# Patient Record
Sex: Female | Born: 1965 | Race: White | Hispanic: No | Smoking: Former smoker
Health system: Southern US, Community
[De-identification: ages and names within clinical notes are randomized; demographics above are authoritative.]

## PROBLEM LIST (undated history)

## (undated) DIAGNOSIS — E559 Vitamin D deficiency, unspecified: Secondary | ICD-10-CM

## (undated) DIAGNOSIS — M1990 Unspecified osteoarthritis, unspecified site: Secondary | ICD-10-CM

## (undated) DIAGNOSIS — E669 Obesity, unspecified: Secondary | ICD-10-CM

## (undated) DIAGNOSIS — R232 Flushing: Secondary | ICD-10-CM

## (undated) DIAGNOSIS — I8001 Phlebitis and thrombophlebitis of superficial vessels of right lower extremity: Secondary | ICD-10-CM

## (undated) DIAGNOSIS — L719 Rosacea, unspecified: Secondary | ICD-10-CM

## (undated) DIAGNOSIS — E785 Hyperlipidemia, unspecified: Secondary | ICD-10-CM

## (undated) DIAGNOSIS — G479 Sleep disorder, unspecified: Secondary | ICD-10-CM

## (undated) DIAGNOSIS — M2550 Pain in unspecified joint: Secondary | ICD-10-CM

## (undated) DIAGNOSIS — E041 Nontoxic single thyroid nodule: Secondary | ICD-10-CM

## (undated) DIAGNOSIS — I8390 Asymptomatic varicose veins of unspecified lower extremity: Secondary | ICD-10-CM

## (undated) DIAGNOSIS — M25569 Pain in unspecified knee: Secondary | ICD-10-CM

## (undated) HISTORY — PX: TUBAL LIGATION: SHX77

## (undated) HISTORY — DX: Asymptomatic varicose veins of unspecified lower extremity: I83.90

## (undated) HISTORY — DX: Hyperlipidemia, unspecified: E78.5

## (undated) HISTORY — DX: Rosacea, unspecified: L71.9

## (undated) HISTORY — PX: VARICOSE VEIN SURGERY: SHX832

## (undated) HISTORY — DX: Unspecified osteoarthritis, unspecified site: M19.90

## (undated) HISTORY — PX: UTERINE FIBROID SURGERY: SHX826

## (undated) HISTORY — DX: Flushing: R23.2

## (undated) HISTORY — DX: Sleep disorder, unspecified: G47.9

## (undated) HISTORY — DX: Pain in unspecified joint: M25.50

## (undated) HISTORY — DX: Vitamin D deficiency, unspecified: E55.9

## (undated) HISTORY — DX: Obesity, unspecified: E66.9

## (undated) HISTORY — DX: Pain in unspecified knee: M25.569

## (undated) HISTORY — DX: Phlebitis and thrombophlebitis of superficial vessels of right lower extremity: I80.01

## (undated) HISTORY — DX: Nontoxic single thyroid nodule: E04.1

---

## 2012-01-14 DIAGNOSIS — I83819 Varicose veins of unspecified lower extremities with pain: Secondary | ICD-10-CM | POA: Insufficient documentation

## 2014-08-02 ENCOUNTER — Other Ambulatory Visit (HOSPITAL_COMMUNITY): Payer: Self-pay | Admitting: Chiropractic Medicine

## 2014-08-02 ENCOUNTER — Ambulatory Visit (HOSPITAL_COMMUNITY)
Admission: RE | Admit: 2014-08-02 | Discharge: 2014-08-02 | Disposition: A | Discharge status: Home / Self Care | Payer: 59 | Source: Ambulatory Visit | Attending: Chiropractic Medicine | Admitting: Chiropractic Medicine

## 2014-08-02 DIAGNOSIS — M5442 Lumbago with sciatica, left side: Secondary | ICD-10-CM | POA: Insufficient documentation

## 2014-09-27 ENCOUNTER — Encounter: Payer: Self-pay | Admitting: *Deleted

## 2014-09-27 ENCOUNTER — Emergency Department
Admission: EM | Admit: 2014-09-27 | Discharge: 2014-09-27 | Disposition: A | Discharge status: Home / Self Care | Payer: 59 | Source: Home / Self Care | Attending: Emergency Medicine | Admitting: Emergency Medicine

## 2014-09-27 DIAGNOSIS — J069 Acute upper respiratory infection, unspecified: Secondary | ICD-10-CM

## 2014-09-27 MED ORDER — AMOXICILLIN 875 MG PO TABS: ORAL_TABLET | ORAL | Status: DC

## 2014-09-27 NOTE — ED Notes (Signed)
Pt c/o nasal congestion, HA, sinus pressure, and fatigue x 3 days. Denies fever.

## 2014-09-27 NOTE — ED Provider Notes (Signed)
CSN: [id number]     Arrival date & time 09/27/14  1644 History   First MD Initiated Contact with Patient 09/27/14 1710     Chief Complaint  Patient presents with  . Headache  . Nasal Congestion   (Consider location/radiation/quality/duration/timing/severity/associated sxs/prior Treatment) HPI URI HISTORY  Alicia Payne is a 48 y.o. female who complains of onset of cold symptoms for 4 days.  Have been using over-the-counter treatment , such as decongestant and expectorant, and that's helping somewhat.  No chills/sweats No definite Fever  +  Nasal congestion Mild Discolored Post-nasal drainage Mild sinus pain/pressure No sore throat  No cough No wheezing No chest congestion No hemoptysis No shortness of breath No pleuritic pain  No itchy/red eyes Mild bilateral earache  No nausea No vomiting No abdominal pain No diarrhea  No skin rashes +  Fatigue No myalgias No headache   History reviewed. No pertinent past medical history. Past Surgical History  Procedure Laterality Date  . Uterine fibroid surgery    . Tubal ligation     Family History  Problem Relation Age of Onset  . Cancer Mother     thyroid   History  Substance Use Topics  . Smoking status: Former Research scientist (life sciences)  . Smokeless tobacco: Not on file  . Alcohol Use: Yes   OB History    No data available     Review of Systems  All other systems reviewed and are negative.   Allergies  Review of patient's allergies indicates no known allergies.  Home Medications   Prior to Admission medications   Medication Sig Start Date End Date Taking? Authorizing Provider  amoxicillin (AMOXIL) 875 MG tablet Take 1 twice a day X 10 days. 09/27/14   Jacqulyn Cane, MD   BP 138/88 mmHg  Pulse 60  Temp(Src) 97.7 F (36.5 C) (Oral)  Resp 18  Ht 5\' 7"  (1.702 m)  Wt 238 lb (107.956 kg)  BMI 37.27 kg/m2  SpO2 98%  LMP 09/15/2014 Physical Exam  Constitutional: She is oriented to person, place, and time. She appears  well-developed and well-nourished. No distress.  HENT:  Head: Normocephalic and atraumatic.  Right Ear: External ear normal.  Left Ear: External ear normal.  Nose: Nose normal.  Mouth/Throat: Oropharynx is clear and moist.  TMs normal. Nose with minimally boggy terminates minimal serous drainage. No maxillary or frontal or facial tenderness  Eyes: Conjunctivae and EOM are normal. Pupils are equal, round, and reactive to light. Right eye exhibits no discharge. Left eye exhibits no discharge. No scleral icterus.  Neck: Normal range of motion. Neck supple.  Cardiovascular: Normal rate, regular rhythm and normal heart sounds.   Pulmonary/Chest: Effort normal and breath sounds normal. No respiratory distress. She has no wheezes. She has no rales.  Abdominal: She exhibits no distension.  Musculoskeletal: Normal range of motion. She exhibits no edema or tenderness.  Lymphadenopathy:    She has no cervical adenopathy.  Neurological: She is alert and oriented to person, place, and time. No cranial nerve deficit.  Skin: Skin is warm. No rash noted.  Psychiatric: She has a normal mood and affect.  Nursing note and vitals reviewed.   ED Course  Procedures (including critical care time) Labs Review Labs Reviewed - No data to display  Imaging Review No results found.   MDM   1. Acute upper respiratory infection    Likely viral. Treatment options discussed, as well as risks, benefits, alternatives. Patient voiced understanding and agreement with the following plans: Discussed symptomatic  care. Continue decongestant and Mucinex and rest and pushing fluids. Tylenol or ibuprofen for pain or fever If not improving in 2-3 days, fill prescription I gave her for amoxicillin. Follow-up with your primary care doctor in 5-7 days if not improving, or sooner if symptoms become worse. Precautions discussed. Red flags discussed. Questions invited and answered. Patient voiced understanding and  agreement.     Jacqulyn Cane, MD 09/27/14 [phone]

## 2014-10-26 ENCOUNTER — Other Ambulatory Visit: Payer: Self-pay | Admitting: Internal Medicine

## 2014-10-26 DIAGNOSIS — E042 Nontoxic multinodular goiter: Secondary | ICD-10-CM

## 2014-10-26 DIAGNOSIS — E041 Nontoxic single thyroid nodule: Secondary | ICD-10-CM

## 2014-11-01 ENCOUNTER — Ambulatory Visit (INDEPENDENT_AMBULATORY_CARE_PROVIDER_SITE_OTHER): Payer: 59

## 2014-11-01 DIAGNOSIS — E041 Nontoxic single thyroid nodule: Secondary | ICD-10-CM

## 2014-12-15 ENCOUNTER — Emergency Department
Admission: EM | Admit: 2014-12-15 | Discharge: 2014-12-15 | Disposition: A | Discharge status: Home / Self Care | Payer: 59 | Source: Home / Self Care | Attending: Family Medicine | Admitting: Family Medicine

## 2014-12-15 ENCOUNTER — Encounter: Payer: Self-pay | Admitting: *Deleted

## 2014-12-15 DIAGNOSIS — H04213 Epiphora due to excess lacrimation, bilateral lacrimal glands: Secondary | ICD-10-CM | POA: Diagnosis not present

## 2014-12-15 MED ORDER — NEDOCROMIL SODIUM 2 % OP SOLN: OPHTHALMIC | Status: DC

## 2014-12-15 NOTE — ED Notes (Signed)
Pt c/o bilateral eye redness and watery x 1 mth. She has tried OTC antihistamines and eye gtts without relief.

## 2014-12-15 NOTE — ED Provider Notes (Signed)
CSN: [id number]     Arrival date & time 12/15/14  1640 History   First MD Initiated Contact with Patient 12/15/14 1655     Chief Complaint  Patient presents with  . Eye Problem      HPI Comments: Patient reports a one month history of persistent excessive lacrimation equally in both eyes, to the extent that tears run down her cheeks.  Recently she has developed itching and mild swelling in her lower eyelids because of the excess tears at night.  She has no eye pain or foreign body sensation.  No recent URI or sinusitis.  ?history of seasonal allergies.  She has had no improvement with OTC vasoconstrictor and antihistamine drops.  She denies facial rash.  Patient is a 49 y.o. female presenting with eye problem. The history is provided by the patient.  Eye Problem Location:  Both Quality:  Tearing Severity:  Moderate Onset quality:  Sudden Duration:  1 month Timing:  Constant Progression:  Worsening Chronicity:  New Relieved by:  Nothing Worsened by:  Nothing tried Ineffective treatments:  Eye drops Associated symptoms: itching and redness   Associated symptoms: no blurred vision, no crusting, no decreased vision, no discharge, no double vision, no facial rash, no foreign body sensation, no headaches, no nausea, no photophobia, no scotomas, no swelling and no tearing   Risk factors: not exposed to pinkeye and no recent URI     History reviewed. No pertinent past medical history. Past Surgical History  Procedure Laterality Date  . Uterine fibroid surgery    . Tubal ligation    . Varicose vein surgery     Family History  Problem Relation Age of Onset  . Cancer Mother     thyroid   History  Substance Use Topics  . Smoking status: Former Research scientist (life sciences)  . Smokeless tobacco: Not on file  . Alcohol Use: Yes   OB History    No data available     Review of Systems  Eyes: Positive for redness and itching. Negative for blurred vision, double vision, photophobia and discharge.    Gastrointestinal: Negative for nausea.  Neurological: Negative for headaches.  All other systems reviewed and are negative.   Allergies  Review of patient's allergies indicates no known allergies.  Home Medications   Prior to Admission medications   Medication Sig Start Date End Date Taking? Authorizing Provider  amoxicillin (AMOXIL) 875 MG tablet Take 1 twice a day X 10 days. 09/27/14   Jacqulyn Cane, MD  nedocromil (ALOCRIL) 2 % ophthalmic solution Apply one or two gtts OU BID 12/15/14   Kandra Nicolas, MD   BP 134/84 mmHg  Pulse 74  Temp(Src) 98.2 F (36.8 C) (Oral)  Resp 18  Ht 5\' 8"  (1.727 m)  Wt 237 lb (107.502 kg)  BMI 36.04 kg/m2  SpO2 97%  LMP 11/20/2014 Physical Exam  Constitutional: She appears well-developed and well-nourished. No distress.  HENT:  Head: Normocephalic.  Nose: Nose normal.  Mouth/Throat: Oropharynx is clear and moist.  Eyes: EOM are normal. Pupils are equal, round, and reactive to light. Right eye exhibits no discharge and no exudate. Left eye exhibits no discharge, no exudate and no hordeolum. No foreign body present in the left eye. Right conjunctiva is injected. Left conjunctiva is injected.    Both eyes have similar, but minimal hyperemia.  No photophobia.  Fundi benign.  No discharge noted. Both lower lids slightly erythematous and swollen but no tenderness to palpation. No distinct facial erythema. Fluorescein  to left eye shows no uptake.  Neck: Neck supple.  Lymphadenopathy:    She has no cervical adenopathy.  Nursing note and vitals reviewed.   ED Course  Procedures  none   MDM   1. Epiphora due to excess lacrimation of both sides; ?etiology.  ?allergic    Begin trial of Alocril ophthalmic solution BID May apply thin film of 1% hydrocortisone cream or ointment to cheeks below eyes to control itching and redness. Followup with ophthalmologist next week as scheduled    Kandra Nicolas, MD 12/15/14 1743

## 2014-12-15 NOTE — Discharge Instructions (Signed)
May apply thin film of 1% hydrocortisone cream or ointment to cheeks below eyes to control itching and redness.

## 2015-06-28 ENCOUNTER — Ambulatory Visit: Payer: Self-pay | Admitting: Allergy and Immunology

## 2015-06-28 ENCOUNTER — Encounter (INDEPENDENT_AMBULATORY_CARE_PROVIDER_SITE_OTHER): Payer: Self-pay

## 2015-06-28 ENCOUNTER — Encounter: Payer: Self-pay | Admitting: Allergy and Immunology

## 2015-06-28 ENCOUNTER — Ambulatory Visit (INDEPENDENT_AMBULATORY_CARE_PROVIDER_SITE_OTHER): Payer: 59 | Admitting: Allergy and Immunology

## 2015-06-28 VITALS — BP 110/82 | HR 80 | Resp 16

## 2015-06-28 DIAGNOSIS — L718 Other rosacea: Secondary | ICD-10-CM

## 2015-06-28 DIAGNOSIS — H1045 Other chronic allergic conjunctivitis: Secondary | ICD-10-CM | POA: Diagnosis not present

## 2015-06-28 DIAGNOSIS — H1010 Acute atopic conjunctivitis, unspecified eye: Secondary | ICD-10-CM | POA: Insufficient documentation

## 2015-06-28 NOTE — Progress Notes (Signed)
Subjective:   Patient ID: Alicia Payne is a 49 y.o. female.   HPI:  Problem  Seasonal Allergic Conjunctivitis   She returns today stating that she once again developed significant redness and watering of eyes while using doxycycline. Alrex did not appear to help. She stopped the doxycycline within a few days of the flare up. No other symptoms    Ocular Rosacea    No past medical history on file.  Past Surgical History  Procedure Laterality Date  . Uterine fibroid surgery    . Tubal ligation    . Varicose vein surgery        Medication List       This list is accurate as of: 06/28/15  6:06 PM.  Always use your most recent med list.               ALREX 0.2 % Susp  Generic drug:  loteprednol  Place 1 drop into both eyes as needed.     amoxicillin 875 MG tablet  Commonly known as:  AMOXIL  Take 1 twice a day X 10 days.     CALCIUM + D PO  Take 1 tablet by mouth daily.     FISH OIL PO  Take 1 capsule by mouth daily.     nedocromil 2 % ophthalmic solution  Commonly known as:  ALOCRIL  Apply one or two gtts OU BID        No Known Allergies  Social History   Social History  . Marital Status: Married    Spouse Name: N/A  . Number of Children: N/A  . Years of Education: N/A   Occupational History  . Not on file.   Social History Main Topics  . Smoking status: Former Research scientist (life sciences)  . Smokeless tobacco: Not on file  . Alcohol Use: Yes  . Drug Use: No  . Sexual Activity: Not on file   Other Topics Concern  . Not on file   Social History Narrative         Review of Systems  HENT: Negative for congestion, ear pain, hearing loss, nosebleeds, sore throat and tinnitus.   Eyes: Negative for redness.  Skin: Negative for itching and rash.  Neurological: Negative for headaches.    Objective:   Filed Vitals:   06/28/15 1732  BP: 110/82  Pulse: 80  Resp: 16    Physical Exam  HENT:  Head: Normocephalic.  Right Ear: External ear normal.   Left Ear: External ear normal.  Nose: Nose normal.  Mouth/Throat: Oropharynx is clear and moist.  Eyes: Right eye exhibits no chemosis, no discharge and no exudate. No foreign body present in the right eye. Left eye exhibits no chemosis, no discharge and no exudate. No foreign body present in the left eye. Right conjunctiva is not injected. Left conjunctiva is not injected. Right eye exhibits normal extraocular motion. Left eye exhibits normal extraocular motion.         Assessment and Plan:   Problem List Items Addressed This Visit      Musculoskeletal and Integument   Ocular rosacea    1. Revisit with eye doctor to assess for dry eye syndrome 2. Continue Doxycycline and alrex. 3. If no dry eye then will consider dermatology visit for further treatment of rosacea.         Other   Seasonal allergic conjunctivitis - Primary    I think the decision point here is whether or not she has dry eye syndrome.  If so, then she needs restasis and if not then she need further treatment for ocular rosacea.   1. Revisit with eye doctor to assess for dry eye syndrome 2. Continue Doxycycline and alrex. 3. If no dry eye then will consider dermatology visit for further treatment of rosacea.

## 2015-06-28 NOTE — Assessment & Plan Note (Signed)
1. Revisit with eye doctor to assess for dry eye syndrome 2. Continue Doxycycline and alrex. 3. If no dry eye then will consider dermatology visit for further treatment of rosacea.

## 2015-06-28 NOTE — Patient Instructions (Signed)
1. Revisit with eye doctor to assess for dry eye syndrome 2. Continue Doxycycline and alrex. 3. If no dry eye then will consider dermatology visit for further treatment of rosacea.

## 2015-06-28 NOTE — Assessment & Plan Note (Signed)
I think the decision point here is whether or not she has dry eye syndrome. If so, then she needs restasis and if not then she need further treatment for ocular rosacea.   1. Revisit with eye doctor to assess for dry eye syndrome 2. Continue Doxycycline and alrex. 3. If no dry eye then will consider dermatology visit for further treatment of rosacea.

## 2015-10-25 DIAGNOSIS — Z23 Encounter for immunization: Secondary | ICD-10-CM | POA: Diagnosis not present

## 2015-10-25 DIAGNOSIS — Z0000 Encounter for general adult medical examination without abnormal findings: Secondary | ICD-10-CM | POA: Diagnosis not present

## 2015-10-25 DIAGNOSIS — Z01419 Encounter for gynecological examination (general) (routine) without abnormal findings: Secondary | ICD-10-CM | POA: Diagnosis not present

## 2015-10-25 DIAGNOSIS — E559 Vitamin D deficiency, unspecified: Secondary | ICD-10-CM | POA: Diagnosis not present

## 2015-10-25 DIAGNOSIS — R635 Abnormal weight gain: Secondary | ICD-10-CM | POA: Diagnosis not present

## 2015-10-27 DIAGNOSIS — R001 Bradycardia, unspecified: Secondary | ICD-10-CM | POA: Diagnosis not present

## 2015-10-31 DIAGNOSIS — Z1231 Encounter for screening mammogram for malignant neoplasm of breast: Secondary | ICD-10-CM | POA: Diagnosis not present

## 2016-03-02 DIAGNOSIS — M79672 Pain in left foot: Secondary | ICD-10-CM | POA: Diagnosis not present

## 2016-03-02 DIAGNOSIS — M792 Neuralgia and neuritis, unspecified: Secondary | ICD-10-CM | POA: Diagnosis not present

## 2016-03-02 DIAGNOSIS — M79671 Pain in right foot: Secondary | ICD-10-CM | POA: Diagnosis not present

## 2016-03-02 DIAGNOSIS — M722 Plantar fascial fibromatosis: Secondary | ICD-10-CM | POA: Diagnosis not present

## 2016-03-02 DIAGNOSIS — B351 Tinea unguium: Secondary | ICD-10-CM | POA: Diagnosis not present

## 2016-03-09 ENCOUNTER — Ambulatory Visit: Payer: 59 | Admitting: Podiatry

## 2016-03-14 DIAGNOSIS — M722 Plantar fascial fibromatosis: Secondary | ICD-10-CM | POA: Diagnosis not present

## 2016-04-04 DIAGNOSIS — B351 Tinea unguium: Secondary | ICD-10-CM | POA: Diagnosis not present

## 2016-05-09 DIAGNOSIS — B351 Tinea unguium: Secondary | ICD-10-CM | POA: Diagnosis not present

## 2016-06-08 DIAGNOSIS — B351 Tinea unguium: Secondary | ICD-10-CM | POA: Diagnosis not present

## 2016-06-25 DIAGNOSIS — M7541 Impingement syndrome of right shoulder: Secondary | ICD-10-CM | POA: Diagnosis not present

## 2016-08-29 DIAGNOSIS — M7541 Impingement syndrome of right shoulder: Secondary | ICD-10-CM | POA: Diagnosis not present

## 2016-09-06 DIAGNOSIS — L719 Rosacea, unspecified: Secondary | ICD-10-CM | POA: Diagnosis not present

## 2016-09-06 DIAGNOSIS — H527 Unspecified disorder of refraction: Secondary | ICD-10-CM | POA: Diagnosis not present

## 2016-09-26 DIAGNOSIS — M7541 Impingement syndrome of right shoulder: Secondary | ICD-10-CM | POA: Diagnosis not present

## 2016-10-29 DIAGNOSIS — L718 Other rosacea: Secondary | ICD-10-CM | POA: Diagnosis not present

## 2016-10-29 DIAGNOSIS — Z0000 Encounter for general adult medical examination without abnormal findings: Secondary | ICD-10-CM | POA: Diagnosis not present

## 2016-10-29 DIAGNOSIS — Z1211 Encounter for screening for malignant neoplasm of colon: Secondary | ICD-10-CM | POA: Diagnosis not present

## 2016-11-05 DIAGNOSIS — Z1231 Encounter for screening mammogram for malignant neoplasm of breast: Secondary | ICD-10-CM | POA: Diagnosis not present

## 2016-11-28 ENCOUNTER — Ambulatory Visit: Payer: 59 | Attending: Orthopedic Surgery | Admitting: Physical Therapy

## 2016-11-28 DIAGNOSIS — M6281 Muscle weakness (generalized): Secondary | ICD-10-CM | POA: Insufficient documentation

## 2016-11-28 DIAGNOSIS — M25511 Pain in right shoulder: Secondary | ICD-10-CM | POA: Insufficient documentation

## 2016-11-28 DIAGNOSIS — G8929 Other chronic pain: Secondary | ICD-10-CM | POA: Diagnosis not present

## 2016-11-28 DIAGNOSIS — R293 Abnormal posture: Secondary | ICD-10-CM | POA: Diagnosis not present

## 2016-11-28 NOTE — Patient Instructions (Signed)
Scapular Retraction (Standing)    With arms at sides, pinch shoulder blades together. Repeat ___10_ times per set. Do ___2_ sets per session. Do __2__ sessions per day.  http://orth.exer.us/945   Copyright  VHI. All rights reserved.

## 2016-11-28 NOTE — Therapy (Signed)
[facility] [place], [place], [address] Phone: [phone]   Fax:  [phone]  Physical Therapy Evaluation  Patient Details  Name: Alicia Payne MRN: [id number] Date of Birth: 21-Dec-1965 Referring Provider: Dr. Justice Britain   Encounter Date: 11/28/2016      PT End of Session - 11/28/16 1509    Visit Number 1   Number of Visits 16   Date for PT Re-Evaluation 01/23/17   PT Start Time [id number]   PT Stop Time 1515   PT Time Calculation (min) 40 min   Activity Tolerance Patient tolerated treatment well   Behavior During Therapy [facility] for tasks assessed/performed      No past medical history on file.  Past Surgical History:  Procedure Laterality Date  . TUBAL LIGATION    . UTERINE FIBROID SURGERY    . VARICOSE VEIN SURGERY      There were no vitals filed for this visit.       Subjective Assessment - 11/28/16 1437    Subjective Patient had gradual onset on Rt. shoulder pain which began last summer.  She tried meds, cortisone treatment and did not have lasting relief. She avoids overuse with Rt. UE.  She has difficulty sleeping and making adjustments for comfort.  Denies weakness and sensory changes.  Pain can radiate to upper arm.  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     Pertinent History not remarkable   Limitations Lifting;Writing;Other (comment);House hold activities   Diagnostic tests XR neg.    Currently in Pain? Yes  none at rest    Pain Score 4    Pain Location Shoulder   Pain Orientation Right   Pain Descriptors / Indicators Tightness   Pain Type Chronic pain   Pain Radiating Towards upper arm    Pain Onset More than a month ago   Pain Frequency Intermittent   Aggravating Factors  reaching back, across, malposition   Pain Relieving Factors positioning, medicine/ointment, has not used ice or heat    Effect of Pain on Daily  Activities work, comfort            [facility] PT Assessment - 11/28/16 1447      Assessment   Medical Diagnosis Rt. shoulder impingement    Referring Provider Dr. Justice Britain    Onset Date/Surgical Date --  Summer 2017   Hand Dominance Right   Next MD Visit unknown   Prior Therapy No      Precautions   Precautions None     Restrictions   Weight Bearing Restrictions No     Balance Screen   Has the patient fallen in the past 6 months No     [place] residence     Prior Function   Level of Independence Independent   Vocation Full time employment   Vocation Requirements light patient care, lifting    Leisure walking, reading      Cognition   Overall Cognitive Status Within Functional Limits for tasks assessed     Observation/Other Assessments   Focus on Therapeutic Outcomes (FOTO)  NT     Sensation   Light Touch Appears Intact     Posture/Postural Control   Posture/Postural Control Postural limitations   Postural Limitations Rounded Shoulders;Forward head   Posture Comments Rt shoulder lower and IR , scapula abductred from midline     AROM  Overall AROM Comments Functional reach : ER lower cervical and IR T6, L.t UE WNL, pain with both positions on Rt.   painful Adduction horizonal   Right Shoulder Extension --  pain    Right Shoulder Flexion 135 Degrees   Right Shoulder ABduction 140 Degrees   Right Shoulder Internal Rotation 60 Degrees  pain   Right Shoulder External Rotation 60 Degrees  pain   Left Shoulder Flexion 150 Degrees   Left Shoulder ABduction 150 Degrees     Strength   Right Shoulder Flexion 4+/5   Right Shoulder ABduction 4+/5   Right Shoulder Internal Rotation 4+/5   Right Shoulder External Rotation 4/5   Left Shoulder Flexion 4+/5   Left Shoulder ABduction 4+/5   Left Shoulder Internal Rotation 5/5   Left Shoulder External Rotation 5/5     Palpation   Palpation comment TTP anterior shoulder and  at [facility] joint                          Empty Can test   Findings Positive   Side Right                           PT Education - 11/28/16 2107    Education provided Yes   Education Details PT/POC, posture, impingement, anatomy   Person(s) Educated Patient   Methods Explanation;Demonstration;Tactile cues;Verbal cues;Handout   Comprehension Verbalized understanding;Returned demonstration          PT Short Term Goals - 11/28/16 2116      PT SHORT TERM GOAL #1   Title Pt will be I with HEP for posture, Rt. UE strength and ROM     Time 4   Period Weeks   Status New     PT SHORT TERM GOAL #2   Title Pt will report modification of sleep, positioning and work/home activities to reduce pain and inflammation   Time 4   Period Weeks   Status New     PT SHORT TERM GOAL #3   Title Pt will understand RICE and posture to reduce pain.   Time 4   Period Weeks   Status New           PT Long Term Goals - 11/28/16 2118      PT LONG TERM GOAL #1   Title Pt will be I with more advanced HEP    Time 8   Period Weeks   Status New     PT LONG TERM GOAL #2   Title Pt will be able to reach overhead without increasing pain as needed at work and home.     Time 8   Period Weeks   Status New     PT LONG TERM GOAL #3   Title Pt will be able to reach behind her back to hook undergarments without increase pain.    Time 8   Period Weeks   Status New     PT LONG TERM GOAL #4   Title Pt will have 5/5 strength in Rt. UE (including ER) for maximal shoulder function.    Time 8   Period Weeks   Status New               Plan - 11/28/16 2107    Clinical Impression Statement Patient presents for low complexity eval of Rt shoulder pain, consistent with impingement.  She has pain with combined extension and  internal rotation, reaching over shoulder height. She has weakness in rotator cuff.  She does have poor posture, weak periscapular mm and reports poor positiioning  (sleep with hands behind head or on her stomach).  She will do well with changing positioning, being mindful of posture and corrective exercises to improve shoulder function.    Rehab Potential Excellent   PT Frequency 2x / week   PT Duration 8 weeks   PT Treatment/Interventions ADLs/Self Care Home Management;Moist Heat;Dry needling;Therapeutic exercise;Ultrasound;Manual techniques;Taping;Neuromuscular re-education;Cryotherapy;Electrical Stimulation;Iontophoresis 4mg /ml Dexamethasone;Functional mobility training;Passive range of motion;Patient/family education   PT Next Visit Plan establish HEP, posture, manual to Rt. supraspinatus, consider [place] or ionto    PT Home Exercise Plan posture/scapular retraction    Consulted and Agree with Plan of Care Patient      Patient will benefit from skilled therapeutic intervention in order to improve the following deficits and impairments:  Decreased strength, Postural dysfunction, Decreased range of motion, Increased fascial restricitons, Obesity, Impaired UE functional use, Pain, Impaired flexibility, Decreased mobility  Visit Diagnosis: Chronic right shoulder pain  Abnormal posture  Muscle weakness (generalized)     Problem List Patient Active Problem List   Diagnosis Date Noted  . Seasonal allergic conjunctivitis 06/28/2015  . Ocular rosacea 06/28/2015    Trinda Harlacher 11/28/2016, 9:24 PM  [facility] [address] [place], [place], [address] Phone: [phone]   Fax:  [phone]  Name: Alicia Payne MRN: [id number] Date of Birth: 19-Aug-1966  Raeford Razor, PT 11/28/16 9:25 PM Phone: [phone] Fax: [phone]

## 2016-12-05 ENCOUNTER — Ambulatory Visit: Payer: 59 | Admitting: Physical Therapy

## 2016-12-06 ENCOUNTER — Ambulatory Visit: Payer: 59 | Attending: Orthopedic Surgery | Admitting: Physical Therapy

## 2016-12-06 ENCOUNTER — Encounter: Payer: Self-pay | Admitting: Physical Therapy

## 2016-12-06 DIAGNOSIS — R293 Abnormal posture: Secondary | ICD-10-CM | POA: Insufficient documentation

## 2016-12-06 DIAGNOSIS — M6281 Muscle weakness (generalized): Secondary | ICD-10-CM | POA: Insufficient documentation

## 2016-12-06 DIAGNOSIS — G8929 Other chronic pain: Secondary | ICD-10-CM | POA: Diagnosis not present

## 2016-12-06 DIAGNOSIS — M25511 Pain in right shoulder: Secondary | ICD-10-CM | POA: Diagnosis not present

## 2016-12-06 NOTE — Therapy (Signed)
[place], [place], [address] Phone: [phone]   Fax:  [phone]  Physical Therapy Treatment  Patient Details  Name: Alicia Payne MRN: [id number] Date of Birth: November 12, 1965 Referring Provider: Dr. Justice Britain   Encounter Date: 12/06/2016      PT End of Session - 12/06/16 1744    Visit Number 2   Number of Visits 16   Date for PT Re-Evaluation 01/23/17   PT Start Time 1017   PT Stop Time 1635   PT Time Calculation (min) 48 min   Behavior During Therapy [facility] for tasks assessed/performed      History reviewed. No pertinent past medical history.  Past Surgical History:  Procedure Laterality Date  . TUBAL LIGATION    . UTERINE FIBROID SURGERY    . VARICOSE VEIN SURGERY      There were no vitals filed for this visit.      Subjective Assessment - 12/06/16 1556    Subjective No changes.    Currently in Pain? Yes   Pain Score 4    Pain Location Shoulder   Pain Orientation Right   Pain Descriptors / Indicators --  tightness   Pain Type Chronic pain   Pain Radiating Towards upper arm   Pain Frequency Intermittent   Pain Relieving Factors change of position, medicine- Aleve about 2 X a week,  ointment   Effect of Pain on Daily Activities limits sleeping, work                         [employer] Adult PT Treatment/Exercise - 12/06/16 0001      Self-Care   Self-Care --  importance of good posture, Impingemt shoulder     Ultrasound   Ultrasound Location right shoulder   Ultrasound Parameters 100%,  1.5 watts/cm2   Ultrasound Goals Pain     Manual Therapy   Manual Therapy Soft tissue mobilization;Taping   Manual therapy comments instrument assist intermittant.   Soft tissue mobilization Right shoulder   Kinesiotex Inhibit Muscle;Facilitate Muscle;Ligament Correction     Kinesiotix   Inhibit Muscle  deltoid,  supraspinatus   Ligament Correction posture correction                 PT Education - 12/06/16 1744    Education provided Yes   Education Details impingement,  posture importance   Person(s) Educated Patient   Methods Explanation   Comprehension Verbalized understanding          PT Short Term Goals - 11/28/16 2116      PT SHORT TERM GOAL #1   Title Pt will be I with HEP for posture, Rt. UE strength and ROM     Time 4   Period Weeks   Status New     PT SHORT TERM GOAL #2   Title Pt will report modification of sleep, positioning and work/home activities to reduce pain and inflammation   Time 4   Period Weeks   Status New     PT SHORT TERM GOAL #3   Title Pt will understand RICE and posture to reduce pain.   Time 4   Period Weeks   Status New           PT Long Term Goals - 11/28/16 2118      PT LONG TERM GOAL #1   Title Pt will be I with more advanced HEP    Time 8  Period Weeks   Status New     PT LONG TERM GOAL #2   Title Pt will be able to reach overhead without increasing pain as needed at work and home.     Time 8   Period Weeks   Status New     PT LONG TERM GOAL #3   Title Pt will be able to reach behind her back to hook undergarments without increase pain.    Time 8   Period Weeks   Status New     PT LONG TERM GOAL #4   Title Pt will have 5/5 strength in Rt. UE (including ER) for maximal shoulder function.    Time 8   Period Weeks   Status New               Plan - 12/06/16 1745    Clinical Impression Statement Focus today on pain control.   Trial of USm manual and tape.  No changes yet noted.   PT Next Visit Plan assess treatment   repeat if needed/helpful.,, scapular rows,  doorway stretch.     PT Home Exercise Plan posture/scapular retraction    Consulted and Agree with Plan of Care Patient      Patient will benefit from skilled therapeutic intervention in order to improve the following deficits and impairments:  Decreased strength, Postural dysfunction, Decreased range of  motion, Increased fascial restricitons, Obesity, Impaired UE functional use, Pain, Impaired flexibility, Decreased mobility  Visit Diagnosis: Chronic right shoulder pain  Abnormal posture  Muscle weakness (generalized)     Problem List Patient Active Problem List   Diagnosis Date Noted  . Seasonal allergic conjunctivitis 06/28/2015  . Ocular rosacea 06/28/2015    Macarthur Lorusso PTA 12/06/2016, 5:49 PM  [facility] [address] [place], [place], [address] Phone: [phone]   Fax:  [phone]  Name: Alicia Payne MRN: [id number] Date of Birth: 12/23/1965

## 2016-12-11 ENCOUNTER — Ambulatory Visit: Payer: 59 | Admitting: Physical Therapy

## 2016-12-11 ENCOUNTER — Encounter: Payer: Self-pay | Admitting: Physical Therapy

## 2016-12-11 DIAGNOSIS — M25511 Pain in right shoulder: Principal | ICD-10-CM

## 2016-12-11 DIAGNOSIS — M6281 Muscle weakness (generalized): Secondary | ICD-10-CM

## 2016-12-11 DIAGNOSIS — G8929 Other chronic pain: Secondary | ICD-10-CM | POA: Diagnosis not present

## 2016-12-11 DIAGNOSIS — R293 Abnormal posture: Secondary | ICD-10-CM | POA: Diagnosis not present

## 2016-12-11 NOTE — Therapy (Signed)
[place], [place], [address] Phone: [phone]   Fax:  [phone]  Physical Therapy Treatment  Patient Details  Name: Alicia Payne MRN: [id number] Date of Birth: [birth year] Referring Provider: Dr. Justice Britain   Encounter Date: 12/11/2016      PT End of Session - 12/11/16 1652    Visit Number 3   Number of Visits 16   Date for PT Re-Evaluation 01/23/17   PT Start Time 1500   PT Stop Time 1545   PT Time Calculation (min) 45 min   Activity Tolerance Patient tolerated treatment well   Behavior During Therapy [facility] for tasks assessed/performed      History reviewed. No pertinent past medical history.  Past Surgical History:  Procedure Laterality Date  . TUBAL LIGATION    . UTERINE FIBROID SURGERY    . VARICOSE VEIN SURGERY      There were no vitals filed for this visit.      Subjective Assessment - 12/11/16 1507    Subjective Pain has been a little softer since the last visit.    Tape lasted until Sunday.  Looser   Currently in Pain? Yes                         [facility] Adult PT Treatment/Exercise - 12/11/16 0001      Shoulder Exercises: Standing   External Rotation Limitations 3 X green band,  pain increased so stopped. (Both)   Row Strengthening;Both;10 reps;Theraband   Theraband Level (Shoulder Row) Level 3 (Green)   Row Limitations HEP after instruction     Shoulder Exercises: Isometric Strengthening   Flexion Limitations 10 X 5 seconds  cued for less pressure to decrease pain   Extension Limitations 10 x 5 seconds   External Rotation Limitations 10 x 5 seconds  cued decreased pressure to decrease pain   Internal Rotation Limitations 10 x 5 seconds,  cued for less pressure to decrease pain     Shoulder Exercises: Stretch   External Rotation Stretch 3 reps;30 seconds  right,  HEP issued from exercise drawer     Ultrasound   Ultrasound Location right  shoulder   Ultrasound Parameters 100%  1.5 watts/cm2   Ultrasound Goals Pain                PT Education - 12/11/16 1556    Education provided Yes   Education Details HEP   Person(s) Educated Patient   Methods Explanation;Demonstration;Verbal cues;Handout   Comprehension Verbalized understanding;Returned demonstration          PT Short Term Goals - 12/11/16 1655      PT SHORT TERM GOAL #1   Title Pt will be I with HEP for posture, Rt. UE strength and ROM     Baseline independent with exercises issued so far   Time 4   Period Weeks   Status On-going     PT SHORT TERM GOAL #2   Title Pt will report modification of sleep, positioning and work/home activities to reduce pain and inflammation   Baseline Sitting in car posture improving   Time 4   Period Weeks   Status On-going     PT SHORT TERM GOAL #3   Title Pt will understand RICE and posture to reduce pain.   Baseline working on [employer].  RICE not assessed4   Time 4   Period Weeks   Status On-going  PT Long Term Goals - 11/28/16 2118      PT LONG TERM GOAL #1   Title Pt will be I with more advanced HEP    Time 8   Period Weeks   Status New     PT LONG TERM GOAL #2   Title Pt will be able to reach overhead without increasing pain as needed at work and home.     Time 8   Period Weeks   Status New     PT LONG TERM GOAL #3   Title Pt will be able to reach behind her back to hook undergarments without increase pain.    Time 8   Period Weeks   Status New     PT LONG TERM GOAL #4   Title Pt will have 5/5 strength in Rt. UE (including ER) for maximal shoulder function.    Time 8   Period Weeks   Status New               Plan - 12/11/16 1653    Clinical Impression Statement Shoulder is feeling a little better.  Progressed her toward her HEP goals today.  [place] helpful.  Patient was considering cancelling her appointment tomorrow,  but was open to soft tissue work,  gentle stretching.   (May be sore after exercises today)   PT Next Visit Plan soft tissue work,  stretches.  Issue RICE info.   PT Home Exercise Plan posture/scapular retraction    Consulted and Agree with Plan of Care Patient      Patient will benefit from skilled therapeutic intervention in order to improve the following deficits and impairments:  Decreased strength, Postural dysfunction, Decreased range of motion, Increased fascial restricitons, Obesity, Impaired UE functional use, Pain, Impaired flexibility, Decreased mobility  Visit Diagnosis: Chronic right shoulder pain  Abnormal posture  Muscle weakness (generalized)     Problem List Patient Active Problem List   Diagnosis Date Noted  . Seasonal allergic conjunctivitis 06/28/2015  . 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 [place], [place], [address] Phone: [phone]   Fax:  [phone]  Name: Alicia Payne MRN: [id number] Date of Birth: Jan 24, 1966

## 2016-12-11 NOTE — Patient Instructions (Addendum)
Strengthening: Isometric Flexion  Using wall for resistance, press right fist into ball using light pressure. Hold __5-10__ seconds. Repeat ___10_ times per set. Do _1___ sessions per day.  SHOULDER: Abduction (Isometric)  Use wall as resistance. Press arm against pillow. Keep elbow straight. Hold ___ seconds. ___ reps per set, ___ sets per day, ___ days per week  Extension (Isometric)  Place left bent elbow and back of arm against wall. Press elbow against wall. Hold __5-10__ seconds. Repeat _10___ times. Do ___1_ sessions per day.  Internal Rotation (Isometric)  Place palm of right fist against door frame, with elbow bent. Press fist against door frame. Hold 5-10____ seconds. Repeat ___10times. Do _1___ sessions per day.  External Rotation (Isometric)  Place back of left fist against door frame, with elbow bent. Press fist against door frame. Hold _5-10___ seconds. Repeat __10__ times. Do 1____ sessions per day.  Copyright  VHI. All rights reserved.   Pain free.     Also green band  Rows.  10 X daily,  Working up to 30 x Issued from exercise drawer  ER stretch 3 X 30, single arm,  daily

## 2016-12-12 ENCOUNTER — Ambulatory Visit: Payer: 59 | Admitting: Physical Therapy

## 2016-12-12 DIAGNOSIS — G8929 Other chronic pain: Secondary | ICD-10-CM | POA: Diagnosis not present

## 2016-12-12 DIAGNOSIS — M25511 Pain in right shoulder: Secondary | ICD-10-CM | POA: Diagnosis not present

## 2016-12-12 DIAGNOSIS — R293 Abnormal posture: Secondary | ICD-10-CM

## 2016-12-12 DIAGNOSIS — M6281 Muscle weakness (generalized): Secondary | ICD-10-CM | POA: Diagnosis not present

## 2016-12-12 NOTE — Patient Instructions (Signed)
Posterior Capsule Sleeper Stretch, Side-Lying    Lie on side, pillow under head, neck in neutral, underside arm in 90-90 of shoulder and elbow flexion with scapula fixed to table. Use other hand to press back of underside arm forward and downward. Keep elbow angle. Hold _10__ seconds.  Repeat _5-10__ times per session. Do __2_ sessions per day. Copyright  VHI. All rights reserved.   Flexibility: Corner Stretch    Standing in corner with hands just above shoulder level and feet __12__ inches from corner, lean forward until a comfortable stretch is felt across chest. Hold __30__ seconds. Repeat ___3_ times per set. Do ____1 sets per session. Do ___2-3_ sessions per day. Slide hands up or down to make it comfortable for your shoulder.  http://orth.exer.us/343   Copyright  VHI. All rights reserved.

## 2016-12-12 NOTE — Therapy (Signed)
[place], [place], [address] Phone: [phone]   Fax:  [phone]  Physical Therapy Treatment  Patient Details  Name: Alicia Payne MRN: [id number] Date of Birth: 23-May-1966 Referring Provider: Dr. Justice Britain   Encounter Date: 12/12/2016      PT End of Session - 12/12/16 1456    Visit Number 4   Number of Visits 16   PT Start Time 3893   PT Stop Time 1505   PT Time Calculation (min) 49 min   Activity Tolerance Patient tolerated treatment well   Behavior During Therapy [facility] for tasks assessed/performed      No past medical history on file.  Past Surgical History:  Procedure Laterality Date  . TUBAL LIGATION    . UTERINE FIBROID SURGERY    . VARICOSE VEIN SURGERY      There were no vitals filed for this visit.      Subjective Assessment - 12/12/16 1423    Subjective No pain at rest, did not sleep well last night.  It hurts when I reach out too far.            [facility] Adult PT Treatment/Exercise - 12/12/16 1508      Posture/Postural Control   Posture Comments needs wall to remain upright (avoid swayback) with overhead lifting     Shoulder Exercises: Standing   Flexion AAROM;Right;Limitations   Shoulder Flexion Weight (lbs) UE Ranger reaching axross, out and up in various pattens, cues for scap position   Other Standing Exercises retraction x 10 x 2 sets against wall with ball behind back    Other Standing Exercises scapular stab against wall: ER/IR green, overhead x 6     Shoulder Exercises: Stretch   Corner Stretch 3 reps;30 seconds   Other Shoulder Stretches sleeper stretch x 5, Rt side much tighter post capsule than L.      Manual Therapy   Manual Therapy Joint mobilization;Scapular mobilization;Passive ROM   Joint Mobilization A/P on humeral head   Soft tissue mobilization periscap, Rt. upper trap and levator scap, ant deltoid   tol deep pressure well    Scapular  Mobilization upward rotation    Passive ROM all planes                 PT Education - 12/12/16 1459    Education provided Yes   Education Details trigger points, massage, capsule stretch and HEP , posture    Person(s) Educated Patient   Methods Explanation;Demonstration;Handout;Tactile cues   Comprehension Verbalized understanding;Returned demonstration          PT Short Term Goals - 12/11/16 1655      PT SHORT TERM GOAL #1   Title Pt will be I with HEP for posture, Rt. UE strength and ROM     Baseline independent with exercises issued so far   Time 4   Period Weeks   Status On-going     PT SHORT TERM GOAL #2   Title Pt will report modification of sleep, positioning and work/home activities to reduce pain and inflammation   Baseline Sitting in car posture improving   Time 4   Period Weeks   Status On-going     PT SHORT TERM GOAL #3   Title Pt will understand RICE and posture to reduce pain.   Baseline working on [employer].  RICE not assessed4   Time 4   Period Weeks   Status On-going  PT Long Term Goals - 11/28/16 2118      PT LONG TERM GOAL #1   Title Pt will be I with more advanced HEP    Time 8   Period Weeks   Status New     PT LONG TERM GOAL #2   Title Pt will be able to reach overhead without increasing pain as needed at work and home.     Time 8   Period Weeks   Status New     PT LONG TERM GOAL #3   Title Pt will be able to reach behind her back to hook undergarments without increase pain.    Time 8   Period Weeks   Status New     PT LONG TERM GOAL #4   Title Pt will have 5/5 strength in Rt. UE (including ER) for maximal shoulder function.    Time 8   Period Weeks   Status New               Plan - 12/12/16 1511    Clinical Impression Statement Worked on postural correction today for optimal joint position and alignment with reaching.  Pain increased min with exercises but none post.  No order back for ionto.    PT  Next Visit Plan soft tissue work,  stretches.  Issue RICE info.   PT Home Exercise Plan posture/scapular retraction , isometrics, row, corner stretch and sleeper stretch    Consulted and Agree with Plan of Care Patient      Patient will benefit from skilled therapeutic intervention in order to improve the following deficits and impairments:  Decreased strength, Postural dysfunction, Decreased range of motion, Increased fascial restricitons, Obesity, Impaired UE functional use, Pain, Impaired flexibility, Decreased mobility  Visit Diagnosis: Chronic right shoulder pain  Abnormal posture  Muscle weakness (generalized)     Problem List Patient Active Problem List   Diagnosis Date Noted  . Seasonal allergic conjunctivitis 06/28/2015  . 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 [place], [place], [address] Phone: [phone]   Fax:  [phone]  Name: Ronesha Heenan MRN: [id number] Date of Birth: 05-21-1966  Raeford Razor, PT 12/12/16 3:15 PM Phone: [phone] Fax: [phone]

## 2016-12-17 ENCOUNTER — Ambulatory Visit: Payer: 59 | Admitting: Physical Therapy

## 2016-12-20 ENCOUNTER — Ambulatory Visit: Payer: 59 | Admitting: Physical Therapy

## 2016-12-24 ENCOUNTER — Ambulatory Visit: Payer: 59 | Admitting: Physical Therapy

## 2016-12-24 ENCOUNTER — Encounter: Payer: Self-pay | Admitting: Physical Therapy

## 2016-12-24 DIAGNOSIS — M6281 Muscle weakness (generalized): Secondary | ICD-10-CM | POA: Diagnosis not present

## 2016-12-24 DIAGNOSIS — M25511 Pain in right shoulder: Secondary | ICD-10-CM | POA: Diagnosis not present

## 2016-12-24 DIAGNOSIS — G8929 Other chronic pain: Secondary | ICD-10-CM | POA: Diagnosis not present

## 2016-12-24 DIAGNOSIS — R293 Abnormal posture: Secondary | ICD-10-CM | POA: Diagnosis not present

## 2016-12-24 NOTE — Therapy (Signed)
[place], [place], [address] Phone: [phone]   Fax:  [phone]  Physical Therapy Treatment  Patient Details  Name: Alicia Payne MRN: [id number] Date of Birth: 04-02-1966 Referring Provider: Dr. Justice Britain   Encounter Date: 12/24/2016      PT End of Session - 12/24/16 1753    Visit Number 4   Number of Visits 16   Date for PT Re-Evaluation 01/23/17   PT Start Time 4097   PT Stop Time 1630   PT Time Calculation (min) 43 min   Activity Tolerance Patient tolerated treatment well   Behavior During Therapy [facility] for tasks assessed/performed      History reviewed. No pertinent past medical history.  Past Surgical History:  Procedure Laterality Date  . TUBAL LIGATION    . UTERINE FIBROID SURGERY    . VARICOSE VEIN SURGERY      There were no vitals filed for this visit.      Subjective Assessment - 12/24/16 1551    Subjective No real changes yet                         [facility] Adult PT Treatment/Exercise - 12/24/16 0001      Shoulder Exercises: Isometric Strengthening   Flexion Limitations 10 X 5   Extension Limitations 10 X 5   External Rotation Limitations 10 x 5     Manual Therapy   Manual Therapy Joint mobilization;Scapular mobilization;Passive ROM   Manual therapy comments myofascial work anterior shoulder   Joint Mobilization A/P on humeral head   Soft tissue mobilization periscap, Rt. upper trap and levator scap, ant deltoid   tol deep pressure well    Scapular Mobilization upward rotation    Passive ROM all planes                   PT Short Term Goals - 12/24/16 1757      PT SHORT TERM GOAL #1   Title Pt will be I with HEP for posture, Rt. UE strength and ROM     Baseline independent with exercises issued so far   Time 4   Period Weeks   Status On-going     PT SHORT TERM GOAL #2   Title Pt will report modification of sleep,  positioning and work/home activities to reduce pain and inflammation   Baseline Sitting in car posture improving,  stretches to improve posture at work,     Time 4   Period Weeks   Status Partially Met     PT SHORT TERM GOAL #3   Title Pt will understand RICE and posture to reduce pain.   Baseline working on [employer].  RICE not assessed4   Time 4   Period Weeks   Status On-going           PT Long Term Goals - 11/28/16 2118      PT LONG TERM GOAL #1   Title Pt will be I with more advanced HEP    Time 8   Period Weeks   Status New     PT LONG TERM GOAL #2   Title Pt will be able to reach overhead without increasing pain as needed at work and home.     Time 8   Period Weeks   Status New     PT LONG TERM GOAL #3   Title Pt will be able to reach behind her  back to hook undergarments without increase pain.    Time 8   Period Weeks   Status New     PT LONG TERM GOAL #4   Title Pt will have 5/5 strength in Rt. UE (including ER) for maximal shoulder function.    Time 8   Period Weeks   Status New               Plan - 12/24/16 1754    Clinical Impression Statement Patient missed last 2 session.  [place] office cancelled the wrong session and so no visits last week.  patient was frustrated.  No real lasting changed yet noted.  Gentle strengthening and manual were focus of today's session.  Patient was able to reach opposite shoulder with less stiffness post session.  She declined the need of modalities. Posture improved.     PT Next Visit Plan soft tissue work,  stretches.  Issue RICE info.   PT Home Exercise Plan posture/scapular retraction , isometrics, row, corner stretch and sleeper stretch Check specific goals.    Consulted and Agree with Plan of Care Patient      Patient will benefit from skilled therapeutic intervention in order to improve the following deficits and impairments:  Decreased strength, Postural dysfunction, Decreased range of motion, Increased  fascial restricitons, Obesity, Impaired UE functional use, Pain, Impaired flexibility, Decreased mobility  Visit Diagnosis: Chronic right shoulder pain  Abnormal posture  Muscle weakness (generalized)     Problem List Patient Active Problem List   Diagnosis Date Noted  . Seasonal allergic conjunctivitis 06/28/2015  . 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

## 2016-12-25 ENCOUNTER — Ambulatory Visit: Payer: 59 | Admitting: Physical Therapy

## 2016-12-27 ENCOUNTER — Ambulatory Visit: Payer: 59 | Admitting: Physical Therapy

## 2016-12-27 DIAGNOSIS — M25511 Pain in right shoulder: Principal | ICD-10-CM

## 2016-12-27 DIAGNOSIS — M6281 Muscle weakness (generalized): Secondary | ICD-10-CM | POA: Diagnosis not present

## 2016-12-27 DIAGNOSIS — R293 Abnormal posture: Secondary | ICD-10-CM

## 2016-12-27 DIAGNOSIS — G8929 Other chronic pain: Secondary | ICD-10-CM | POA: Diagnosis not present

## 2016-12-27 NOTE — Therapy (Signed)
Alicia Payne, [place], [address] Phone: [phone]   Fax:  [phone]  Physical Therapy Treatment  Patient Details  Name: [name] MRN: [id number] Date of Birth: [birth year] Referring Provider: Dr. Justice Britain   Encounter Date: 12/27/2016      PT End of Session - 12/27/16 1635    Visit Number 5   Number of Visits 16   Date for PT Re-Evaluation 01/23/17   PT Start Time 1505   PT Stop Time 1545   PT Time Calculation (min) 40 min   Activity Tolerance Patient tolerated treatment well   Behavior During Therapy [facility] for tasks assessed/performed      No past medical history on file.  Past Surgical History:  Procedure Laterality Date  . TUBAL LIGATION    . UTERINE FIBROID SURGERY    . VARICOSE VEIN SURGERY      There were no vitals filed for this visit.      Subjective Assessment - 12/27/16 1500    Subjective Reaching back is better but not reaching across.  Pain is OK if I don't reach too far.  Uses L hand at work.     Currently in Pain? No/denies           [facility] Adult PT Treatment/Exercise - 12/27/16 1509      Shoulder Exercises: Supine   Protraction AAROM;Strengthening;Both;10 reps   Horizontal ABduction AAROM;Strengthening;Both;10 reps   Flexion AAROM;Strengthening;Both;10 reps   Other Supine Exercises above ex done on foam roller    Other Supine Exercises supine scapular stab ex : yellow back x 8, overhead and horiz pull      Shoulder Exercises: Stretch   Other Shoulder Stretches sleeper stretch x 5,    Other Shoulder Stretches thoracic ext over chair      Modalities   Modalities (P)  Iontophoresis     Manual Therapy   Manual therapy comments myofascial work anterior shoulder   Soft tissue mobilization periscap, Rt. upper trap and levator scap, ant deltoid   tol deep pressure well    Passive ROM trigger points in Rt. Upper trap             PT Education -  12/27/16 1635    Education provided Yes   Education Details ionto , foam roller ex   Person(s) Educated Patient   Methods Explanation   Comprehension Verbalized understanding          PT Short Term Goals - 12/24/16 1757      PT SHORT TERM GOAL #1   Title Pt will be I with HEP for posture, Rt. UE strength and ROM     Baseline independent with exercises issued so far   Time 4   Period Weeks   Status On-going     PT SHORT TERM GOAL #2   Title Pt will report modification of sleep, positioning and work/home activities to reduce pain and inflammation   Baseline Sitting in car posture improving,  stretches to improve posture at work,     Time 4   Period Weeks   Status Partially Met     PT SHORT TERM GOAL #3   Title Pt will understand RICE and posture to reduce pain.   Baseline working on [employer].  RICE not assessed4   Time 4   Period Weeks   Status On-going           PT Long Term Goals - 12/27/16 1501  PT LONG TERM GOAL #1   Title Pt will be I with more advanced HEP    Status On-going     PT LONG TERM GOAL #2   Title Pt will be able to reach overhead without increasing pain as needed at work and home.     Baseline min pain    Status Partially Met     PT LONG TERM GOAL #3   Title Pt will be able to reach behind her back to hook undergarments without increase pain.    Baseline easier than before    Status On-going     PT LONG TERM GOAL #4   Title Pt will have 5/5 strength in Rt. UE (including ER) for maximal shoulder function.    Status On-going               Plan - 12/27/16 1636    Clinical Impression Statement Pt doing better, has more AROM with functional reaching.  Pain just with adduction.  Cont to have scapular weakness and poor posture, although she is more cognizant with functional activities.  Trial of ionto patch.     PT Next Visit Plan soft tissue work,  stretches.  Issue RICE info.   PT Home Exercise Plan posture/scapular retraction ,  isometrics, row, corner stretch and sleeper stretch    Consulted and Agree with Plan of Care Patient      Patient will benefit from skilled therapeutic intervention in order to improve the following deficits and impairments:  Decreased strength, Postural dysfunction, Decreased range of motion, Increased fascial restricitons, Obesity, Impaired UE functional use, Pain, Impaired flexibility, Decreased mobility  Visit Diagnosis: Chronic right shoulder pain  Abnormal posture  Muscle weakness (generalized)     Problem List Patient Active Problem List   Diagnosis Date Noted  . Seasonal allergic conjunctivitis 06/28/2015  . 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

## 2016-12-27 NOTE — Patient Instructions (Signed)
IONTOPHORESIS PATIENT PRECAUTIONS & CONTRAINDICATIONS:  . Redness under one or both electrodes can occur.  This characterized by a uniform redness that usually disappears within 12 hours of treatment. . Small pinhead size blisters may result in response to the drug.  Contact your physician if the problem persists more than 24 hours. . On rare occasions, iontophoresis therapy can result in temporary skin reactions such as rash, inflammation, irritation or burns.  The skin reactions may be the result of individual sensitivity to the ionic solution used, the condition of the skin at the start of treatment, reaction to the materials in the electrodes, allergies or sensitivity to dexamethasone, or a poor connection between the patch and your skin.  Discontinue using iontophoresis if you have any of these reactions and report to your therapist. . Remove the Patch or electrodes if you have any undue sensation of pain or burning during the treatment and report discomfort to your therapist. . Tell your Therapist if you have had known adverse reactions to the application of electrical current. . If using the Patch, the LED light will turn off when treatment is complete and the patch can be removed.  Approximate treatment time is 1-3 hours.  Remove the patch when light goes off or after 6 hours. . The Patch can be worn during normal activity, however excessive motion where the electrodes have been placed can cause poor contact between the skin and the electrode or uneven electrical current resulting in greater risk of skin irritation. Marland Kitchen Keep out of the reach of children.   . DO NOT use if you have a cardiac pacemaker or any other electrically sensitive implanted device. . DO NOT use if you have a known sensitivity to dexamethasone. . DO NOT use during Magnetic Resonance Imaging (MRI). . DO NOT use over broken or compromised skin (e.g. sunburn, cuts, or acne) due to the increased risk of skin reaction. . DO  NOT SHAVE over the area to be treated:  To establish good contact between the Patch and the skin, excessive hair may be clipped. . DO NOT place the Patch or electrodes on or over your eyes, directly over your heart, or brain. . DO NOT reuse the Patch or electrodes as this may cause burns to occur.    Over Head Pull: Narrow Grip       On back, knees bent, feet flat, band across thighs, elbows straight but relaxed. Pull hands apart (start). Keeping elbows straight, bring arms up and over head, hands toward floor. Keep pull steady on band. Hold momentarily. Return slowly, keeping pull steady, back to start. Repeat ___ times. Band color ______   Side Pull: Double Arm   On back, knees bent, feet flat. Arms perpendicular to body, shoulder level, elbows straight but relaxed. Pull arms out to sides, elbows straight. Resistance band comes across collarbones, hands toward floor. Hold momentarily. Slowly return to starting position. Repeat ___ times. Band color _____   Sash   On back, knees bent, feet flat, left hand on left hip, right hand above left. Pull right arm DIAGONALLY (hip to shoulder) across chest. Bring right arm along head toward floor. Hold momentarily. Slowly return to starting position. Repeat ___ times. Do with left arm. Band color ______   Shoulder Rotation: Double Arm   On back, knees bent, feet flat, elbows tucked at sides, bent 90, hands palms up. Pull hands apart and down toward floor, keeping elbows near sides. Hold momentarily. Slowly return to starting position. Repeat  ___ times. Band color ______

## 2017-01-02 ENCOUNTER — Ambulatory Visit: Payer: 59 | Admitting: Physical Therapy

## 2017-01-03 ENCOUNTER — Ambulatory Visit: Payer: 59 | Admitting: Physical Therapy

## 2017-01-08 ENCOUNTER — Ambulatory Visit: Payer: 59 | Attending: Orthopedic Surgery | Admitting: Physical Therapy

## 2017-01-08 DIAGNOSIS — M25511 Pain in right shoulder: Secondary | ICD-10-CM | POA: Insufficient documentation

## 2017-01-08 DIAGNOSIS — M6281 Muscle weakness (generalized): Secondary | ICD-10-CM | POA: Insufficient documentation

## 2017-01-08 DIAGNOSIS — R293 Abnormal posture: Secondary | ICD-10-CM | POA: Diagnosis not present

## 2017-01-08 DIAGNOSIS — G8929 Other chronic pain: Secondary | ICD-10-CM | POA: Diagnosis not present

## 2017-01-08 NOTE — Therapy (Addendum)
Alicia Payne, [place], [address] Phone: [phone]   Fax:  [phone]  Physical Therapy Treatment and Discharge Addendum   Patient Details  Name: Alicia Payne MRN: [id number] Date of Birth: 04/26/1966 Referring Provider: Dr. Justice Britain   Encounter Date: 01/08/2017      PT End of Session - 01/08/17 2123    Visit Number 6   Number of Visits 16   Date for PT Re-Evaluation 01/23/17   PT Start Time 1504   PT Stop Time 1549   PT Time Calculation (min) 45 min   Activity Tolerance Patient tolerated treatment well   Behavior During Therapy [facility] for tasks assessed/performed      No past medical history on file.  Past Surgical History:  Procedure Laterality Date  . TUBAL LIGATION    . UTERINE FIBROID SURGERY    . VARICOSE VEIN SURGERY      There were no vitals filed for this visit.      Subjective Assessment - 01/08/17 1506    Subjective There are times when it feels better than other times it is not.  Reaching across hurts.    Currently in Pain? No/denies             [facility] Adult PT Treatment/Exercise - 01/08/17 1514      Shoulder Exercises: Supine   External Rotation Strengthening;Both;15 reps   Theraband Level (Shoulder External Rotation) Level 3 (Green)   Other Supine Exercises supine scapular stab ex : green back x 10, overhead and horiz pull      Shoulder Exercises: Sidelying   Other Sidelying Exercises upper trunk rotation Rt and Lt x 5 each side, tighter on Rt. anterior shoulder      Cryotherapy   Number Minutes Cryotherapy 10 Minutes   Cryotherapy Location Shoulder   Type of Cryotherapy Ice pack     Electrical Stimulation   Electrical Stimulation Location Rt. superior ant shoulder    Electrical Stimulation Action IFC   Electrical Stimulation Parameters to tol (10)   Electrical Stimulation Goals Pain     Iontophoresis   Type of Iontophoresis Dexamethasone   Location Rt. shoulder   Dose 6 hr patch    Time 1 cc     Manual Therapy   Joint Mobilization inferior and post capsule stretch, seated arm propped in abd    Soft tissue mobilization periscap, Rt. upper trap and levator scap, ant deltoid   tol deep pressure well                 PT Education - 01/08/17 2122    Education provided Yes   Education Details impingement, stretching and IFC home tens    Person(s) Educated Patient   Methods Explanation   Comprehension Verbalized understanding          PT Short Term Goals - 01/08/17 2126      PT SHORT TERM GOAL #1   Title Pt will be I with HEP for posture, Rt. UE strength and ROM     Status Partially Met     PT SHORT TERM GOAL #2   Title Pt will report modification of sleep, positioning and work/home activities to reduce pain and inflammation   Status Partially Met     PT SHORT TERM GOAL #3   Title Pt will understand RICE and posture to reduce pain.   Status Achieved           PT Long Term Goals -  12/27/16 1501      PT LONG TERM GOAL #1   Title Pt will be I with more advanced HEP    Status On-going     PT LONG TERM GOAL #2   Title Pt will be able to reach overhead without increasing pain as needed at work and home.     Baseline min pain    Status Partially Met     PT LONG TERM GOAL #3   Title Pt will be able to reach behind her back to hook undergarments without increase pain.    Baseline easier than before    Status On-going     PT LONG TERM GOAL #4   Title Pt will have 5/5 strength in Rt. UE (including ER) for maximal shoulder function.    Status On-going               Plan - 01/08/17 2123    Clinical Impression Statement Pt with pain becoming more intermittent.  Needed cues for HEP.  Cont with pain reaching across her body and heavier pushing/pulling.  Trial of IFC as she was curious ot see if it would help her.    PT Next Visit Plan post capsule stretching, ER, scap strength.  ice, Home TENS?  patch    PT Home Exercise Plan posture/scapular retraction , isometrics, row, corner stretch and sleeper stretch    Consulted and Agree with Plan of Care Patient      Patient will benefit from skilled therapeutic intervention in order to improve the following deficits and impairments:  Decreased strength, Postural dysfunction, Decreased range of motion, Increased fascial restricitons, Obesity, Impaired UE functional use, Pain, Impaired flexibility, Decreased mobility  Visit Diagnosis: Chronic right shoulder pain  Abnormal posture  Muscle weakness (generalized)     Problem List Patient Active Problem List   Diagnosis Date Noted  . Seasonal allergic conjunctivitis 06/28/2015  . 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 Mild weakness, upper crossed postural syndrome.    Remaining deficits: See above.    Education / Equipment: Posture, RICE, tape, HEP , impingement  Plan: Patient agrees to discharge.  Patient goals were partially met. Patient is being discharged due to financial reasons.  ?????    Insurance issues.    Raeford Razor, PT 01/30/17 10:26 AM Phone: [phone] Fax: [phone]

## 2017-01-10 ENCOUNTER — Ambulatory Visit: Payer: 59 | Admitting: Physical Therapy

## 2017-01-15 ENCOUNTER — Ambulatory Visit: Payer: 59 | Admitting: Physical Therapy

## 2017-01-17 ENCOUNTER — Ambulatory Visit: Payer: 59 | Admitting: Physical Therapy

## 2017-02-06 ENCOUNTER — Encounter: Payer: Self-pay | Admitting: Physician Assistant

## 2017-02-06 ENCOUNTER — Other Ambulatory Visit: Payer: Self-pay

## 2017-02-06 ENCOUNTER — Ambulatory Visit (INDEPENDENT_AMBULATORY_CARE_PROVIDER_SITE_OTHER): Payer: 59 | Admitting: Physician Assistant

## 2017-02-06 VITALS — BP 124/86 | HR 76 | Wt 251.0 lb

## 2017-02-06 DIAGNOSIS — E669 Obesity, unspecified: Secondary | ICD-10-CM | POA: Insufficient documentation

## 2017-02-06 DIAGNOSIS — Z6838 Body mass index (BMI) 38.0-38.9, adult: Secondary | ICD-10-CM | POA: Diagnosis not present

## 2017-02-06 DIAGNOSIS — Z7689 Persons encountering health services in other specified circumstances: Secondary | ICD-10-CM

## 2017-02-06 DIAGNOSIS — L03115 Cellulitis of right lower limb: Secondary | ICD-10-CM | POA: Diagnosis not present

## 2017-02-06 DIAGNOSIS — E782 Mixed hyperlipidemia: Secondary | ICD-10-CM

## 2017-02-06 DIAGNOSIS — R2241 Localized swelling, mass and lump, right lower limb: Secondary | ICD-10-CM

## 2017-02-06 MED ORDER — DOXYCYCLINE HYCLATE 100 MG PO TABS: 100.0000 mg | ORAL_TABLET | Freq: Two times a day (BID) | ORAL | 0 refills | Status: AC

## 2017-02-06 NOTE — Progress Notes (Signed)
HPI:                                                                Alicia Payne is a 51 y.o. female who presents to [place]: [place] today to establish care   Current Concerns include "bump in right leg"  Patient reports a firm, bump that developed 5 days ago on right inner thigh just above the knee. She also noticed a round area of redness that has been gradually getting larger. She denies any fever, chills, pain, or itching. She has been taking Ibuprofen for this. She has a history of varicose veins bilaterally, but notes this feels different. She denies any history of blood dyscrasias or blood clots. No recent immobility, surgery or trauma.   Health Maintenance Health Maintenance  Topic Date Due  . HIV Screening  07/04/1980  . PAP SMEAR  07/04/1986  . COLONOSCOPY  07/05/2015  . INFLUENZA VACCINE  05/01/2017  . TETANUS/TDAP  07/01/2018  . MAMMOGRAM  11/05/2018    GYN/Sexual Health  Menstrual status: postmenopausal  Last pap smear: 10/25/15  History of abno/rmal pap smears: no  Sexually active: not currently  Current contraception: none  Mammogram 11/05/2016   Past Medical History:  Diagnosis Date  . Hyperlipidemia   . Obesity   . Rosacea   . Varicose vein of leg   . Vitamin D deficiency    Past Surgical History:  Procedure Laterality Date  . TUBAL LIGATION    . UTERINE FIBROID SURGERY    . VARICOSE VEIN SURGERY     Social History  Substance Use Topics  . Smoking status: Former Research scientist (life sciences)  . Smokeless tobacco: Not on file  . Alcohol use Yes   family history includes Cancer in her mother.  ROS: negative except as noted in the HPI  Medications: Current Outpatient Prescriptions  Medication Sig Dispense Refill  . Calcium Citrate-Vitamin D (CALCIUM + D PO) Take 1 tablet by mouth daily.    . Omega-3 Fatty Acids (FISH OIL PO) Take 1 capsule by mouth daily.    Marland Kitchen doxycycline (VIBRA-TABS) 100 MG tablet Take 1  tablet (100 mg total) by mouth 2 (two) times daily. 14 tablet 0   No current facility-administered medications for this visit.    No Known Allergies   Objective:  BP 124/86   Pulse 76   Wt 251 lb (113.9 kg)   LMP  (Within Years)   BMI 38.16 kg/m  Gen: well-groomed, obese, cooperative, not ill-appearing, no distress Pulm: Normal work of breathing, normal phonation, clear to auscultation bilaterally CV: Normal rate, regular rhythm, s1 and s2 distinct, no murmurs, clicks or rubs, no carotid bruit Neuro: alert and oriented x 3, EOM's intact, normal tone, no tremor MSK: calves nontender, moving all extremities, normal gait and station, no peripheral edema Skin: warm and dry, 11 cm x 13 cm area of erythema with central area of induration on the right medial distal thigh, nontender, bilateral varicosities of the lower extremities Psych: normal affect, euthymic mood, normal speech and thought content  Depression screen PHQ 2/9 02/06/2017  Decreased Interest 1  Down, Depressed, Hopeless 0  PHQ - 2 Score 1     Assessment and Plan: 51 y.o. female with  Encounter  to establish care - Cologuard ordered for colon cancer screening - Pap and Mammogram UTD  Cellulitis of right lower extremity - will treat empirically as a cellulitis and order vascular US to r/o DVT - doxycycline (VIBRA-TABS) 100 MG tablet; Take 1 tablet (100 mg total) by mouth 2 (two) times daily.  Dispense: 14 tablet; Refill: 0 - follow-up in 2 weeks  Localized swelling, mass, or lump of right lower extremity - Wells score 1, low risk - VAS [place] LOWER EXTREMITY VENOUS (DVT); Future  Class 2 severe obesity due to excess calories with serious comorbidity and body mass index (BMI) of 38.0 to 38.9 in adult [facility]) - discussed healthy lifestyle changes including MyFitnessPal app  - discussed medication options, patient is going to research these and make a follow-up appointment  Mixed hyperlipidemia - 10 yr ASCVD risk 2.1% -  recommend baby asa daily  Patient education and anticipatory guidance given Patient agrees with treatment plan Follow-up in 2 weeks or sooner as needed  Darlyne Russian PA-C

## 2017-02-06 NOTE — Patient Instructions (Addendum)
Start taking Baby Aspirin (81mg ) daily to decrease risk of heart attack/stroke  For weight loss, research the following medications and schedule a follow-up appointment 1. Qsymia 2. Belviq 3. Contrave 4. Saxenda  For colon cancer screeningL - contact your insurance company about Cologuard to find out what your out-of-pocket cost will be  Physical Activity Recommendations for modifying lipids and lowering blood pressure Engage in aerobic physical activity to reduce LDL-cholesterol, non-HDL-cholesterol, and blood pressure  Frequency: 3-4 sessions per week  Intensity: moderate to vigorous  Duration: 40 minutes on average  Physical Activity Recommendations for secondary prevention 1. Aerobic exercise  Frequency: 3-5 sessions per week  Intensity: 50-80% capacity  Duration: 20 - 60 minutes  Examples: walking, treadmill, cycling, rowing, stair climbing, and arm/leg ergometry  2. Resistance exercise  Frequency: 2-3 sessions per week  Intensity: 10-15 repetitions/set to moderate fatigue  Duration: 1-3 sets of 8-10 upper and lower body exercises  Examples: calisthenics, elastic bands, cuff/hand weights, dumbbels, free weights, wall pulleys, and weight machines  Heart-Healthy Lifestyle  Eating a diet rich in vegetables, fruits and whole grains: also includes low-fat dairy products, poultry, fish, legumes, and nuts; limit intake of sweets, sugar-sweetened beverages and red meats  Getting regular exercise  Maintaining a healthy weight  Not smoking or getting help quitting  Staying on top of your health; for some people, lifestyle changes alone may not be enough to prevent a heart attack or stroke. In these cases, taking a statin at the right dose will most likely be necessary

## 2017-02-07 ENCOUNTER — Ambulatory Visit: Payer: 59 | Admitting: Physician Assistant

## 2017-02-07 ENCOUNTER — Encounter: Payer: Self-pay | Admitting: Physician Assistant

## 2017-02-08 ENCOUNTER — Ambulatory Visit (HOSPITAL_COMMUNITY)
Admission: RE | Admit: 2017-02-08 | Discharge: 2017-02-08 | Disposition: A | Discharge status: Home / Self Care | Payer: 59 | Source: Ambulatory Visit | Attending: Cardiovascular Disease | Admitting: Cardiovascular Disease

## 2017-02-08 DIAGNOSIS — R2241 Localized swelling, mass and lump, right lower limb: Secondary | ICD-10-CM | POA: Diagnosis not present

## 2017-02-08 DIAGNOSIS — M7989 Other specified soft tissue disorders: Secondary | ICD-10-CM | POA: Diagnosis not present

## 2017-02-11 ENCOUNTER — Telehealth: Payer: Self-pay | Admitting: Physician Assistant

## 2017-02-11 ENCOUNTER — Telehealth: Payer: Self-pay

## 2017-02-11 DIAGNOSIS — I8001 Phlebitis and thrombophlebitis of superficial vessels of right lower extremity: Secondary | ICD-10-CM

## 2017-02-11 MED ORDER — AMBULATORY NON FORMULARY MEDICATION: 0 refills | Status: AC

## 2017-02-11 MED ORDER — NAPROXEN 500 MG PO TABS: 500.0000 mg | ORAL_TABLET | Freq: Two times a day (BID) | ORAL | 0 refills | Status: DC

## 2017-02-11 NOTE — Telephone Encounter (Signed)
-----   Message from [facility], [place] sent at 02/11/2017  8:18 AM EDT ----- Let patient know ultrasound showed a superficial infection of her veins. I sent a prescription NSAID and compression stockings to her [facility] pharmacy and would like her to come in for follow-up this week. She can discontinue the antibiotic.

## 2017-02-11 NOTE — Telephone Encounter (Signed)
[place] of RLE significant for superficial thrombophlebitis.  Prescription sent for Naprosyn 500mg  BID and compression stockings. Patient to follow-up in clinic this week.

## 2017-02-11 NOTE — Telephone Encounter (Signed)
-----   Message from Charlaine Dalton, RVT sent at 02/08/2017  4:04 PM EDT ----- Regarding: Lower Extremity Venous Duplex Today's right lower extremity venous duplex is negative for DVT. There are some thrombosed varicose veins in the medial right distal thigh.

## 2017-02-11 NOTE — Telephone Encounter (Signed)
Left vm for pt to return call to clinic -EH/RMA  

## 2017-02-13 NOTE — Telephone Encounter (Signed)
Pt notified of results and recommendations -EH/RMA

## 2017-02-13 NOTE — Telephone Encounter (Signed)
-----   Message from [facility], [place] sent at 02/13/2017  9:57 AM EDT ----- She has 2 options - She can purchase a thigh sleeve, which only apply compression to the affected area. We recommend body helix brand and if you go their website ([email or web address]) they have instructions on how to measure your leg to order the correct size - I can send a prescription for thigh high compression stockings

## 2017-02-13 NOTE — Telephone Encounter (Signed)
At pt request she was notified through [facility]. -EH/RMA

## 2017-03-20 ENCOUNTER — Encounter: Payer: Self-pay | Admitting: Physician Assistant

## 2017-03-20 DIAGNOSIS — I8001 Phlebitis and thrombophlebitis of superficial vessels of right lower extremity: Secondary | ICD-10-CM | POA: Insufficient documentation

## 2017-03-20 HISTORY — DX: Phlebitis and thrombophlebitis of superficial vessels of right lower extremity: I80.01

## 2017-04-30 ENCOUNTER — Emergency Department (INDEPENDENT_AMBULATORY_CARE_PROVIDER_SITE_OTHER)
Admission: EM | Admit: 2017-04-30 | Discharge: 2017-04-30 | Disposition: A | Discharge status: Home / Self Care | Payer: 59 | Source: Home / Self Care | Attending: Family Medicine | Admitting: Family Medicine

## 2017-04-30 DIAGNOSIS — L723 Sebaceous cyst: Secondary | ICD-10-CM | POA: Diagnosis not present

## 2017-04-30 DIAGNOSIS — Z5189 Encounter for other specified aftercare: Secondary | ICD-10-CM | POA: Diagnosis not present

## 2017-04-30 MED ORDER — DOXYCYCLINE HYCLATE 100 MG PO CAPS: 100.0000 mg | ORAL_CAPSULE | Freq: Two times a day (BID) | ORAL | 0 refills | Status: DC

## 2017-04-30 NOTE — ED Triage Notes (Signed)
Has had knot on the side of the neck for about 2 weeks.  Getting larger.  Denies fever.  Some drainage.

## 2017-04-30 NOTE — ED Provider Notes (Signed)
[facility]    CSN: [id number] Arrival date & time: 04/30/17  1533     History   Chief Complaint Chief Complaint  Patient presents with  . Abscess    right neck    HPI Alicia Payne is a 51 y.o. female.   Patient complains of a painful nodule on her right neck for about two weeks that has been increasing in size.  She had had a small nodule in the distant past that had improved.   The history is provided by the patient.  Abscess  Abscess location: right lateral neck. Size:  2cm Abscess quality: fluctuance, painful and redness   Abscess quality: not draining and not weeping   Red streaking: no   Duration:  2 weeks Progression:  Worsening Pain details:    Quality:  Dull   Severity:  Mild   Duration:  2 weeks   Timing:  Constant   Progression:  Worsening Chronicity:  Recurrent Context: skin injury   Relieved by:  Nothing Exacerbated by: contact. Ineffective treatments:  None tried   Past Medical History:  Diagnosis Date  . Hyperlipidemia   . Obesity   . Rosacea   . Varicose vein of leg   . Vitamin D deficiency     Patient Active Problem List   Diagnosis Date Noted  . Phlebitis and thrombophlebitis of superficial vessels of right lower extremity 03/20/2017  . Mixed hyperlipidemia 02/06/2017  . Class 2 severe obesity due to excess calories with serious comorbidity and body mass index (BMI) of 38.0 to 38.9 in adult ([place]) 02/06/2017  . Seasonal allergic conjunctivitis 06/28/2015  . 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 Varicose veins of leg with pain 01/14/2012    Past Surgical History:  Procedure Laterality Date  . TUBAL LIGATION    . UTERINE FIBROID SURGERY    . VARICOSE VEIN SURGERY      OB History    Gravida Para Term Preterm AB Living   2 2       2    SAB TAB Ectopic Multiple Live Births                   Home Medications    Prior to Admission medications   Medication Sig Start Date End Date Taking? Authorizing Provider    AMBULATORY NON FORMULARY MEDICATION Knee-high, medium compression, graduated compression stockings. Apply to lower extremities. [email or web address], Zippered Compression Stockings, large circ, long length 02/11/17   Trixie Dredge, PA-C  Calcium Citrate-Vitamin D (CALCIUM + D PO) Take 1 tablet by mouth daily.    [provider]  doxycycline (VIBRAMYCIN) 100 MG capsule Take 1 capsule (100 mg total) by mouth 2 (two) times daily. Take with food. 04/30/17   Kandra Nicolas, MD  naproxen (NAPROSYN) 500 MG tablet Take 1 tablet (500 mg total) by mouth 2 (two) times daily with a meal. 02/11/17   Trixie Dredge, PA-C  Omega-3 Fatty Acids (FISH OIL PO) Take 1 capsule by mouth daily.    [provider]    Family History Family History  Problem Relation Age of Onset  . Cancer Mother        thyroid    Social History Social History  Substance Use Topics  . Smoking status: Former Research scientist (life sciences)  . Smokeless tobacco: Not on file  . Alcohol use Yes     Allergies   Patient has no known allergies.   Review of Systems Review of Systems  All other systems reviewed  and are negative.    Physical Exam Triage Vital Signs ED Triage Vitals  Enc Vitals Group     BP      Pulse      Resp      Temp      Temp src      SpO2      Weight      Height      Head Circumference      Peak Flow      Pain Score      Pain Loc      Pain Edu?      Excl. in [place]?    No data found.   Updated Vital Signs BP 124/81 (BP Location: Left Arm)   Pulse 72   Temp 98 F (36.7 C) (Oral)   Ht 5\' 8"  (1.727 m)   Wt 253 lb (114.8 kg)   LMP 11/20/2014   SpO2 96%   BMI 38.47 kg/m   Visual Acuity Right Eye Distance:   Left Eye Distance:   Bilateral Distance:    Right Eye Near:   Left Eye Near:    Bilateral Near:     Physical Exam  Constitutional: She appears well-developed and well-nourished. No distress.  HENT:  Head: Normocephalic.  Right Ear: External ear normal.   Left Ear: External ear normal.  Nose: Nose normal.  Mouth/Throat: Oropharynx is clear and moist.  Eyes: Pupils are equal, round, and reactive to light.  Neck: Neck supple.    2cm fluctuant erythematous cystic nodule right lateral neck as noted on diagram.   Cardiovascular: Normal rate.   Pulmonary/Chest: Effort normal.  Neurological: She is alert.  Skin: Skin is warm and dry.  Nursing note and vitals reviewed.    UC Treatments / Results  Labs (all labs ordered are listed, but only abnormal results are displayed) Labs Reviewed  WOUND CULTURE    EKG  EKG Interpretation None       Radiology No results found.  Procedures Procedures Incise and drain cyst/abscess. Risks and benefits of procedure explained to patient and verbal consent obtained.  Using sterile technique and local anesthesia with topical refrigerant and 1% lidocaine with epinephrine, cleansed affected area with Betadine and alcohol. Identified the most fluctuant area of lesion and incised with #11 blade.  Expressed blood and purulent material.  Inserted Iodoform gauze packing.  Bandage applied.  Patient tolerated well   Medications Ordered in UC Medications - No data to display   Initial Impression / Assessment and Plan / UC Course  I have reviewed the triage vital signs and the nursing notes.  Pertinent labs & imaging results that were available during my care of the patient were reviewed by me and considered in my medical decision making (see chart for details).    Wound culture pending.  Begin empiric doxycycline. Leave bandage in place until follow-up visit tomorrow.  Keep bandage clean and dry.  May take ibuprofen or Aleve if needed for pain. Return tomorrow for follow-up and packing removal.    Final Clinical Impressions(s) / UC Diagnoses   Final diagnoses:  Sebaceous cyst    New Prescriptions New Prescriptions   DOXYCYCLINE (VIBRAMYCIN) 100 MG CAPSULE    Take 1 capsule (100 mg total) by  mouth 2 (two) times daily. Take with food.     Kandra Nicolas, MD 04/30/17 [phone]

## 2017-04-30 NOTE — Discharge Instructions (Signed)
Leave bandage in place until follow-up visit tomorrow.  Keep bandage clean and dry.  May take ibuprofen or Aleve if needed for pain.

## 2017-05-01 ENCOUNTER — Emergency Department (INDEPENDENT_AMBULATORY_CARE_PROVIDER_SITE_OTHER)
Admission: EM | Admit: 2017-05-01 | Discharge: 2017-05-01 | Disposition: A | Discharge status: Home / Self Care | Payer: 59 | Source: Home / Self Care | Attending: Family Medicine | Admitting: Family Medicine

## 2017-05-01 ENCOUNTER — Encounter: Payer: Self-pay | Admitting: *Deleted

## 2017-05-01 DIAGNOSIS — Z4800 Encounter for change or removal of nonsurgical wound dressing: Secondary | ICD-10-CM

## 2017-05-01 DIAGNOSIS — Z5189 Encounter for other specified aftercare: Secondary | ICD-10-CM

## 2017-05-01 NOTE — ED Triage Notes (Signed)
Pt is here today for a recheck of abscess on the RT side of her neck.

## 2017-05-01 NOTE — ED Provider Notes (Signed)
Vitals:   05/01/17 1517  Temp: 97.9 F (36.6 C)    Alicia Payne is a 51 y.o. female presenting to UC for wound recheck and packing removal from sebaceous cyst that was I&D yesterday at this UC.  Pt has been taking antibiotics as prescribed w/o complication.  Pain has improved since yesterday. Denies fever, n/v/d.  Wound on Right side of neck, packing still in place. Surrounding erythema and induration. No active drainage or bleeding. Packing removed w/o immediate complication, replaced with new packing.  Wound appears to be healing well.  F/u in 1-2 days for packing removal and wound recheck. Continue taking antibiotic as prescribed.  NO CHARGE. Pt had I&D performed at this facility on 04/30/17. Visit within 10 day global period.      Alicia Payne, [place] 05/01/17 1525

## 2017-05-03 LAB — WOUND CULTURE
Gram Stain: NONE SEEN
Gram Stain: NONE SEEN
Gram Stain: NONE SEEN
Organism ID, Bacteria: NO GROWTH

## 2017-05-04 ENCOUNTER — Telehealth: Payer: Self-pay | Admitting: Emergency Medicine

## 2017-05-04 NOTE — Telephone Encounter (Signed)
courtesy call to patient, negative wound Cx. Left message

## 2017-08-13 DIAGNOSIS — M25561 Pain in right knee: Secondary | ICD-10-CM | POA: Diagnosis not present

## 2017-08-13 DIAGNOSIS — M25562 Pain in left knee: Secondary | ICD-10-CM | POA: Diagnosis not present

## 2017-09-12 ENCOUNTER — Telehealth: Payer: Self-pay | Admitting: Physician Assistant

## 2017-09-12 NOTE — Telephone Encounter (Signed)
Called pt and talk to her daughter--update the pharmacy [facility]--[id number], [place] [employer]

## 2018-02-17 ENCOUNTER — Encounter: Payer: Self-pay | Admitting: Physician Assistant

## 2018-02-17 ENCOUNTER — Ambulatory Visit (INDEPENDENT_AMBULATORY_CARE_PROVIDER_SITE_OTHER): Payer: No Typology Code available for payment source | Admitting: Physician Assistant

## 2018-02-17 VITALS — BP 121/84 | HR 62 | Temp 98.4°F | Wt 249.0 lb

## 2018-02-17 DIAGNOSIS — J0101 Acute recurrent maxillary sinusitis: Secondary | ICD-10-CM

## 2018-02-17 MED ORDER — DOXYCYCLINE HYCLATE 100 MG PO TABS: 100.0000 mg | ORAL_TABLET | Freq: Two times a day (BID) | ORAL | 0 refills | Status: AC

## 2018-02-17 NOTE — Progress Notes (Signed)
HPI:                                                                Alicia Payne is a 52 y.o. female who presents to [facility] [place] today for left-sided facial pain  Patient reports 2 weeks of recurrent left-sided facial/upper dental pain. Denies fevers, nasal congestion, sinus pressure, rhinorrhea, cough.  She initially went to her dentist because she thought she had a cavity or dental infection. States she had dental x-rays and dentist told her everything was negative, but he treated her with Amoxicillin. Symptoms initially improved with Amoxicillin but recurred.  Depression screen PHQ 2/9 02/06/2017  Decreased Interest 1  Down, Depressed, Hopeless 0  PHQ - 2 Score 1    No flowsheet data found.    Past Medical History:  Diagnosis Date  . Hyperlipidemia   . Obesity   . Rosacea   . Varicose vein of leg   . Vitamin D deficiency    Past Surgical History:  Procedure Laterality Date  . TUBAL LIGATION    . UTERINE FIBROID SURGERY    . VARICOSE VEIN SURGERY     Social History   Tobacco Use  . Smoking status: Former Research scientist (life sciences)  . Smokeless tobacco: Never Used  Substance Use Topics  . Alcohol use: Yes   family history includes Cancer in her mother.    ROS: negative except as noted in the HPI  Medications: Current Outpatient Medications  Medication Sig Dispense Refill  . AMBULATORY NON FORMULARY MEDICATION Knee-high, medium compression, graduated compression stockings. Apply to lower extremities. [email or web address], Zippered Compression Stockings, large circ, long length 1 each 0  . Calcium Citrate-Vitamin D (CALCIUM + D PO) Take 1 tablet by mouth daily.    . naproxen (NAPROSYN) 500 MG tablet Take 1 tablet (500 mg total) by mouth 2 (two) times daily with a meal. 30 tablet 0  . Omega-3 Fatty Acids (FISH OIL PO) Take 1 capsule by mouth daily.     No current facility-administered medications for this visit.    No  Known Allergies     Objective:  BP 121/84   Pulse 62   Temp 98.4 F (36.9 C) (Oral)   Wt 249 lb (112.9 kg)   LMP 11/20/2014   BMI 37.86 kg/m  Gen:  alert, not ill-appearing, no distress, appropriate for age 40: head normocephalic without obvious abnormality, conjunctiva and cornea clear, TM's pearly gray and semi-transparent, nasal mucosa pink, oropharynx clear, mild tenderness of the left maxillary sinus, neck supple, no cervical adenopathy, trachea midline Pulm: Normal work of breathing, normal phonation CV: Normal rate, regular rhythm, s1 and s2 distinct, no murmurs, clicks or rubs  Neuro: alert and oriented x 3, no tremor MSK: extremities atraumatic, normal gait and station Skin: intact, no rashes on exposed skin, no jaundice, no cyanosis Psych: well-groomed, cooperative, good eye contact, euthymic mood, affect mood-congruent, speech is articulate, and thought processes clear and goal-directed    No results found for this or any previous visit (from the past 72 hour(s)). No results found.    Assessment and Plan: 52 y.o. female with   Acute recurrent maxillary sinusitis - Plan: doxycycline (VIBRA-TABS) 100 MG tablet - failed Amoxicillin x 10 days. Treating empirically with  Doxycycline. If symptoms persist or recur, will need to confirm diagnosis of sinus disease with imaging    Patient education and anticipatory guidance given Patient agrees with treatment plan Follow-up as needed if symptoms worsen or fail to improve  Darlyne Russian PA-C

## 2018-02-17 NOTE — Patient Instructions (Signed)

## 2018-03-10 ENCOUNTER — Encounter: Payer: Self-pay | Admitting: Physician Assistant

## 2018-03-10 ENCOUNTER — Ambulatory Visit (INDEPENDENT_AMBULATORY_CARE_PROVIDER_SITE_OTHER): Payer: No Typology Code available for payment source | Admitting: Physician Assistant

## 2018-03-10 VITALS — BP 118/83 | HR 73 | Wt 245.0 lb

## 2018-03-10 DIAGNOSIS — Z1329 Encounter for screening for other suspected endocrine disorder: Secondary | ICD-10-CM | POA: Diagnosis not present

## 2018-03-10 DIAGNOSIS — Z13220 Encounter for screening for lipoid disorders: Secondary | ICD-10-CM

## 2018-03-10 DIAGNOSIS — B351 Tinea unguium: Secondary | ICD-10-CM

## 2018-03-10 DIAGNOSIS — Z1231 Encounter for screening mammogram for malignant neoplasm of breast: Secondary | ICD-10-CM

## 2018-03-10 DIAGNOSIS — Z130 Encounter for screening for diseases of the blood and blood-forming organs and certain disorders involving the immune mechanism: Secondary | ICD-10-CM

## 2018-03-10 DIAGNOSIS — N951 Menopausal and female climacteric states: Secondary | ICD-10-CM | POA: Diagnosis not present

## 2018-03-10 DIAGNOSIS — Z1211 Encounter for screening for malignant neoplasm of colon: Secondary | ICD-10-CM | POA: Diagnosis not present

## 2018-03-10 DIAGNOSIS — E041 Nontoxic single thyroid nodule: Secondary | ICD-10-CM

## 2018-03-10 DIAGNOSIS — E559 Vitamin D deficiency, unspecified: Secondary | ICD-10-CM | POA: Insufficient documentation

## 2018-03-10 DIAGNOSIS — Z131 Encounter for screening for diabetes mellitus: Secondary | ICD-10-CM | POA: Diagnosis not present

## 2018-03-10 DIAGNOSIS — E782 Mixed hyperlipidemia: Secondary | ICD-10-CM

## 2018-03-10 DIAGNOSIS — Z0000 Encounter for general adult medical examination without abnormal findings: Secondary | ICD-10-CM | POA: Diagnosis not present

## 2018-03-10 HISTORY — DX: Nontoxic single thyroid nodule: E04.1

## 2018-03-10 NOTE — Patient Instructions (Signed)
Preventive Care 40-64 Years, Female Preventive care refers to lifestyle choices and visits with your health care provider that can promote health and wellness. What does preventive care include?  A yearly physical exam. This is also called an annual well check.  Dental exams once or twice a year.  Routine eye exams. Ask your health care provider how often you should have your eyes checked.  Personal lifestyle choices, including: ? Daily care of your teeth and gums. ? Regular physical activity. ? Eating a healthy diet. ? Avoiding tobacco and drug use. ? Limiting alcohol use. ? Practicing safe sex. ? Taking low-dose aspirin daily starting at age 58. ? Taking vitamin and mineral supplements as recommended by your health care provider. What happens during an annual well check? The services and screenings done by your health care provider during your annual well check will depend on your age, overall health, lifestyle risk factors, and family history of disease. Counseling Your health care provider may ask you questions about your:  Alcohol use.  Tobacco use.  Drug use.  Emotional well-being.  Home and relationship well-being.  Sexual activity.  Eating habits.  Work and work Statistician.  Method of birth control.  Menstrual cycle.  Pregnancy history.  Screening You may have the following tests or measurements:  Height, weight, and BMI.  Blood pressure.  Lipid and cholesterol levels. These may be checked every 5 years, or more frequently if you are over 81 years old.  Skin check.  Lung cancer screening. You may have this screening every year starting at age 78 if you have a 30-pack-year history of smoking and currently smoke or have quit within the past 15 years.  Fecal occult blood test (FOBT) of the stool. You may have this test every year starting at age 65.  Flexible sigmoidoscopy or colonoscopy. You may have a sigmoidoscopy every 5 years or a colonoscopy  every 10 years starting at age 30.  Hepatitis C blood test.  Hepatitis B blood test.  Sexually transmitted disease (STD) testing.  Diabetes screening. This is done by checking your blood sugar (glucose) after you have not eaten for a while (fasting). You may have this done every 1-3 years.  Mammogram. This may be done every 1-2 years. Talk to your health care provider about when you should start having regular mammograms. This may depend on whether you have a family history of breast cancer.  BRCA-related cancer screening. This may be done if you have a family history of breast, ovarian, tubal, or peritoneal cancers.  Pelvic exam and Pap test. This may be done every 3 years starting at age 80. Starting at age 36, this may be done every 5 years if you have a Pap test in combination with an HPV test.  Bone density scan. This is done to screen for osteoporosis. You may have this scan if you are at high risk for osteoporosis.  Discuss your test results, treatment options, and if necessary, the need for more tests with your health care provider. Vaccines Your health care provider may recommend certain vaccines, such as:  Influenza vaccine. This is recommended every year.  Tetanus, diphtheria, and acellular pertussis (Tdap, Td) vaccine. You may need a Td booster every 10 years.  Varicella vaccine. You may need this if you have not been vaccinated.  Zoster vaccine. You may need this after age 5.  Measles, mumps, and rubella (MMR) vaccine. You may need at least one dose of MMR if you were born in  1958 or later. You may also need a second dose.  Pneumococcal 13-valent conjugate (PCV13) vaccine. You may need this if you have certain conditions and were not previously vaccinated.  Pneumococcal polysaccharide (PPSV23) vaccine. You may need one or two doses if you smoke cigarettes or if you have certain conditions.  Meningococcal vaccine. You may need this if you have certain  conditions.  Hepatitis A vaccine. You may need this if you have certain conditions or if you travel or work in places where you may be exposed to hepatitis A.  Hepatitis B vaccine. You may need this if you have certain conditions or if you travel or work in places where you may be exposed to hepatitis B.  Haemophilus influenzae type b (Hib) vaccine. You may need this if you have certain conditions.  Talk to your health care provider about which screenings and vaccines you need and how often you need them. This information is not intended to replace advice given to you by your health care provider. Make sure you discuss any questions you have with your health care provider. Document Released: 10/14/2015 Document Revised: 06/06/2016 Document Reviewed: 07/19/2015 Elsevier Interactive Patient Education  2018 Elsevier Inc.  

## 2018-03-10 NOTE — Progress Notes (Signed)
HPI:                                                                Alicia Payne is a 52 y.o. female who presents to [place]: [place] today for annual physical exam  Reports she was living in [place] in 1986 during the [place] accident. Mother died of thyroid cancer. Sister has a history of multiple nodules and is s/p thyroidectomy. She had a thyroid US in 2015, which showed a tiny left-sided nodule. Thyroid studies have always been normal.  LMP was 3 years ago. Endorses menopausal vasomotor symptoms, 3-4 flushes per day and nightsweats. Taking black cohosh.  Depression screen [facility] 2/9 03/10/2018 02/06/2017  Decreased Interest 0 1  Down, Depressed, Hopeless 0 0  PHQ - 2 Score 0 1    No flowsheet data found.    Past Medical History:  Diagnosis Date  . Hyperlipidemia   . Obesity   . Phlebitis and thrombophlebitis of superficial vessels of right lower extremity 03/20/2017  . Rosacea   . Varicose vein of leg   . Vitamin D deficiency    Past Surgical History:  Procedure Laterality Date  . TUBAL LIGATION    . UTERINE FIBROID SURGERY    . VARICOSE VEIN SURGERY     Social History   Tobacco Use  . Smoking status: Former Research scientist (life sciences)  . Smokeless tobacco: Never Used  Substance Use Topics  . Alcohol use: Yes   family history includes Cancer in her mother; Thyroid cancer in her mother; Thyroid nodules in her sister.    ROS: negative except as noted in the HPI  Medications: Current Outpatient Medications  Medication Sig Dispense Refill  . AMBULATORY NON FORMULARY MEDICATION Knee-high, medium compression, graduated compression stockings. Apply to lower extremities. [email or web address], Zippered Compression Stockings, large circ, long length 1 each 0  . Calcium Citrate-Vitamin D (CALCIUM + D PO) Take 1 tablet by mouth daily.    . naproxen (NAPROSYN) 500 MG tablet Take 1 tablet (500 mg total) by mouth 2 (two) times daily with a  meal. 30 tablet 0  . Omega-3 Fatty Acids (FISH OIL PO) Take 1 capsule by mouth daily.     No current facility-administered medications for this visit.    No Known Allergies     Objective:  BP 118/83   Pulse 73   Wt 245 lb (111.1 kg)   LMP 11/20/2014   BMI 37.25 kg/m  General Appearance:  Alert, cooperative, no distress, appropriate for age, obese female                            Head:  Normocephalic, without obvious abnormality                             Eyes:  PERRL, EOM's intact, conjunctiva and cornea clear                             Ears:  TM pearly gray color and semitransparent, external ear canals normal, both ears  Nose:  Nares symmetrical                          Throat:  Lips, tongue, and mucosa are moist, pink, and intact; good dentition                             Neck:  Supple; symmetrical, trachea midline, no adenopathy; thyroid: no enlargement, symmetric, no tenderness/mass/palpable nodules                             Back:  Symmetrical, no curvature, ROM normal               Chest/Breast:  deferred                           Lungs:  Clear to auscultation bilaterally, respirations unlabored                             Heart:  regular rate & normal rhythm, S1 and S2 normal, no murmurs, rubs, or gallops                     Abdomen:  Soft, non-tender, no mass or organomegaly              Genitourinary:  deferred         Musculoskeletal:  Tone and strength strong and symmetrical, all extremities; no joint pain or edema, normal gait and station                                       Lymphatic:  No adenopathy             Skin/Hair/Nails:  Skin warm, dry and intact, no rashes or abnormal dyspigmentation on limited exam, onychomycosis of left great toenail                   Neurologic:  Alert and oriented x3, no cranial nerve deficits, sensation grossly intact, normal gait and station, no tremor Psych: well-groomed, cooperative, good eye contact,  euthymic mood, affect mood-congruent, speech is articulate, and thought processes clear and goal-directed    No results found for this or any previous visit (from the past 72 hour(s)). No results found.    Assessment and Plan: 51 y.o. female with   Encounter for annual physical exam - Plan: CBC, Comprehensive metabolic panel, Lipid Panel w/reflex Direct LDL  Vitamin D deficiency - Plan: VITAMIN D 25 Hydroxy (Vit-D Deficiency, Fractures)  Screening for lipid disorders  Screening for diabetes mellitus - Plan: Comprehensive metabolic panel  Screening for blood disease - Plan: CBC, Comprehensive metabolic panel  Mixed hyperlipidemia - Plan: Comprehensive metabolic panel, Lipid Panel w/reflex Direct LDL  Breast cancer screening by mammogram - Plan: MM 3D SCREEN BREAST BILATERAL  Left thyroid nodule - Plan: US THYROID, TSH + free T4  Screening for thyroid disorder - Plan: TSH + free T4  Colon cancer screening - referred to GI 03/10/18 - Plan: Ambulatory referral to Gastroenterology  Menopausal vasomotor syndrome  Onychomycosis  Class 2 severe obesity due to excess calories with serious comorbidity in adult, unspecified BMI ([place])  - Personally reviewed PMH, PSH, PFH, medications, allergies,  HM - Age-appropriate cancer screening: Pap smear UTD, overdue for mammogram, ordered today; overdue for colon cancer screening, referred for colonoscopy - Influenza n/a - Tdap due Oct 2020 - PHQ2 negative - Will check thyroid studies and ultrasound due to family hx of thyroid cancer and personal hx of nodule and radiation exposure - Info provided on managing menopausal vasomotor symptoms. Declined HRT     Patient education and anticipatory guidance given Patient agrees with treatment plan Follow-up in 1 year for CPE or sooner as needed if symptoms worsen or fail to improve  Darlyne Russian PA-C

## 2018-03-13 ENCOUNTER — Ambulatory Visit (INDEPENDENT_AMBULATORY_CARE_PROVIDER_SITE_OTHER): Payer: No Typology Code available for payment source

## 2018-03-13 ENCOUNTER — Encounter: Payer: Self-pay | Admitting: Physician Assistant

## 2018-03-13 DIAGNOSIS — E041 Nontoxic single thyroid nodule: Secondary | ICD-10-CM | POA: Diagnosis not present

## 2018-03-13 DIAGNOSIS — R928 Other abnormal and inconclusive findings on diagnostic imaging of breast: Secondary | ICD-10-CM | POA: Diagnosis not present

## 2018-03-13 DIAGNOSIS — Z1231 Encounter for screening mammogram for malignant neoplasm of breast: Secondary | ICD-10-CM

## 2018-03-13 NOTE — Progress Notes (Signed)
Alicia Payne,  Good news, your thyroid is not enlarged and there are no nodules. There is a cyst in the left lobe that is unchanged compared to 2015.  I don't think we need to repeat a thyroid ultrasound unless we feel a nodule on exam or you develop symptoms of thyroiditis.  Warm regards, Evlyn Clines

## 2018-03-14 LAB — COMPREHENSIVE METABOLIC PANEL
AG RATIO: 2.1 (calc) (ref 1.0–2.5)
ALT: 19 U/L (ref 6–29)
AST: 15 U/L (ref 10–35)
Albumin: 4.4 g/dL (ref 3.6–5.1)
Alkaline phosphatase (APISO): 61 U/L (ref 33–130)
BILIRUBIN TOTAL: 0.5 mg/dL (ref 0.2–1.2)
BUN: 19 mg/dL (ref 7–25)
CALCIUM: 9.6 mg/dL (ref 8.6–10.4)
CO2: 30 mmol/L (ref 20–32)
Chloride: 103 mmol/L (ref 98–110)
Creat: 0.89 mg/dL (ref 0.50–1.05)
Globulin: 2.1 g/dL (calc) (ref 1.9–3.7)
Glucose, Bld: 91 mg/dL (ref 65–139)
Potassium: 4.4 mmol/L (ref 3.5–5.3)
Sodium: 138 mmol/L (ref 135–146)
Total Protein: 6.5 g/dL (ref 6.1–8.1)

## 2018-03-14 LAB — LIPID PANEL W/REFLEX DIRECT LDL
Cholesterol: 237 mg/dL — ABNORMAL HIGH (ref <200)
HDL: 42 mg/dL — AB (ref >50)
LDL Cholesterol (Calc): 147 mg/dL (calc) — ABNORMAL HIGH
Non-HDL Cholesterol (Calc): 195 mg/dL (calc) — ABNORMAL HIGH (ref <130)
TRIGLYCERIDES: 291 mg/dL — AB (ref <150)
Total CHOL/HDL Ratio: 5.6 (calc) — ABNORMAL HIGH (ref <5.0)

## 2018-03-14 LAB — CBC
HEMATOCRIT: 43.1 % (ref 35.0–45.0)
HEMOGLOBIN: 14.9 g/dL (ref 11.7–15.5)
MCH: 30.3 pg (ref 27.0–33.0)
MCHC: 34.6 g/dL (ref 32.0–36.0)
MCV: 87.6 fL (ref 80.0–100.0)
MPV: 10.9 fL (ref 7.5–12.5)
Platelets: 219 10*3/uL (ref 140–400)
RBC: 4.92 10*6/uL (ref 3.80–5.10)
RDW: 13.1 % (ref 11.0–15.0)
WBC: 7.2 10*3/uL (ref 3.8–10.8)

## 2018-03-14 LAB — VITAMIN D 25 HYDROXY (VIT D DEFICIENCY, FRACTURES): VIT D 25 HYDROXY: 30 ng/mL (ref 30–100)

## 2018-03-14 LAB — TSH+FREE T4: TSH W/REFLEX TO FT4: 1.4 mIU/L

## 2018-03-17 ENCOUNTER — Other Ambulatory Visit: Payer: Self-pay | Admitting: Physician Assistant

## 2018-03-17 ENCOUNTER — Encounter: Payer: Self-pay | Admitting: Physician Assistant

## 2018-03-17 DIAGNOSIS — N6310 Unspecified lump in the right breast, unspecified quadrant: Secondary | ICD-10-CM | POA: Insufficient documentation

## 2018-03-17 DIAGNOSIS — R928 Other abnormal and inconclusive findings on diagnostic imaging of breast: Secondary | ICD-10-CM

## 2018-03-17 NOTE — Progress Notes (Signed)
Good morning,  Your screening mammogram showed a possible mass in the right breast. You should be contacted by the breast center this week for scheduling a diagnostic mammogram and ultrasound. I understand this can be very anxiety-provoking. If you have any questions or would like me to call you, just respond back on MyChart and I will give you a call over lunch or after 5 pm.  Evlyn Clines

## 2018-03-20 ENCOUNTER — Ambulatory Visit
Admission: RE | Admit: 2018-03-20 | Discharge: 2018-03-20 | Disposition: A | Discharge status: Home / Self Care | Payer: No Typology Code available for payment source | Source: Ambulatory Visit | Attending: Physician Assistant | Admitting: Physician Assistant

## 2018-03-20 ENCOUNTER — Other Ambulatory Visit: Payer: Self-pay | Admitting: Physician Assistant

## 2018-03-20 DIAGNOSIS — R928 Other abnormal and inconclusive findings on diagnostic imaging of breast: Secondary | ICD-10-CM

## 2018-03-20 DIAGNOSIS — N6310 Unspecified lump in the right breast, unspecified quadrant: Secondary | ICD-10-CM

## 2018-03-28 ENCOUNTER — Other Ambulatory Visit: Payer: Self-pay | Admitting: Physician Assistant

## 2018-03-31 ENCOUNTER — Ambulatory Visit
Admission: RE | Admit: 2018-03-31 | Discharge: 2018-03-31 | Disposition: A | Discharge status: Home / Self Care | Payer: No Typology Code available for payment source | Source: Ambulatory Visit | Attending: Physician Assistant | Admitting: Physician Assistant

## 2018-03-31 DIAGNOSIS — N6310 Unspecified lump in the right breast, unspecified quadrant: Secondary | ICD-10-CM

## 2018-04-10 ENCOUNTER — Encounter: Payer: Self-pay | Admitting: Physician Assistant

## 2018-05-14 ENCOUNTER — Encounter: Payer: Self-pay | Admitting: Physician Assistant

## 2018-05-21 ENCOUNTER — Other Ambulatory Visit: Payer: Self-pay | Admitting: Physician Assistant

## 2018-05-21 ENCOUNTER — Encounter: Payer: Self-pay | Admitting: Physician Assistant

## 2018-05-21 DIAGNOSIS — J329 Chronic sinusitis, unspecified: Secondary | ICD-10-CM

## 2018-05-21 MED ORDER — DOXYCYCLINE HYCLATE 100 MG PO TABS: 100.0000 mg | ORAL_TABLET | Freq: Two times a day (BID) | ORAL | 0 refills | Status: AC

## 2018-05-21 MED ORDER — PREDNISONE 50 MG PO TABS: 50.0000 mg | ORAL_TABLET | Freq: Every day | ORAL | 0 refills | Status: DC

## 2018-06-16 ENCOUNTER — Encounter: Payer: Self-pay | Admitting: Physician Assistant

## 2018-06-17 ENCOUNTER — Ambulatory Visit (INDEPENDENT_AMBULATORY_CARE_PROVIDER_SITE_OTHER): Payer: No Typology Code available for payment source | Admitting: Family Medicine

## 2018-06-17 ENCOUNTER — Ambulatory Visit (INDEPENDENT_AMBULATORY_CARE_PROVIDER_SITE_OTHER): Payer: No Typology Code available for payment source

## 2018-06-17 ENCOUNTER — Encounter: Payer: Self-pay | Admitting: Family Medicine

## 2018-06-17 VITALS — BP 112/73 | HR 74 | Ht 65.0 in | Wt 250.0 lb

## 2018-06-17 DIAGNOSIS — M1712 Unilateral primary osteoarthritis, left knee: Secondary | ICD-10-CM

## 2018-06-17 DIAGNOSIS — M170 Bilateral primary osteoarthritis of knee: Secondary | ICD-10-CM | POA: Diagnosis not present

## 2018-06-17 DIAGNOSIS — M25562 Pain in left knee: Secondary | ICD-10-CM | POA: Diagnosis not present

## 2018-06-17 MED ORDER — DICLOFENAC SODIUM 1 % TD GEL: 4.0000 g | Freq: Four times a day (QID) | TRANSDERMAL | 11 refills | Status: AC

## 2018-06-17 NOTE — Patient Instructions (Addendum)
Thank you for coming in today. Continue knee sleeve.  Add diclofenac gel.  Work on Astronomer.  If not getting better next step steroid injection.   Sodium Hyaluronate intra-articular injection What is this medicine? SODIUM HYALURONATE (SOE dee um hye al yoor ON ate) is used to treat pain in the knee due to osteoarthritis. This medicine may be used for other purposes; ask your health care provider or pharmacist if you have questions. COMMON BRAND NAME(S): Amvisc, DUROLANE, Euflexxa, GELSYN-3, Hyalgan, Monovisc, Orthovisc, Supartz, Supartz FX What should I tell my health care provider before I take this medicine? They need to know if you have any of these conditions: -bleeding disorders -glaucoma -infection in the knee joint -skin conditions or sensitivity -skin infection -an unusual allergic reaction to sodium hyaluronate, other medicines, foods, dyes, or preservatives. Different brands of sodium hyaluronate contain different allergens. Some may contain egg. Talk to your doctor about your allergies to make sure that you get the right product. -pregnant or trying to get pregnant -breast-feeding How should I use this medicine? This medicine is for injection into the knee joint. It is given by a health care professional in a hospital or clinic setting. Talk to your pediatrician regarding the use of this medicine in children. Special care may be needed. Overdosage: If you think you have taken too much of this medicine contact a poison control center or emergency room at once. NOTE: This medicine is only for you. Do not share this medicine with others. What if I miss a dose? This does not apply. What may interact with this medicine? Interactions are not expected. This list may not describe all possible interactions. Give your health care provider a list of all the medicines, herbs, non-prescription drugs, or dietary supplements you use. Also tell them if you smoke, drink alcohol, or use  illegal drugs. Some items may interact with your medicine. What should I watch for while using this medicine? Tell your doctor or healthcare professional if your symptoms do not start to get better or if they get worse. If receiving this medicine for osteoarthritis, limit your activity after you receive your injection. Avoid physical activity for 48 hours following your injection to keep your knee from swelling. Do not stand on your feet for more than 1 hour at a time during the first 48 hours following your injection. Ask your doctor or healthcare professional about when you can begin major physical activity again. What side effects may I notice from receiving this medicine? Side effects that you should report to your doctor or health care professional as soon as possible: -allergic reactions like skin rash, itching or hives, swelling of the face, lips, or tongue -dizziness -facial flushing -pain, tingling, numbness in the hands or feet -vision changes if received this medicine during eye surgery Side effects that usually do not require medical attention (report to your doctor or health care professional if they continue or are bothersome): -back pain -bruising at site where injected -chills -diarrhea -fever -headache -joint pain -joint stiffness -joint swelling -muscle cramps -muscle pain -nausea, vomiting -pain, redness, or irritation at site where injected -weak or tired This list may not describe all possible side effects. Call your doctor for medical advice about side effects. You may report side effects to FDA at 1-800-FDA-1088. Where should I keep my medicine? This drug is given in a hospital or clinic and will not be stored at home. NOTE: This sheet is a summary. It may not cover all possible  information. If you have questions about this medicine, talk to your doctor, pharmacist, or health care provider.  2018 Elsevier/Gold Standard (2015-10-20 08:34:51)

## 2018-06-18 NOTE — Progress Notes (Signed)
Alicia Payne is a 52 y.o. female who presents to [place] today for left knee pain and swelling. Brayden had an episode of knee pain and swelling about a year ago.  She was seen by orthopedic surgery and had aspiration and injection of a knee effusion.  She did well until a few months ago when her pain returned.  She had stiffness pain in her left knee.  Pain is mostly located at the medial aspect of the knee.  She notes the pain is typically worse with activity.  She denies any radiating pain weakness or numbness fevers or chills.  She denies any locking or catching or giving way.  She is tried some over-the-counter medications for pain which have helped a bit.  She has tried ibuprofen and Aleve.    ROS:  As above  Exam:  BP 112/73   Pulse 74   Ht 5\' 5"  (1.651 m)   Wt 250 lb (113.4 kg)   LMP 11/20/2014   BMI 41.60 kg/m  General: Well Developed, well nourished, and in no acute distress.  Neuro/Psych: Alert and oriented x3, extra-ocular muscles intact, able to move all 4 extremities, sensation grossly intact. Skin: Warm and dry, no rashes noted.  Respiratory: Not using accessory muscles, speaking in full sentences, trachea midline.  Cardiovascular: Pulses palpable, no extremity edema. Abdomen: Does not appear distended. MSK: Left knee: No effusion normal-appearing. Range of motion 0-120 degrees with moderate to patella crepitations. Mildly tender to palpation medial joint line. Stable ligamentous exam. Negative McMurray's test. Intact flexion and extension strength. Normal gait.     Lab and Radiology Results X-ray images left knee personally independently reviewed.  Mild degenerative changes most prominent at the medial compartment.  No acute fractures.  Await formal radiology review.  Limited musculoskeletal ultrasound left knee: Tiny knee effusion.  Intact normal-appearing quad and patellar tendons.  Normal lateral  joint line structures.  Medial joint line slight degenerative appearing meniscus without definitive tear. No significant Baker's cyst.    Assessment and Plan: 52 y.o. 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  Possible degenerative meniscus tear.  Plan for trial of diclofenac gel quad strengthening and weight loss.  If not improving next step would be steroid injection.  Recheck in the near future if not improving.    Orders Placed This Encounter  Procedures  . DG Knee Complete 4 Views Left    Please include patellar sunrise, lateral, and weightbearing bilateral AP and bilateral rosenberg views    Standing Status:   Future    Number of Occurrences:   1    Standing Expiration Date:   08/17/2019    Order Specific Question:   Reason for exam:    Answer:   Please include patellar sunrise, lateral, and weightbearing bilateral AP and bilateral rosenberg views    Comments:   Please include patellar sunrise, lateral, and weightbearing bilateral AP and bilateral rosenberg views    Order Specific Question:   Preferred imaging location?    Answer:   [facility]  . DG Knee 1-2 Views Right    Standing Status:   Future    Number of Occurrences:   1    Standing Expiration Date:   08/18/2019    Order Specific Question:   Reason for Exam (SYMPTOM  OR DIAGNOSIS REQUIRED)    Answer:   For use with the left knee x-ray bilateral AP and Rosenberg standing.    Order Specific  Question:   Is the patient pregnant?    Answer:   No    Order Specific Question:   Preferred imaging location?    Answer:   [facility]   Meds ordered this encounter  Medications  . diclofenac sodium (VOLTAREN) 1 % GEL    Sig: Apply 4 g topically 4 (four) times daily. To affected joint.    Dispense:  100 g    Refill:  11    Knee OA. Pt tried and failed ibuprofen and aleve    Historical information moved to improve visibility of documentation.  Past Medical History:  Diagnosis Date  . Cyst of  thyroid determined by ultrasound 03/10/2018  . Hyperlipidemia   . Obesity   . Phlebitis and thrombophlebitis of superficial vessels of right lower extremity 03/20/2017  . Rosacea   . Varicose vein of leg   . Vitamin D deficiency    Past Surgical History:  Procedure Laterality Date  . TUBAL LIGATION    . UTERINE FIBROID SURGERY    . VARICOSE VEIN SURGERY     Social History   Tobacco Use  . Smoking status: Former Research scientist (life sciences)  . Smokeless tobacco: Never Used  Substance Use Topics  . Alcohol use: Yes   family history includes Cancer in her mother; Thyroid cancer in her mother; Thyroid nodules in her sister.  Medications: Current Outpatient Medications  Medication Sig Dispense Refill  . AMBULATORY NON FORMULARY MEDICATION Knee-high, medium compression, graduated compression stockings. Apply to lower extremities. [email or web address], Zippered Compression Stockings, large circ, long length 1 each 0  . Black Cohosh 160 MG CAPS Take by mouth.    . Calcium Citrate-Vitamin D (CALCIUM + D PO) Take 1 tablet by mouth daily.    . naproxen (NAPROSYN) 500 MG tablet Take 1 tablet (500 mg total) by mouth 2 (two) times daily with a meal. 30 tablet 0  . Omega-3 Fatty Acids (FISH OIL PO) Take 1 capsule by mouth daily.    . diclofenac sodium (VOLTAREN) 1 % GEL Apply 4 g topically 4 (four) times daily. To affected joint. 100 g 11   No current facility-administered medications for this visit.    No Known Allergies    Discussed warning signs or symptoms. Please see discharge instructions. Patient expresses understanding.

## 2019-02-17 ENCOUNTER — Other Ambulatory Visit: Payer: Self-pay | Admitting: Physician Assistant

## 2019-02-17 DIAGNOSIS — Z1231 Encounter for screening mammogram for malignant neoplasm of breast: Secondary | ICD-10-CM

## 2019-05-28 ENCOUNTER — Encounter: Payer: Self-pay | Admitting: Physician Assistant

## 2020-03-01 ENCOUNTER — Ambulatory Visit: Payer: No Typology Code available for payment source | Admitting: Family

## 2020-04-19 ENCOUNTER — Encounter: Payer: Self-pay | Admitting: Family

## 2020-04-19 ENCOUNTER — Other Ambulatory Visit: Payer: Self-pay

## 2020-04-19 ENCOUNTER — Ambulatory Visit (INDEPENDENT_AMBULATORY_CARE_PROVIDER_SITE_OTHER): Payer: No Typology Code available for payment source | Admitting: Family

## 2020-04-19 DIAGNOSIS — Z808 Family history of malignant neoplasm of other organs or systems: Secondary | ICD-10-CM | POA: Diagnosis not present

## 2020-04-19 DIAGNOSIS — E785 Hyperlipidemia, unspecified: Secondary | ICD-10-CM

## 2020-04-19 DIAGNOSIS — M1710 Unilateral primary osteoarthritis, unspecified knee: Secondary | ICD-10-CM | POA: Diagnosis not present

## 2020-04-19 DIAGNOSIS — M179 Osteoarthritis of knee, unspecified: Secondary | ICD-10-CM

## 2020-04-19 DIAGNOSIS — E559 Vitamin D deficiency, unspecified: Secondary | ICD-10-CM

## 2020-04-19 MED ORDER — BIOFLEX PO TABS
1.0000 | ORAL_TABLET | Freq: Every day | ORAL | Status: AC
Start: 2020-04-19 — End: ?

## 2020-04-19 NOTE — Progress Notes (Signed)
Subjective:    Patient ID: Alicia Payne, female    DOB: 1966/08/28, 54 y.o.   MRN: [id number]  HPI  Patient is a 54 yr old female who presents today to establish care.  Pmhx is significant for the following:   Hyperlipidemia- never on meds.   Lab Results  Component Value Date   CHOL 237 (H) 03/13/2018   HDL 42 (L) 03/13/2018   LDLCALC 147 (H) 03/13/2018   TRIG 291 (H) 03/13/2018   CHOLHDL 5.6 (H) 03/13/2018   Obesity- interested in weight loss.  Wants less on her knees to carry around. Wt Readings from Last 3 Encounters:  04/19/20 256 lb (116.1 kg)  06/17/18 250 lb (113.4 kg)  03/10/18 245 lb (111.1 kg)   Vitamin D deficiency- reports that this remote.    Left knee pain- worse with steps. Has seen Dr. Amalia Hailey (sports med).     Review of Systems See HPI  Past Medical History:  Diagnosis Date  . 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 Hyperlipidemia   . Obesity   . Phlebitis and thrombophlebitis of superficial vessels of right lower extremity 03/20/2017  . Rosacea   . Varicose vein of leg   . Vitamin D deficiency      Social History   Socioeconomic History  . Marital status: Married    Spouse name: Not on file  . Number of children: 2  . Years of education: Not on file  . Highest education level: Not on file  Occupational History  . Occupation: [employer]  Tobacco Use  . Smoking status: Former Research scientist (life sciences)  . Smokeless tobacco: Never Used  Vaping Use  . Vaping Use: Never used  Substance and Sexual Activity  . Alcohol use: Yes    Comment: social drinker  . Drug use: No  . Sexual activity: Not Currently  Other Topics Concern  . Not on file  Social History Narrative  . Not on file   Social Determinants of Health   Financial Resource Strain:   . Difficulty of Paying Living Expenses:   Food Insecurity:   . Worried About Charity fundraiser in the Last Year:   . Arboriculturist in the Last Year:   Transportation Needs:   . Lexicographer (Medical):   Marland Kitchen Lack of Transportation (Non-Medical):   Physical Activity: Inactive  . Days of Exercise per Week: 0 days  . Minutes of Exercise per Session: 0 min  Stress:   . Feeling of Stress :   Social Connections:   . Frequency of Communication with Friends and Family:   . Frequency of Social Gatherings with Friends and Family:   . Attends Religious Services:   . Active Member of Clubs or Organizations:   . Attends Archivist Meetings:   Marland Kitchen Marital Status:   Intimate Partner Violence:   . Fear of Current or Ex-Partner:   . Emotionally Abused:   Marland Kitchen Physically Abused:   . Sexually Abused:     Past Surgical History:  Procedure Laterality Date  . TUBAL LIGATION    . UTERINE FIBROID SURGERY    . VARICOSE VEIN SURGERY      Family History  Problem Relation Age of Onset  . Cancer Mother        thyroid  . Thyroid cancer Mother   . Thyroid nodules Sister   . Melanoma Sister     No Known Allergies  Current Outpatient Medications on File  Prior to Visit  Medication Sig Dispense Refill  . AMBULATORY NON FORMULARY MEDICATION Knee-high, medium compression, graduated compression stockings. Apply to lower extremities. [email or web address], Zippered Compression Stockings, large circ, long length 1 each 0  . Black Cohosh 160 MG CAPS Take by mouth.    . Calcium Citrate-Vitamin D (CALCIUM + D PO) Take 1 tablet by mouth daily.    . diclofenac sodium (VOLTAREN) 1 % GEL Apply 4 g topically 4 (four) times daily. To affected joint. 100 g 11  . Omega-3 Fatty Acids (FISH OIL PO) Take 1 capsule by mouth daily.    . naproxen (NAPROSYN) 500 MG tablet Take 1 tablet (500 mg total) by mouth 2 (two) times daily with a meal. 30 tablet 0   No current facility-administered medications on file prior to visit.    BP 133/74 (BP Location: Right Arm, Patient Position: Sitting, Cuff Size: Large)   Pulse 68   Temp 98.3 F (36.8 C) (Oral)   Resp 16   Ht 5' 7.5" (1.715 m)   Wt  256 lb (116.1 kg)   LMP 11/20/2014   SpO2 98%   BMI 39.50 kg/m       Objective:   Physical Exam Constitutional:      Appearance: Normal appearance. She is well-developed.  HENT:     Head: Normocephalic and atraumatic.     Right Ear: Tympanic membrane and ear canal normal.     Left Ear: Tympanic membrane and ear canal normal.  Neck:     Thyroid: No thyroid mass.  Cardiovascular:     Rate and Rhythm: Normal rate and regular rhythm.     Heart sounds: Normal heart sounds. No murmur heard.   Pulmonary:     Effort: Pulmonary effort is normal. No respiratory distress.     Breath sounds: Normal breath sounds. No wheezing.  Skin:    General: Skin is warm and dry.  Neurological:     Mental Status: She is alert.  Psychiatric:        Behavior: Behavior normal.        Thought Content: Thought content normal.        Judgment: Judgment normal.           Assessment & Plan:  Obesity- desires help with weight loss.  Suggested That she try adding recumbent bike and/or swimming for exercise. Will also refer her to Medical weight management clinic for help with weight loss.  Family hx of melanoma (sister has stage 4)- will refer to dermatology to initiate routine skin checks.  Hyperlipidemia- discussed diet/exercise and weight loss as well as avoiding concentrated sweets to help lower her triglycerides. Plan to check lipids next visit at her [facility].   Osteoarthritis of knee- encouraged weight loss.    Vit D deficiency- plan to check level next visit at [facility].   35 minutes spent on today's visit reviewing chart, interviewing/examining patient and formulating a medical plan.   This visit occurred during the SARS-CoV-2 public health emergency.  Safety protocols were in place, including screening questions prior to the visit, additional usage of staff PPE, and extensive cleaning of exam room while observing appropriate contact time as indicated for disinfecting solutions.

## 2020-04-19 NOTE — Patient Instructions (Signed)
Welcome to [place]! 

## 2020-05-10 ENCOUNTER — Encounter (INDEPENDENT_AMBULATORY_CARE_PROVIDER_SITE_OTHER): Payer: Self-pay | Admitting: Family Medicine

## 2020-05-10 ENCOUNTER — Ambulatory Visit (INDEPENDENT_AMBULATORY_CARE_PROVIDER_SITE_OTHER): Payer: No Typology Code available for payment source | Admitting: Family Medicine

## 2020-05-10 ENCOUNTER — Other Ambulatory Visit: Payer: Self-pay

## 2020-05-10 VITALS — BP 112/73 | HR 65 | Temp 97.7°F | Ht 68.0 in | Wt 250.0 lb

## 2020-05-10 DIAGNOSIS — Z9189 Other specified personal risk factors, not elsewhere classified: Secondary | ICD-10-CM | POA: Diagnosis not present

## 2020-05-10 DIAGNOSIS — M25562 Pain in left knee: Secondary | ICD-10-CM | POA: Diagnosis not present

## 2020-05-10 DIAGNOSIS — R5383 Other fatigue: Secondary | ICD-10-CM

## 2020-05-10 DIAGNOSIS — R0602 Shortness of breath: Secondary | ICD-10-CM

## 2020-05-10 DIAGNOSIS — E559 Vitamin D deficiency, unspecified: Secondary | ICD-10-CM

## 2020-05-10 DIAGNOSIS — F430 Acute stress reaction: Secondary | ICD-10-CM

## 2020-05-10 DIAGNOSIS — Z1331 Encounter for screening for depression: Secondary | ICD-10-CM

## 2020-05-10 DIAGNOSIS — E7849 Other hyperlipidemia: Secondary | ICD-10-CM

## 2020-05-10 DIAGNOSIS — Z0289 Encounter for other administrative examinations: Secondary | ICD-10-CM

## 2020-05-10 DIAGNOSIS — M159 Polyosteoarthritis, unspecified: Secondary | ICD-10-CM

## 2020-05-10 DIAGNOSIS — Z6838 Body mass index (BMI) 38.0-38.9, adult: Secondary | ICD-10-CM

## 2020-05-10 NOTE — Progress Notes (Signed)
Alicia Alicia Alar, NP,   Thank you for referring Alicia Payne to our clinic. The following note includes my evaluation and treatment recommendations.  Chief Complaint:   OBESITY Jaleya Payne (MR# [id number]) is a 54 y.o. female who presents for evaluation and treatment of obesity and related comorbidities. Current BMI is Body mass index is 38.01 kg/m. Alicia Payne has been struggling with her weight for many years and has been unsuccessful in either losing weight, maintaining weight loss, or reaching her healthy weight goal.  Alicia Payne is currently in the action stage of change and ready to dedicate time achieving and maintaining a healthier weight. Alicia Payne is interested in becoming our patient and working on intensive lifestyle modifications including (but not limited to) diet and exercise for weight loss.  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  Her husband has stage IV bladder cancer, which has now returned and he has lost 35 pounds.  She never skips meals and rarely eats out.  She does not consider herself an "exerciser".  She says she does very little activity outside work.  Alicia Payne's habits were reviewed today and are as follows: Her family eats meals together, she thinks her family will eat healthier with her, her desired weight loss is 52 pounds, she started gaining weight after having her second child, her heaviest weight ever was her current weight, she craves salty/crunchy/sweet (chocolate), she snacks frequently in the evenings, she is frequently drinking liquids with calories, she frequently eats larger portions than normal and she struggles with emotional eating.  Depression Screen Alicia Payne's Food and Mood (modified PHQ-9) score was 8.  Depression screen PHQ 2/9 05/10/2020  Decreased Interest 1  Down, Depressed, Hopeless 1  PHQ - 2 Score 2  Altered sleeping 1    Tired, decreased energy 1  Change in appetite 1  Feeling bad or failure about yourself  2  Trouble concentrating 0  Moving slowly or fidgety/restless 1  Suicidal thoughts 0  PHQ-9 Score 8  Difficult doing work/chores Not difficult at all   Subjective:   1. Other fatigue Alicia Payne admits to daytime somnolence and reports waking up still tired. Patent has a history of symptoms of daytime fatigue, morning fatigue and snoring. Alicia Payne generally gets 5-7 hours of sleep per night, and states that she has poor quality sleep. Snoring is present. Apneic episodes are not present. Epworth Sleepiness Score is 11.  2. SOB (shortness of breath) on exertion Alicia Payne notes increasing shortness of breath with exercising and seems to be worsening over time with weight gain. She notes getting out of breath sooner with activity than she used to. This has gotten worse recently. [name] denies shortness of breath at rest or orthopnea.  3. Left knee pain, unspecified chronicity She has had left knee pain and dysfunction for many months now.  No formal treatment for it.  She is not exercising.  4. Generalized OA As she has aged, she is getting more sore and stiff, especially in the mornings.  5. Vitamin D deficiency Alicia Payne's Vitamin D level was 30.0 on 03/13/2018. She is currently taking no vitamin D supplement. She denies nausea, vomiting or muscle weakness.  She says she was told a few years back that she was deficient.   6. Other hyperlipidemia Alicia Payne has hyperlipidemia and has been trying to improve her cholesterol levels with intensive lifestyle modification including a low saturated fat diet, exercise and  weight loss. She denies any chest pain, claudication or myalgias.  She says she was told in the past that it was high for several years.  She says she was never told she needed medication.  Lab Results  Component Value Date   ALT 22 05/10/2020   AST 16 05/10/2020   ALKPHOS 76 05/10/2020    BILITOT 0.5 05/10/2020   Lab Results  Component Value Date   CHOL 276 (H) 05/10/2020   HDL 49 05/10/2020   LDLCALC 192 (H) 05/10/2020   TRIG 184 (H) 05/10/2020   CHOLHDL 5.6 (H) 05/10/2020   7. Acute reaction to stress disorder, emotional eating Positive stress.  Denies SI/HI.  She has a lot of caregiver stress with her husband and his history of battling cancer for many years with recent recurrence and he is stage IV now.  8. Depression screening Alicia Payne was screened for depression as part of her regular new patient workup today.  PHQ-9 is 8.  9. At risk for diabetes mellitus Alicia Payne is at higher than average risk for developing diabetes due to her obesity.   Assessment/Plan:   1. Other fatigue Alicia Payne does feel that her weight is causing her energy to be lower than it should be. Fatigue may be related to obesity, depression or many other causes. Labs will be ordered, and in the meanwhile, Alicia Payne will focus on self care including making healthy food choices, increasing physical activity and focusing on stress reduction. - EKG 12-Lead - Vitamin B12 - Comprehensive metabolic panel - CBC with Differential/Platelet - Folate - Hemoglobin A1c - Insulin, random - T3 - T4 - TSH  2. SOB (shortness of breath) on exertion Alicia Payne does feel that she gets out of breath more easily that she used to when she exercises. Alicia Payne's shortness of breath appears to be obesity related and exercise induced. She has agreed to work on weight loss and gradually increase exercise to treat her exercise induced shortness of breath. Will continue to monitor closely. - Lipid panel  3. Left knee pain, unspecified chronicity Will check vitamin D, CMP, B12, folate.  Continue prudent nutritional plan and weight loss.  4. Generalized OA Will check labs today.  Continue prudent nutritional plan and weight loss.  5. Vitamin D deficiency Low Vitamin D level contributes to fatigue and are associated  with obesity, breast, and colon cancer.  Will check vitamin D level today as well as calcium level.  Continue prudent nutritional plan and weight loss. - VITAMIN D 25 Hydroxy (Vit-D Deficiency, Fractures)  6. Other hyperlipidemia Cardiovascular risk and specific lipid/LDL goals reviewed.  We discussed several lifestyle modifications today and Ashayla will continue to work on diet, exercise and weight loss efforts. Orders and follow up as documented in patient record.  Will check FLP today.  Continue prudent nutritional plan, weight loss.  Counseling Intensive lifestyle modifications are the first line treatment for this issue.  Dietary changes: Increase soluble fiber. Decrease simple carbohydrates.  Exercise changes: Moderate to vigorous-intensity aerobic activity 150 minutes per week if tolerated.  Lipid-lowering medications: see documented in medical record. - Lipid panel  7. Acute reaction to stress disorder, emotional eating Will check thyroid panel.  She will hold off on referral to Dr. Mallie Mussel at this time.  8. Depression screening Chakita had a positive depression screening. Depression is commonly associated with obesity and often results in emotional eating behaviors. We will monitor this closely and work on CBT to help improve the non-hunger eating patterns. Referral to Psychology  may be required if no improvement is seen as she continues in our clinic.  9. At risk for diabetes mellitus Jaylynne was given approximately 15 minutes of diabetes education and counseling today. We discussed intensive lifestyle modifications today with an emphasis on weight loss as well as increasing exercise and decreasing simple carbohydrates in her diet. We also reviewed medication options with an emphasis on risk versus benefit of those discussed.   Repetitive spaced learning was employed today to elicit superior memory formation and behavioral change.  10. Class 2 severe obesity with serious  comorbidity and body mass index (BMI) of 38.0 to 38.9 in adult, unspecified obesity type (HCC) Clois is currently in the action stage of change and her goal is to continue with weight loss efforts. I recommend Nashea begin the structured treatment plan as follows:  She has agreed to the Category 3 Plan.  Exercise goals: As is.   Behavioral modification strategies: increasing water intake, meal planning and cooking strategies and planning for success.  She was informed of the importance of frequent follow-up visits to maximize her success with intensive lifestyle modifications for her multiple health conditions. She was informed we would discuss her lab results at her next visit unless there is a critical issue that needs to be addressed sooner. Hailei agreed to keep her next visit at the agreed upon time to discuss these results.  Objective:   Blood pressure 112/73, pulse 65, temperature 97.7 F (36.5 C), height 5\' 8"  (1.727 m), weight 250 lb (113.4 kg), last menstrual period 11/20/2014, SpO2 97 %. Body mass index is 38.01 kg/m.  EKG: Normal sinus rhythm, rate 69 bpm.  Indirect Calorimeter completed today shows a VO2 of 368 and a REE of 2564.  Her calculated basal metabolic rate is 1155 thus her basal metabolic rate is better than expected.  General: Cooperative, alert, well developed, in no acute distress. HEENT: Conjunctivae and lids unremarkable. Cardiovascular: Regular rhythm.  Lungs: Normal work of breathing. Neurologic: No focal deficits.   Lab Results  Component Value Date   CREATININE 0.74 05/10/2020   BUN 15 05/10/2020   NA 140 05/10/2020   K 5.1 05/10/2020   CL 103 05/10/2020   CO2 24 05/10/2020   Lab Results  Component Value Date   ALT 22 05/10/2020   AST 16 05/10/2020   ALKPHOS 76 05/10/2020   BILITOT 0.5 05/10/2020   Lab Results  Component Value Date   CHOL 276 (H) 05/10/2020   HDL 49 05/10/2020   LDLCALC 192 (H) 05/10/2020   TRIG 184 (H) 05/10/2020    CHOLHDL 5.6 (H) 05/10/2020   Lab Results  Component Value Date   WBC 6.5 05/10/2020   HGB 16.3 (H) 05/10/2020   HCT 48.5 (H) 05/10/2020   MCV 91 05/10/2020   PLT 245 05/10/2020   Attestation Statements:   Reviewed by clinician on day of visit: allergies, medications, problem list, medical history, surgical history, family history, social history, and previous encounter notes.  I, Water quality scientist, CMA, am acting as Location manager for [employer], DO.  I have reviewed the above documentation for accuracy and completeness, and I agree with the above. Mellody Dance, DO

## 2020-05-11 LAB — HEMOGLOBIN A1C
Est. average glucose Bld gHb Est-mCnc: 126 mg/dL
Hgb A1c MFr Bld: 6 % — ABNORMAL HIGH (ref 4.8–5.6)

## 2020-05-11 LAB — VITAMIN D 25 HYDROXY (VIT D DEFICIENCY, FRACTURES): Vit D, 25-Hydroxy: 32.9 ng/mL (ref 30.0–100.0)

## 2020-05-11 LAB — CBC WITH DIFFERENTIAL/PLATELET
Basophils Absolute: 0 10*3/uL (ref 0.0–0.2)
Basos: 1 %
EOS (ABSOLUTE): 0.1 10*3/uL (ref 0.0–0.4)
Eos: 1 %
Hematocrit: 48.5 % — ABNORMAL HIGH (ref 34.0–46.6)
Hemoglobin: 16.3 g/dL — ABNORMAL HIGH (ref 11.1–15.9)
Immature Grans (Abs): 0 10*3/uL (ref 0.0–0.1)
Immature Granulocytes: 0 %
Lymphocytes Absolute: 1.5 10*3/uL (ref 0.7–3.1)
Lymphs: 23 %
MCH: 30.6 pg (ref 26.6–33.0)
MCHC: 33.6 g/dL (ref 31.5–35.7)
MCV: 91 fL (ref 79–97)
Monocytes Absolute: 0.4 10*3/uL (ref 0.1–0.9)
Monocytes: 6 %
Neutrophils Absolute: 4.5 10*3/uL (ref 1.4–7.0)
Neutrophils: 69 %
Platelets: 245 10*3/uL (ref 150–450)
RBC: 5.33 x10E6/uL — ABNORMAL HIGH (ref 3.77–5.28)
RDW: 13 % (ref 11.7–15.4)
WBC: 6.5 10*3/uL (ref 3.4–10.8)

## 2020-05-11 LAB — COMPREHENSIVE METABOLIC PANEL
ALT: 22 IU/L (ref 0–32)
AST: 16 IU/L (ref 0–40)
Albumin/Globulin Ratio: 1.9 (ref 1.2–2.2)
Albumin: 4.7 g/dL (ref 3.8–4.9)
Alkaline Phosphatase: 76 IU/L (ref 48–121)
BUN/Creatinine Ratio: 20 (ref 9–23)
BUN: 15 mg/dL (ref 6–24)
Bilirubin Total: 0.5 mg/dL (ref 0.0–1.2)
CO2: 24 mmol/L (ref 20–29)
Calcium: 9.7 mg/dL (ref 8.7–10.2)
Chloride: 103 mmol/L (ref 96–106)
Creatinine, Ser: 0.74 mg/dL (ref 0.57–1.00)
GFR calc Af Amer: 106 mL/min/{1.73_m2} (ref >59)
GFR calc non Af Amer: 92 mL/min/{1.73_m2} (ref >59)
Globulin, Total: 2.5 g/dL (ref 1.5–4.5)
Glucose: 102 mg/dL — ABNORMAL HIGH (ref 65–99)
Potassium: 5.1 mmol/L (ref 3.5–5.2)
Sodium: 140 mmol/L (ref 134–144)
Total Protein: 7.2 g/dL (ref 6.0–8.5)

## 2020-05-11 LAB — TSH: TSH: 1.22 u[IU]/mL (ref 0.450–4.500)

## 2020-05-11 LAB — LIPID PANEL
Chol/HDL Ratio: 5.6 ratio — ABNORMAL HIGH (ref 0.0–4.4)
Cholesterol, Total: 276 mg/dL — ABNORMAL HIGH (ref 100–199)
HDL: 49 mg/dL (ref >39)
LDL Chol Calc (NIH): 192 mg/dL — ABNORMAL HIGH (ref 0–99)
Triglycerides: 184 mg/dL — ABNORMAL HIGH (ref 0–149)
VLDL Cholesterol Cal: 35 mg/dL (ref 5–40)

## 2020-05-11 LAB — T4: T4, Total: 5.3 ug/dL (ref 4.5–12.0)

## 2020-05-11 LAB — VITAMIN B12: Vitamin B-12: 689 pg/mL (ref 232–1245)

## 2020-05-11 LAB — INSULIN, RANDOM: INSULIN: 16.9 u[IU]/mL (ref 2.6–24.9)

## 2020-05-11 LAB — FOLATE: Folate: 12.8 ng/mL (ref >3.0)

## 2020-05-11 LAB — T3: T3, Total: 94 ng/dL (ref 71–180)

## 2020-05-24 ENCOUNTER — Ambulatory Visit (INDEPENDENT_AMBULATORY_CARE_PROVIDER_SITE_OTHER): Payer: No Typology Code available for payment source | Admitting: Family Medicine

## 2020-05-24 ENCOUNTER — Other Ambulatory Visit: Payer: Self-pay

## 2020-05-24 ENCOUNTER — Encounter (INDEPENDENT_AMBULATORY_CARE_PROVIDER_SITE_OTHER): Payer: Self-pay | Admitting: Family Medicine

## 2020-05-24 VITALS — BP 120/70 | HR 66 | Temp 98.0°F | Ht 68.0 in | Wt 245.0 lb

## 2020-05-24 DIAGNOSIS — R7303 Prediabetes: Secondary | ICD-10-CM

## 2020-05-24 DIAGNOSIS — E559 Vitamin D deficiency, unspecified: Secondary | ICD-10-CM | POA: Diagnosis not present

## 2020-05-24 DIAGNOSIS — E7849 Other hyperlipidemia: Secondary | ICD-10-CM

## 2020-05-24 DIAGNOSIS — E66812 Obesity, class 2: Secondary | ICD-10-CM

## 2020-05-24 DIAGNOSIS — Z9189 Other specified personal risk factors, not elsewhere classified: Secondary | ICD-10-CM

## 2020-05-24 DIAGNOSIS — Z6837 Body mass index (BMI) 37.0-37.9, adult: Secondary | ICD-10-CM

## 2020-05-24 MED ORDER — VITAMIN D (ERGOCALCIFEROL) 1.25 MG (50000 UNIT) PO CAPS: 50000.0000 [IU] | ORAL_CAPSULE | ORAL | 0 refills | Status: DC

## 2020-05-24 NOTE — Patient Instructions (Signed)
The 10-year ASCVD risk score Mikey Bussing DC Brooke Bonito., et al., 2013) is: 2.6%   Values used to calculate the score:     Age: 54 years     Sex: Female     Is Non-Hispanic African American: No     Diabetic: No     Tobacco smoker: No     Systolic Blood Pressure: 430 mmHg     Is BP treated: No     HDL Cholesterol: 49 mg/dL     Total Cholesterol: 276 mg/dL d

## 2020-05-25 DIAGNOSIS — R7303 Prediabetes: Secondary | ICD-10-CM | POA: Insufficient documentation

## 2020-05-25 DIAGNOSIS — Z9189 Other specified personal risk factors, not elsewhere classified: Secondary | ICD-10-CM | POA: Insufficient documentation

## 2020-05-25 DIAGNOSIS — E7849 Other hyperlipidemia: Secondary | ICD-10-CM | POA: Insufficient documentation

## 2020-05-25 NOTE — Progress Notes (Signed)
Chief Complaint:   OBESITY Alicia Payne is here to discuss her progress with her obesity treatment plan along with follow-up of her obesity related diagnoses. Alicia Payne is on the Category 3 Plan and states she is following her eating plan approximately 90% of the time. Alicia Payne states she is exercising for 0 minutes 0 times per week.  Today's visit was #: 2 Starting weight: 250 lbs Starting date: 250 lbs Today's weight: 245 lbs Today's date: 05/24/2020 Total lbs lost to date: 5 lbs Total lbs lost since last in-office visit: 5 lbs  Interim History: Alicia Payne says that overall, "it's a lot of food".  "I'm not going crazy not having chocolate."  Overall, she feels it is not hard to follow.  Denies concerns or questions.  For snacks, she will have pretzels and she is taking "alloted amounts" of whatever she chooses.  She says her hunger and cravings are controlled.  She loves [name] bars - "It's like eating cheesecake".  Things are going well and she doesn't want to change anything   Subjective:   1. Other hyperlipidemia Alicia Payne has hyperlipidemia and has been trying to improve her cholesterol levels with intensive lifestyle modification including a low saturated fat diet, exercise and weight loss. She denies any chest pain, claudication or myalgias.  She has elevated LDL and triglycerides.  Her 10 year ASCVD risk is 2.6%.  She was educated on the significance of this.  Lab Results  Component Value Date   ALT 22 05/10/2020   AST 16 05/10/2020   ALKPHOS 76 05/10/2020   BILITOT 0.5 05/10/2020   Lab Results  Component Value Date   CHOL 276 (H) 05/10/2020   HDL 49 05/10/2020   LDLCALC 192 (H) 05/10/2020   TRIG 184 (H) 05/10/2020   CHOLHDL 5.6 (H) 05/10/2020   2. Vitamin D deficiency Alicia Payne's Vitamin D level was 32.9 on 05/10/2020. She is currently taking no vitamin D supplement. She denies nausea, vomiting or muscle weakness.  She endorses fatigue and arthritic aches.  Minimal muscle  aches.  3. Prediabetes Alicia Payne has a diagnosis of prediabetes based on her elevated HgA1c and was informed this puts her at greater risk of developing diabetes. She continues to work on diet and exercise to decrease her risk of diabetes. She denies nausea or hypoglycemia.  No genetic connection - no family history of diabetes.  She was never told her A1c was elevated in the past.  She likes her chocolate and pretzels.  Lab Results  Component Value Date   HGBA1C 6.0 (H) 05/10/2020   Lab Results  Component Value Date   INSULIN 16.9 05/10/2020   4. At risk for diabetes mellitus Alicia Payne is at higher than average risk for developing diabetes due to her obesity and her new diagnosis of prediabetes today.   Assessment/Plan:   1. Other hyperlipidemia Discussed labs with patient today.  Cardiovascular risk and specific lipid/LDL goals reviewed.  We discussed several lifestyle modifications today and Markala will continue to work on diet, exercise and weight loss efforts. Orders and follow up as documented in patient record.  Recheck labs in 3 months after following the meal plan of low saturated and trans fats.  Continue prudent nutritional plan and weight loss.  Counseling Intensive lifestyle modifications are the first line treatment for this issue. . Dietary changes: Increase soluble fiber. Decrease simple carbohydrates. . Exercise changes: Moderate to vigorous-intensity aerobic activity 150 minutes per week if tolerated. . Lipid-lowering medications: see documented in medical record.  2. Vitamin D deficiency New.  Discussed labs with patient today.  Low Vitamin D level contributes to fatigue and are associated with obesity, breast, and colon cancer. She agrees to start to take prescription Vitamin D @50 ,000 IU every week and will follow-up for routine testing of Vitamin D, at least 2-3 times per year to avoid over-replacement.  Recheck vitamin D level in 3 months.  Continue prudent  nutritional plan and weight loss.  - Start Vitamin D, Ergocalciferol, (DRISDOL) 1.25 MG (50000 UNIT) CAPS capsule; Take 1 capsule (50,000 Units total) by mouth every 7 (seven) days.  Dispense: 4 capsule; Refill: 0  3. Prediabetes New.  Discussed labs with patient today. Extensive education done on this new condition for pt.   Recheck insulin and A1c in 3 months.  Continue prudent nutritional plan and weight loss.  Extensive education done.  4. At risk for diabetes mellitus Alicia Payne was given approximately 22+ minutes of diabetes education and counseling today. We discussed intensive lifestyle modifications today with an emphasis on weight loss as well as increasing exercise and decreasing simple carbohydrates in her diet. We also reviewed medication options with an emphasis on risk versus benefit of those discussed.    5. Class 2 severe obesity with serious comorbidity and body mass index (BMI) of 37.0 to 37.9 in adult, unspecified obesity type (HCC) Alicia Payne is currently in the action stage of change. As such, her goal is to continue with weight loss efforts. She has agreed to the Category 3 Plan.   Exercise goals: As is.  Behavioral modification strategies: increasing lean protein intake, decreasing simple carbohydrates, increasing water intake, decreasing eating out, meal planning and cooking strategies, keeping healthy foods in the home and planning for success.   Alicia Payne has agreed to follow-up with our clinic in 2 weeks. She was informed of the importance of frequent follow-up visits to maximize her success with intensive lifestyle modifications for her multiple health conditions.     Objective:   Blood pressure 120/70, pulse 66, temperature 98 F (36.7 C), height 5\' 8"  (1.727 m), weight 245 lb (111.1 kg), last menstrual period 11/20/2014, SpO2 97 %. Body mass index is 37.25 kg/m.  General: Cooperative, alert, well developed, in no acute distress. HEENT: Conjunctivae and lids  unremarkable. Cardiovascular: Regular rhythm.  Lungs: Normal work of breathing. Neurologic: No focal deficits.   Lab Results  Component Value Date   CREATININE 0.74 05/10/2020   BUN 15 05/10/2020   NA 140 05/10/2020   K 5.1 05/10/2020   CL 103 05/10/2020   CO2 24 05/10/2020   Lab Results  Component Value Date   ALT 22 05/10/2020   AST 16 05/10/2020   ALKPHOS 76 05/10/2020   BILITOT 0.5 05/10/2020   Lab Results  Component Value Date   HGBA1C 6.0 (H) 05/10/2020   Lab Results  Component Value Date   INSULIN 16.9 05/10/2020   Lab Results  Component Value Date   TSH 1.220 05/10/2020   Lab Results  Component Value Date   CHOL 276 (H) 05/10/2020   HDL 49 05/10/2020   LDLCALC 192 (H) 05/10/2020   TRIG 184 (H) 05/10/2020   CHOLHDL 5.6 (H) 05/10/2020   Lab Results  Component Value Date   WBC 6.5 05/10/2020   HGB 16.3 (H) 05/10/2020   HCT 48.5 (H) 05/10/2020   MCV 91 05/10/2020   PLT 245 05/10/2020   Attestation Statements:   Reviewed by clinician on day of visit: allergies, medications, problem list, medical history, surgical  history, family history, social history, and previous encounter notes.  I, Water quality scientist, CMA, am acting as Location manager for [employer], DO.  I have reviewed the above documentation for accuracy and completeness, and I agree with the above. Mellody Dance, DO

## 2020-05-27 ENCOUNTER — Encounter: Payer: No Typology Code available for payment source | Admitting: Family

## 2020-06-03 ENCOUNTER — Ambulatory Visit (INDEPENDENT_AMBULATORY_CARE_PROVIDER_SITE_OTHER): Payer: No Typology Code available for payment source | Admitting: Family

## 2020-06-03 ENCOUNTER — Encounter: Payer: Self-pay | Admitting: Family

## 2020-06-03 ENCOUNTER — Other Ambulatory Visit: Payer: Self-pay

## 2020-06-03 ENCOUNTER — Other Ambulatory Visit (HOSPITAL_COMMUNITY)
Admission: RE | Admit: 2020-06-03 | Discharge: 2020-06-03 | Disposition: A | Discharge status: Home / Self Care | Payer: No Typology Code available for payment source | Source: Ambulatory Visit | Attending: Family | Admitting: Family

## 2020-06-03 VITALS — BP 122/68 | HR 72 | Temp 98.1°F | Resp 16 | Ht 68.0 in | Wt 247.0 lb

## 2020-06-03 DIAGNOSIS — Z01419 Encounter for gynecological examination (general) (routine) without abnormal findings: Secondary | ICD-10-CM | POA: Insufficient documentation

## 2020-06-03 DIAGNOSIS — Z1159 Encounter for screening for other viral diseases: Secondary | ICD-10-CM

## 2020-06-03 DIAGNOSIS — Z23 Encounter for immunization: Secondary | ICD-10-CM

## 2020-06-03 DIAGNOSIS — Z114 Encounter for screening for human immunodeficiency virus [HIV]: Secondary | ICD-10-CM

## 2020-06-03 DIAGNOSIS — Z0000 Encounter for general adult medical examination without abnormal findings: Secondary | ICD-10-CM

## 2020-06-03 MED ORDER — ROPINIROLE HCL 1 MG PO TABS: 1.0000 mg | ORAL_TABLET | Freq: Every day | ORAL | 3 refills | Status: DC

## 2020-06-03 MED FILL — rOPINIRole HCL 1 MG TABS: 1 | 30 days supply | Qty: 30 | Fill #0

## 2020-06-03 NOTE — Patient Instructions (Signed)
Please complete lab work prior to leaving. Try to add some regular exercise such as walking. Goal is 30 minutes a day 5 days a week. Continue to work on [employer]. You should be contacted about scheduling your mammogram and colonoscopy.

## 2020-06-03 NOTE — Progress Notes (Signed)
Subjective:    Patient ID: Alicia Payne, female    DOB: 02-Dec-1965, 54 y.o.   MRN: [id number]  HPI   Patient presents today for complete physical.  Immunizations: completed pfizer series, due for Tdap and Shingrix.  Diet: diet is "ok" Wt Readings from Last 3 Encounters:  06/03/20 247 lb (112 kg)  05/24/20 245 lb (111.1 kg)  05/10/20 250 lb (113.4 kg)  Exercise:  Not exercising Colonoscopy: due Pap Smear: due  Mammogram: due Dental: up to date Vision: up to date  She is being treated for a dental infection on the right.     Review of Systems  Constitutional: Negative for unexpected weight change.  HENT: Negative for hearing loss and rhinorrhea.   Eyes: Negative for visual disturbance.  Respiratory: Negative for cough and shortness of breath.   Cardiovascular: Negative for chest pain.  Gastrointestinal: Negative for blood in stool, constipation and diarrhea.  Genitourinary: Negative for difficulty urinating, dysuria, frequency and hematuria.  Musculoskeletal: Negative for arthralgias and myalgias.  Skin: Negative for rash.  Neurological: Negative for headaches.  Hematological: Negative for adenopathy.  Psychiatric/Behavioral:       Denies depression/anxiety   Past Medical History:  Diagnosis Date  . 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 Hot flashes   . Hyperlipidemia   . Joint pain   . Knee pain    left knee  . Obesity   . Phlebitis and thrombophlebitis of superficial vessels of right lower extremity 03/20/2017  . Rosacea   . Trouble in sleeping   . Varicose vein of leg   . Vitamin D deficiency      Social History   Socioeconomic History  . Marital status: Married    Spouse name: Calla Kicks  . Number of children: 2  . Years of education: Not on file  . Highest education level: Not on file  Occupational History  . Occupation: Patent examiner: [employer]  Tobacco Use  . Smoking status: Former Smoker     Packs/day: 0.30    Types: Cigarettes    Start date: 1984    Quit date: 1998    Years since quitting: 23.6  . Smokeless tobacco: Never Used  Vaping Use  . Vaping Use: Never used  Substance and Sexual Activity  . Alcohol use: Yes    Comment: social drinker  . Drug use: No  . Sexual activity: Not Currently    Partners: Male  Other Topics Concern  . Not on file  Social History Narrative   2 daughters- adult (older one in [place] and younger is in [place], [place])   Facilities manager for cardiology   Married (second marriage)   [place] in [place]- moved to [place] at age age 86    Enjoys reading, movies, house Insurance risk surveyor, spending time with family   dog   Social Determinants of Health   Financial Resource Strain:   . Difficulty of Paying Living Expenses: Not on file  Food Insecurity:   . Worried About Charity fundraiser in the Last Year: Not on file  . Ran Out of Food in the Last Year: Not on file  Transportation Needs:   . Lack of Transportation (Medical): Not on file  . Lack of Transportation (Non-Medical): Not on file  Physical Activity: Inactive  . Days of Exercise per Week: 0 days  . Minutes of Exercise per Session: 0 min  Stress:   . Feeling of Stress : Not  on file  Social Connections:   . Frequency of Communication with Friends and Family: Not on file  . Frequency of Social Gatherings with Friends and Family: Not on file  . Attends Religious Services: Not on file  . Active Member of Clubs or Organizations: Not on file  . Attends Archivist Meetings: Not on file  . Marital Status: Not on file  Intimate Partner Violence:   . Fear of Current or Ex-Partner: Not on file  . Emotionally Abused: Not on file  . Physically Abused: Not on file  . Sexually Abused: Not on file    Past Surgical History:  Procedure Laterality Date  . TUBAL LIGATION    . UTERINE FIBROID SURGERY    . VARICOSE VEIN SURGERY      Family History  Problem Relation Age of Onset  .  Cancer Mother        thyroid  . Thyroid cancer Mother 32  . Thyroid nodules Sister        thyroidectomy  . Melanoma Sister        stage 4    No Known Allergies  Current Outpatient Medications on File Prior to Visit  Medication Sig Dispense Refill  . AMBULATORY NON FORMULARY MEDICATION Knee-high, medium compression, graduated compression stockings. Apply to lower extremities. [email or web address], Zippered Compression Stockings, large circ, long length 1 each 0  . amoxicillin (AMOXIL) 500 MG capsule Take 500 mg by mouth 3 (three) times daily.    Marland Kitchen Bioflavonoid Products (BIOFLEX) TABS Take 1 tablet by mouth daily.    . Black Cohosh 160 MG CAPS Take by mouth.    . Calcium Citrate-Vitamin D (CALCIUM + D PO) Take 1 tablet by mouth daily.    . diclofenac sodium (VOLTAREN) 1 % GEL Apply 4 g topically 4 (four) times daily. To affected joint. 100 g 11  . Omega-3 Fatty Acids (FISH OIL PO) Take 1 capsule by mouth daily.    . Vitamin D, Ergocalciferol, (DRISDOL) 1.25 MG (50000 UNIT) CAPS capsule Take 1 capsule (50,000 Units total) by mouth every 7 (seven) days. 4 capsule 0   No current facility-administered medications on file prior to visit.    BP 122/68 (BP Location: Right Arm, Patient Position: Sitting, Cuff Size: Large)   Pulse 72   Temp 98.1 F (36.7 C) (Oral)   Resp 16   Ht 5\' 8"  (1.727 m)   Wt 247 lb (112 kg)   LMP 11/20/2014   SpO2 99%   BMI 37.56 kg/m        Objective:   Physical Exam  Physical Exam  Constitutional: She is oriented to person, place, and time. She appears well-developed and well-nourished. No distress.  HENT:  Head: Normocephalic and atraumatic.  Right Ear: Tympanic membrane and ear canal normal.  Left Ear: Tympanic membrane and ear canal normal.  Mouth/Throat: not examined- pt wearing mask Eyes: Pupils are equal, round, and reactive to light. No scleral icterus.  Neck: Normal range of motion. No thyromegaly present.  Cardiovascular: Normal rate and  regular rhythm.   No murmur heard. Pulmonary/Chest: Effort normal and breath sounds normal. No respiratory distress. He has no wheezes. She has no rales. She exhibits no tenderness.  Abdominal: Soft. Bowel sounds are normal. She exhibits no distension and no mass. There is no tenderness. There is no rebound and no guarding.  Musculoskeletal: She exhibits no edema.  Lymphadenopathy:    She has mild right sided cervical adenopathy secondary to dental infection  Neurological:  She is alert and oriented to person, place, and time. She has normal patellar reflexes. She exhibits normal muscle tone. Coordination normal.  Skin: Skin is warm and dry.  Psychiatric: She has a normal mood and affect. Her behavior is normal. Judgment and thought content normal.  Breasts: Examined lying Right: Without masses, retractions, discharge or axillary adenopathy.  Left: Without masses, retractions, discharge or axillary adenopathy.  Inguinal/mons: Normal without inguinal adenopathy  External genitalia: Normal  BUS/Urethra/Skene's glands: Normal  Bladder: Normal  Vagina: Normal  Cervix: Normal  Uterus: normal in size, shape and contour. Midline and mobile  Adnexa/parametria:  Rt: Without masses or tenderness.  Lt: Without masses or tenderness.  Anus and perineum: Normal            Assessment & Plan:   Preventative care- discussed diet/exercise/weight loss.  Refer for colo. Pap performed today. Refer for mammo. Shingrix #1 and Td today.  Will need Pfizer #3- advised her to wait at least 2 weeks prior to getting this vaccine.  This visit occurred during the SARS-CoV-2 public health emergency.  Safety protocols were in place, including screening questions prior to the visit, additional usage of staff PPE, and extensive cleaning of exam room while observing appropriate contact time as indicated for disinfecting solutions.        Assessment & Plan:

## 2020-06-07 LAB — CYTOLOGY - PAP
Comment: NEGATIVE
Diagnosis: NEGATIVE
High risk HPV: NEGATIVE

## 2020-06-08 ENCOUNTER — Other Ambulatory Visit: Payer: Self-pay

## 2020-06-08 ENCOUNTER — Ambulatory Visit (INDEPENDENT_AMBULATORY_CARE_PROVIDER_SITE_OTHER): Payer: No Typology Code available for payment source | Admitting: Family Medicine

## 2020-06-08 ENCOUNTER — Encounter (INDEPENDENT_AMBULATORY_CARE_PROVIDER_SITE_OTHER): Payer: Self-pay | Admitting: Family Medicine

## 2020-06-08 VITALS — BP 111/69 | HR 68 | Temp 98.4°F | Ht 68.0 in | Wt 243.0 lb

## 2020-06-08 DIAGNOSIS — E559 Vitamin D deficiency, unspecified: Secondary | ICD-10-CM | POA: Diagnosis not present

## 2020-06-08 DIAGNOSIS — R4589 Other symptoms and signs involving emotional state: Secondary | ICD-10-CM | POA: Diagnosis not present

## 2020-06-08 DIAGNOSIS — Z6837 Body mass index (BMI) 37.0-37.9, adult: Secondary | ICD-10-CM

## 2020-06-08 DIAGNOSIS — R7303 Prediabetes: Secondary | ICD-10-CM | POA: Diagnosis not present

## 2020-06-08 DIAGNOSIS — E66812 Obesity, class 2: Secondary | ICD-10-CM

## 2020-06-09 NOTE — Progress Notes (Signed)
Chief Complaint:   OBESITY Alicia Payne is here to discuss her progress with her obesity treatment plan along with follow-up of her obesity related diagnoses. Alicia Payne is on the Category 3 Plan and states she is following her eating plan approximately 80-90% of the time. Alicia Payne states she is exercising for 0 minutes 0 times per week.  Today's visit was #: 3 Starting weight: 250 lbs Starting date: 05/10/2020 Today's weight: 243 lbs Today's date: 06/08/2020 Total lbs lost to date: 7 lbs Total lbs lost since last in-office visit: 2 lbs  Interim History: Alicia Payne did well after 2 weeks.  She did some eating out at dinnertime.  She eats breakfast and lunch exactly as is.  At night, she does "snacking" or "picking at her husband's comfort food".    Subjective:   1. Vitamin D deficiency Alicia Payne's Vitamin D level was 32.9 on 05/10/2020. She is currently taking prescription vitamin D 50,000 IU each week. She denies nausea, vomiting or muscle weakness.  2. Prediabetes Alicia Payne has a diagnosis of prediabetes based on her elevated HgA1c and was informed this puts her at greater risk of developing diabetes. She continues to work on diet and exercise to decrease her risk of diabetes. She denies nausea or hypoglycemia.  Lab Results  Component Value Date   HGBA1C 6.0 (H) 05/10/2020   Lab Results  Component Value Date   INSULIN 16.9 05/10/2020   3. Depressed mood She has the stress of her husband's illness and knows there is no cure.  She is tearful in the office today.  She does not feel she needs a medication to help her mood.  Denies SI or "significant problems".  Assessment/Plan:   1. Vitamin D deficiency Low Vitamin D level contributes to fatigue and are associated with obesity, breast, and colon cancer. She agrees to continue to take prescription Vitamin D @50 ,000 IU every week and will follow-up for routine testing of Vitamin D, at least 2-3 times per year to avoid  over-replacement.  2. Prediabetes Alicia Payne will continue to work on weight loss, exercise, and decreasing simple carbohydrates to help decrease the risk of diabetes.  Recheck labs in early November.  Continue prudent nutritional plan, weight loss.  3. Depressed mood Walk daily, obtain counseling via [facility] via [facility] or through [facility] (support groups, etc.).  4. Class 2 severe obesity with serious comorbidity and body mass index (BMI) of 37.0 to 37.9 in adult, unspecified obesity type (HCC) Alicia Payne is currently in the action stage of change. As such, her goal is to continue with weight loss efforts. She has agreed to the Category 3 Plan.   Exercise goals: Add 15+ minutes per day of walking/exercise for stress management.  Behavioral modification strategies: increasing lean protein intake, meal planning and cooking strategies, ways to avoid night time snacking, better snacking choices and planning for success.  She is under the stress of her husband's stage 4 cancer.  She will call [facility] or obtain counseling through the cancer center.  She has great support of co-workers/friends.  Alicia Payne has agreed to follow-up with our clinic in 2 weeks. She was informed of the importance of frequent follow-up visits to maximize her success with intensive lifestyle modifications for her multiple health conditions.   Objective:   Blood pressure 111/69, pulse 68, temperature 98.4 F (36.9 C), height 5\' 8"  (1.727 m), weight 243 lb (110.2 kg), last menstrual period 11/20/2014, SpO2 97 %. Body mass index is 36.95 kg/m.  General: Cooperative, alert, well  developed, in no acute distress. HEENT: Conjunctivae and lids unremarkable. Cardiovascular: Regular rhythm.  Lungs: Normal work of breathing. Neurologic: No focal deficits.   Lab Results  Component Value Date   CREATININE 0.74 05/10/2020   BUN 15 05/10/2020   NA 140 05/10/2020   K 5.1 05/10/2020   CL 103 05/10/2020   CO2 24 05/10/2020   Lab  Results  Component Value Date   ALT 22 05/10/2020   AST 16 05/10/2020   ALKPHOS 76 05/10/2020   BILITOT 0.5 05/10/2020   Lab Results  Component Value Date   HGBA1C 6.0 (H) 05/10/2020   Lab Results  Component Value Date   INSULIN 16.9 05/10/2020   Lab Results  Component Value Date   TSH 1.220 05/10/2020   Lab Results  Component Value Date   CHOL 276 (H) 05/10/2020   HDL 49 05/10/2020   LDLCALC 192 (H) 05/10/2020   TRIG 184 (H) 05/10/2020   CHOLHDL 5.6 (H) 05/10/2020   Lab Results  Component Value Date   WBC 6.5 05/10/2020   HGB 16.3 (H) 05/10/2020   HCT 48.5 (H) 05/10/2020   MCV 91 05/10/2020   PLT 245 05/10/2020   Attestation Statements:   Reviewed by clinician on day of visit: allergies, medications, problem list, medical history, surgical history, family history, social history, and previous encounter notes.  Time spent on visit including pre-visit chart review and post-visit care and charting was 31 minutes.   I, Water quality scientist, CMA, am acting as Location manager for [employer], DO.  I have reviewed the above documentation for accuracy and completeness, and I agree with the above. Mellody Dance, DO

## 2020-06-22 ENCOUNTER — Encounter (INDEPENDENT_AMBULATORY_CARE_PROVIDER_SITE_OTHER): Payer: Self-pay | Admitting: Family Medicine

## 2020-06-22 ENCOUNTER — Other Ambulatory Visit: Payer: Self-pay

## 2020-06-22 ENCOUNTER — Ambulatory Visit (INDEPENDENT_AMBULATORY_CARE_PROVIDER_SITE_OTHER): Payer: No Typology Code available for payment source | Admitting: Family Medicine

## 2020-06-22 VITALS — BP 117/76 | HR 65 | Temp 98.5°F | Ht 68.0 in | Wt 240.0 lb

## 2020-06-22 DIAGNOSIS — F430 Acute stress reaction: Secondary | ICD-10-CM | POA: Diagnosis not present

## 2020-06-22 DIAGNOSIS — E559 Vitamin D deficiency, unspecified: Secondary | ICD-10-CM

## 2020-06-22 DIAGNOSIS — Z9189 Other specified personal risk factors, not elsewhere classified: Secondary | ICD-10-CM

## 2020-06-22 DIAGNOSIS — R7303 Prediabetes: Secondary | ICD-10-CM

## 2020-06-22 DIAGNOSIS — Z6836 Body mass index (BMI) 36.0-36.9, adult: Secondary | ICD-10-CM

## 2020-06-22 MED ORDER — BUPROPION HCL 75 MG PO TABS: ORAL_TABLET | ORAL | 0 refills | Status: DC

## 2020-06-22 MED ORDER — VITAMIN D (ERGOCALCIFEROL) 1.25 MG (50000 UNIT) PO CAPS: 50000.0000 [IU] | ORAL_CAPSULE | ORAL | 0 refills | Status: DC

## 2020-06-22 MED FILL — VIT D2 1.25 MG (50,000 UNIT: 1.25 MG | 28 days supply | Qty: 4 | Fill #0

## 2020-06-22 MED FILL — BUPROPION HCL 75 MG TABS: 75 | 30 days supply | Qty: 30 | Fill #0

## 2020-06-26 NOTE — Progress Notes (Signed)
Chief Complaint:   OBESITY Alicia Payne is here to discuss her progress with her obesity treatment plan along with follow-up of her obesity related diagnoses. Alicia Payne is on the Category 3 Plan and states she is following her eating plan approximately 80-90% of the time. Alicia Payne states she is walking for 20-30 minutes 4 times per week.  Today's visit was #: 4 Starting weight: 250 lbs Starting date: 05/10/2020 Today's weight: 240 lbs Today's date: 06/22/2020 Total lbs lost to date: 10 lbs Total lbs lost since last in-office visit: 3 lbs  Interim History:   Alicia Payne says she has been sticking to the plan at work, eating exactly what she should.    At home, after dinner, she is doing more snacking/having more cravings, but says it is out of boredom.    She is using snack calories for clio bars and another apple.    Subjective:   1. Acute reaction to stress, emotional eating Since last visit, during which she declined medication or counseling referral, she now thinks medication may help with her cravings/boredom eating at night.   2. Prediabetes Alicia Payne has a diagnosis of prediabetes based on her elevated HgA1c and was informed this puts her at greater risk of developing diabetes. She continues to work on diet and exercise to decrease her risk of diabetes. She denies nausea or hypoglycemia.  Lab Results  Component Value Date   HGBA1C 6.0 (H) 05/10/2020   Lab Results  Component Value Date   INSULIN 16.9 05/10/2020   3. Vitamin D deficiency Alicia Payne Vitamin D level was 32.9 on 05/10/2020. She is currently taking prescription vitamin D 50,000 IU each week. She denies nausea, vomiting or muscle weakness.  Still with fatigue, but may be improved with medication.  4. At risk for medication nonadherence Alicia Payne is at risk for nonadherence with medication.  Assessment/Plan:   1. Acute reaction to stress, emotional eating Will start her on Wellbutrin 75 mg daily at lunch, as  per below.  Risks and benefits of medication were discussed with her. Pt declines referral for counseling but I encouraged pt to call [facility] for appt w a counselor.   -Start buPROPion (WELLBUTRIN) 75 MG tablet; 1 po qd  Dispense: 30 tablet; Refill: 0   2. Prediabetes Alicia Payne will continue to work on weight loss, exercise, and decreasing simple carbohydrates to help decrease the risk of diabetes.    3. Vitamin D deficiency Low Vitamin D level contributes to fatigue and are associated with obesity, breast, and colon cancer. She agrees to continue to take prescription Vitamin D @50 ,000 IU every week and will follow-up for routine testing of Vitamin D, at least 2-3 times per year to avoid over-replacement.  -Refill Vitamin D, Ergocalciferol, (DRISDOL) 1.25 MG (50000 UNIT) CAPS capsule; Take 1 capsule (50,000 Units total) by mouth every 7 (seven) days.  Dispense: 4 capsule; Refill: 0   4. At risk for medication nonadherence Alicia Payne was given approximately 15 minutes of drug side effect counseling today.  We discussed side effect possibility and risk versus benefits. Alicia Payne agreed to the medication and will contact this office if these side effects are intolerable.   5. Class 2 severe obesity with serious comorbidity and body mass index (BMI) of 36.0 to 36.9 in adult, unspecified obesity type (HCC) Alicia Payne is currently in the action stage of change. As such, her goal is to continue with weight loss efforts. She has agreed to the Category 3 Plan.   Exercise goals: As is.  Behavioral modification strategies: increasing lean protein intake, decreasing simple carbohydrates, meal planning and cooking strategies and planning for success.  Alicia Payne has agreed to follow-up with our clinic in 2 weeks. She was informed of the importance of frequent follow-up visits to maximize her success with intensive lifestyle modifications for her multiple health conditions.   Objective:   Blood pressure 117/76,  pulse 65, temperature 98.5 F (36.9 C), height 5\' 8"  (1.727 m), weight 240 lb (108.9 kg), last menstrual period 11/20/2014, SpO2 99 %. Body mass index is 36.49 kg/m.  General: Cooperative, alert, well developed, in no acute distress. HEENT: Conjunctivae and lids unremarkable. Cardiovascular: Regular rhythm.  Lungs: Normal work of breathing. Neurologic: No focal deficits.   Lab Results  Component Value Date   CREATININE 0.74 05/10/2020   BUN 15 05/10/2020   NA 140 05/10/2020   K 5.1 05/10/2020   CL 103 05/10/2020   CO2 24 05/10/2020   Lab Results  Component Value Date   ALT 22 05/10/2020   AST 16 05/10/2020   ALKPHOS 76 05/10/2020   BILITOT 0.5 05/10/2020   Lab Results  Component Value Date   HGBA1C 6.0 (H) 05/10/2020   Lab Results  Component Value Date   INSULIN 16.9 05/10/2020   Lab Results  Component Value Date   TSH 1.220 05/10/2020   Lab Results  Component Value Date   CHOL 276 (H) 05/10/2020   HDL 49 05/10/2020   LDLCALC 192 (H) 05/10/2020   TRIG 184 (H) 05/10/2020   CHOLHDL 5.6 (H) 05/10/2020   Lab Results  Component Value Date   WBC 6.5 05/10/2020   HGB 16.3 (H) 05/10/2020   HCT 48.5 (H) 05/10/2020   MCV 91 05/10/2020   PLT 245 05/10/2020   Attestation Statements:   Reviewed by clinician on day of visit: allergies, medications, problem list, medical history, surgical history, family history, social history, and previous encounter notes.  I, Water quality scientist, CMA, am acting as Location manager for [employer], DO.  I have reviewed the above documentation for accuracy and completeness, and I agree with the above. -  Mellody Dance, DO

## 2020-07-11 ENCOUNTER — Encounter (INDEPENDENT_AMBULATORY_CARE_PROVIDER_SITE_OTHER): Payer: Self-pay | Admitting: Family Medicine

## 2020-07-11 ENCOUNTER — Other Ambulatory Visit: Payer: Self-pay

## 2020-07-11 ENCOUNTER — Ambulatory Visit (INDEPENDENT_AMBULATORY_CARE_PROVIDER_SITE_OTHER): Payer: No Typology Code available for payment source | Admitting: Family Medicine

## 2020-07-11 ENCOUNTER — Other Ambulatory Visit (INDEPENDENT_AMBULATORY_CARE_PROVIDER_SITE_OTHER): Payer: Self-pay | Admitting: Family Medicine

## 2020-07-11 VITALS — BP 110/73 | HR 59 | Temp 97.9°F | Ht 68.0 in | Wt 235.0 lb

## 2020-07-11 DIAGNOSIS — Z6835 Body mass index (BMI) 35.0-35.9, adult: Secondary | ICD-10-CM

## 2020-07-11 DIAGNOSIS — E559 Vitamin D deficiency, unspecified: Secondary | ICD-10-CM

## 2020-07-11 DIAGNOSIS — F430 Acute stress reaction: Secondary | ICD-10-CM | POA: Diagnosis not present

## 2020-07-11 DIAGNOSIS — Z9189 Other specified personal risk factors, not elsewhere classified: Secondary | ICD-10-CM

## 2020-07-11 MED ORDER — BUPROPION HCL 100 MG PO TABS: ORAL_TABLET | ORAL | 0 refills | Status: DC

## 2020-07-11 MED FILL — buPROPion HCL 100 MG TABS: 100 | 30 days supply | Qty: 30 | Fill #0

## 2020-07-12 NOTE — Progress Notes (Signed)
Chief Complaint:   OBESITY Alicia Payne is here to discuss her progress with her obesity treatment plan along with follow-up of her obesity related diagnoses. Alicia Payne is on the Category 3 Plan and states she is following her eating plan approximately 80-90% of the time. Alicia Payne states she is walking for 15-20 minutes 3 times per week.  Today's visit was #: 5 Starting weight: 250 lbs Starting date: 05/10/2020 Today's weight: 235 lbs Today's date: 07/11/2020 Total lbs lost to date: 15 lbs Total lbs lost since last in-office visit: 5 lbs  Interim History: Alicia Payne thinks the meal plan is going well.  Hunger is controlled.  Cravings are not an issue for her.  Overall, she likes the plan and has no complaints or concerns.  She is wondering how well she is doing with her weight loss.  She wonders if she is losing too little.  Assessment/Plan:   1. Vitamin D deficiency Alicia Payne has a history of Vitamin D deficiency with resultant generalized fatigue as her primary symptom.  she is taking vitamin D 50,000 IU weekly for this deficiency and tolerating it well without side-effect.   Most recent Vitamin D lab reviewed-  level: 32.9  Plan:  - Discussed importance of vitamin D (as well as calcium) to their health and well-being.   - We reviewed possible symptoms of low Vitamin D including low energy, depressed mood, muscle aches, joint aches, osteoporosis etc.  - We discussed that low Vitamin D levels may be linked to an increased risk of cardiovascular events and even increased risk of cancers- such as colon and breast.   - Educated pt that weight loss will likely improve availability of vitamin D, thus encouraged Alicia Payne to continue with meal plan and their weight loss efforts to further improve this condition  - I recommend pt take a weekly prescription vit D- see script below- which pt agrees to after discussion of risks and benefits of this medication.      - Informed  patient this may be a lifelong thing, and she was encouraged to continue to take the medicine until pt told otherwise.   We will need to monitor levels regularly ( q 3-4 mo on average )  to keep levels within normal limits.   - All pt's questions and concerns regarding this condition addressed.  Recheck vitamin D level in 3 months.   2. Acute reaction to stress, with emotional eating She started Wellbutrin at last office visit.  She is tolerating it well and thinks it may be helping with her mood and cravings.  Plan:  Increase Wellbutrin to 100 mg.  Counseling done regarding medication side effects and benefits.  Discussed emotional eating strategies.  -Increase buPROPion (WELLBUTRIN) 100 MG tablet; 1 po qd  Dispense: 30 tablet; Refill: 0  Meds ordered this encounter  Medications  . buPROPion (WELLBUTRIN) 100 MG tablet    Sig: 1 po qd    Dispense:  30 tablet    Refill:  0    3. At risk medication non-adherence  Due to Alicia Payne's current state of health and medical condition(s), they are at a significantly higher risk for medication non-adherence.   This further also puts the patient at much greater risk to also subsequently develop other psychiatric conditions that can negatively affect patient's quality of life as well.  At least 8 minutes was spent on counseling Alicia Payne about these concerns today and I stressed the importance of taking medications as written.  Risks/ benefits of medications were discussed in detail prior to increasing dose today.    Counseling: Intensive lifestyle modifications discussed with Alicia Payne as most appropriate first line treatment.  she will continue to work on diet, exercise and weight loss efforts.  We will continue to reassess these conditions on a fairly regular basis in an attempt to decrease patient's overall morbidity and mortality   4. Class 2 severe obesity with serious comorbidity and body mass index (BMI) of 35.0 to 35.9 in adult, unspecified  obesity type (HCC)  Alicia Payne is currently in the action stage of change. As such, her goal is to continue with weight loss efforts. She has agreed to the Category 3 Plan.   Exercise goals: For substantial health benefits, adults should do at least 150 minutes (2 hours and 30 minutes) a week of moderate-intensity, or 75 minutes (1 hour and 15 minutes) a week of vigorous-intensity aerobic physical activity, or an equivalent combination of moderate- and vigorous-intensity aerobic activity. Aerobic activity should be performed in episodes of at least 10 minutes, and preferably, it should be spread throughout the week. try to increase to 30 minutes 5 days per week.  Behavioral modification strategies: increasing lean protein intake, meal planning and cooking strategies, keeping healthy foods in the home, better snacking choices, emotional eating strategies and planning for success.  Alicia Payne has agreed to follow-up with our clinic in 2 weeks. She was informed of the importance of frequent follow-up visits to maximize her success with intensive lifestyle modifications for her multiple health conditions.   Objective:   Blood pressure 110/73, pulse (!) 59, temperature 97.9 F (36.6 C), height 5\' 8"  (1.727 m), weight 235 lb (106.6 kg), last menstrual period 11/20/2014, SpO2 98 %. Body mass index is 35.73 kg/m.  General: Cooperative, alert, well developed, in no acute distress. HEENT: Conjunctivae and lids unremarkable. Cardiovascular: Regular rhythm.  Lungs: Normal work of breathing. Neurologic: No focal deficits.   Lab Results  Component Value Date   CREATININE 0.74 05/10/2020   BUN 15 05/10/2020   NA 140 05/10/2020   K 5.1 05/10/2020   CL 103 05/10/2020   CO2 24 05/10/2020   Lab Results  Component Value Date   ALT 22 05/10/2020   AST 16 05/10/2020   ALKPHOS 76 05/10/2020   BILITOT 0.5 05/10/2020   Lab Results  Component Value Date   HGBA1C 6.0 (H) 05/10/2020   Lab Results    Component Value Date   INSULIN 16.9 05/10/2020   Lab Results  Component Value Date   TSH 1.220 05/10/2020   Lab Results  Component Value Date   CHOL 276 (H) 05/10/2020   HDL 49 05/10/2020   LDLCALC 192 (H) 05/10/2020   TRIG 184 (H) 05/10/2020   CHOLHDL 5.6 (H) 05/10/2020   Lab Results  Component Value Date   WBC 6.5 05/10/2020   HGB 16.3 (H) 05/10/2020   HCT 48.5 (H) 05/10/2020   MCV 91 05/10/2020   PLT 245 05/10/2020   Attestation Statements:   Reviewed by clinician on day of visit: allergies, medications, problem list, medical history, surgical history, family history, social history, and previous encounter notes.  I, Water quality scientist, CMA, am acting as Location manager for [employer], DO.  I have reviewed the above documentation for accuracy and completeness, and I agree with the above. Marjory Sneddon, D.O.  [place] was signed into law in 2016 which includes the topic of electronic health records.  This provides immediate  access to information in MyChart.  This includes consultation notes, operative notes, office notes, lab results and pathology reports.  If you have any questions about what you read please let us know at your next visit so we can discuss your concerns and take corrective action if need be.  We are right here with you.

## 2020-07-27 ENCOUNTER — Ambulatory Visit (INDEPENDENT_AMBULATORY_CARE_PROVIDER_SITE_OTHER): Payer: No Typology Code available for payment source | Admitting: Family Medicine

## 2020-07-27 ENCOUNTER — Other Ambulatory Visit: Payer: Self-pay

## 2020-07-27 ENCOUNTER — Other Ambulatory Visit (INDEPENDENT_AMBULATORY_CARE_PROVIDER_SITE_OTHER): Payer: Self-pay | Admitting: Family Medicine

## 2020-07-27 ENCOUNTER — Encounter (INDEPENDENT_AMBULATORY_CARE_PROVIDER_SITE_OTHER): Payer: Self-pay | Admitting: Family Medicine

## 2020-07-27 VITALS — BP 121/80 | HR 61 | Temp 97.7°F | Ht 68.0 in | Wt 234.0 lb

## 2020-07-27 DIAGNOSIS — R7303 Prediabetes: Secondary | ICD-10-CM

## 2020-07-27 DIAGNOSIS — E559 Vitamin D deficiency, unspecified: Secondary | ICD-10-CM

## 2020-07-27 DIAGNOSIS — F430 Acute stress reaction: Secondary | ICD-10-CM

## 2020-07-27 DIAGNOSIS — Z9189 Other specified personal risk factors, not elsewhere classified: Secondary | ICD-10-CM | POA: Diagnosis not present

## 2020-07-27 DIAGNOSIS — Z6835 Body mass index (BMI) 35.0-35.9, adult: Secondary | ICD-10-CM

## 2020-07-27 MED ORDER — VITAMIN D (ERGOCALCIFEROL) 1.25 MG (50000 UNIT) PO CAPS: 50000.0000 [IU] | ORAL_CAPSULE | ORAL | 0 refills | Status: DC

## 2020-07-27 MED FILL — VIT D2 1.25 MG (50,000 UNIT: 1.25 MG | 28 days supply | Qty: 4 | Fill #0

## 2020-07-27 NOTE — Patient Instructions (Signed)
The 10-year ASCVD risk score Mikey Bussing DC Brooke Bonito., et al., 2013) is: 2.8%   Values used to calculate the score:     Age: 54 years     Sex: Female     Is Non-Hispanic African American: No     Diabetic: No     Tobacco smoker: No     Systolic Blood Pressure: 748 mmHg     Is BP treated: No     HDL Cholesterol: 49 mg/dL     Total Cholesterol: 276 mg/dL

## 2020-08-01 NOTE — Progress Notes (Signed)
Chief Complaint:   OBESITY Alicia Payne is here to discuss her progress with her obesity treatment plan along with follow-up of her obesity related diagnoses. Alicia Payne is on the Category 3 Plan and states she is following her eating plan approximately 80-90% of the time. Alicia Payne states she is walking for 20 minutes 4 times per week.  Today's visit was #: 6 Starting weight: 250 lbs Starting date: 05/10/2020 Today's weight: 234 lbs Today's date: 07/27/2020 Total lbs lost to date: 16 lbs Total lbs lost since last in-office visit: 1 lb Total weight loss percentage to date: -6.40%  Interim History: Alicia Payne says she has been getting in all her foods per day.  She says the plan is working well and she likes it.  She is occasionally getting cravings for sweets, but not too bad, she says.  She will be going to [facility] for a wedding next week for 3 days and will be staying in a hotel.  Assessment/Plan:   Meds ordered this encounter  Medications  . Vitamin D, Ergocalciferol, (DRISDOL) 1.25 MG (50000 UNIT) CAPS capsule    Sig: Take 1 capsule (50,000 Units total) by mouth every 7 (seven) days.    Dispense:  4 capsule    Refill:  0    1. Vitamin D deficiency Alicia Payne has a history of Vitamin D deficiency with resultant generalized fatigue as her primary symptom.  she is taking vitamin D 50,000 IU weekly for this deficiency and tolerating it well without side-effect.  Most recent Vitamin D lab reviewed-  level: 32.9 on 05/10/2020.  Plan:  - Reiterated importance of vitamin D (as well as calcium) to their health and wellbeing.  - Reminded pt that weight loss will likely improve availability of vitamin D, thus encouraged Alicia Payne to continue with meal plan and their weight loss efforts to further improve this condition. - I recommend pt continue to take weekly prescription vit D 50,000 IU - Informed patient this may be a lifelong thing, and she was encouraged to continue to take the  medicine until told otherwise.   -We will need to monitor levels regularly (every 3-4 mo on average) to keep levels within normal limits.  - All pt's questions and concerns regarding this condition addressed. -Refill Vitamin D, Ergocalciferol, (DRISDOL) 1.25 MG (50000 UNIT) CAPS capsule; Take 1 capsule (50,000 Units total) by mouth every 7 (seven) days.  Dispense: 4 capsule; Refill: 0    2. Prediabetes She is having some sweets cravings, but "it is manageable for now", she says.    Plan:  We discussed medications for help with sweets cravings, but she does not want to start.  We discussed strategies.  Recheck A1c and fasting insulin at the end of November.  Lab Results  Component Value Date   HGBA1C 6.0 (H) 05/10/2020   Lab Results  Component Value Date   INSULIN 16.9 05/10/2020     3. Acute reaction to stress with emotional eating Started Wellbutrin on 06/22/2020.  Tolerating well.  Helping with making things more tolerable.  Plan:  Continue Wellbutrin.  She declines need for increase to twice daily.  She has only been on the medication for 6 weeks or so.  Continue monitoring.    4. At risk for diabetes mellitus - Alicia Payne was given extensive diabetes prevention education and counseling today of more than 10 minutes.  - Counseled patient on pathophysiology of disease and discussed various treatment options which always includes dietary and lifestyle modification as  first line.   - Importance of healthy diet with very limited amounts of simple carbohydrates discussed with patient in addition to regular aerobic exercise to an eventual goal of 26min 5d/week or more.  - Handouts provided at patient's desire and or told to go online at the American Diabetes Association website for further information    5. Class 2 severe obesity with serious comorbidity and body mass index (BMI) of 35.0 to 35.9 in adult, unspecified obesity type (HCC)  Alicia Payne is currently in the action stage of  change. As such, her goal is to continue with weight loss efforts. She has agreed to the Category 3 Plan.   Exercise goals: For substantial health benefits, adults should do at least 150 minutes (2 hours and 30 minutes) a week of moderate-intensity, or 75 minutes (1 hour and 15 minutes) a week of vigorous-intensity aerobic physical activity, or an equivalent combination of moderate- and vigorous-intensity aerobic activity. Aerobic activity should be performed in episodes of at least 10 minutes, and preferably, it should be spread throughout the week.  Behavioral modification strategies: travel eating strategies and celebration eating strategies.  Alicia Payne has agreed to follow-up with our clinic in 2 weeks. She was informed of the importance of frequent follow-up visits to maximize her success with intensive lifestyle modifications for her multiple health conditions.   Objective:   Blood pressure 121/80, pulse 61, temperature 97.7 F (36.5 C), height 5\' 8"  (1.727 m), weight 234 lb (106.1 kg), last menstrual period 11/20/2014, SpO2 97 %. Body mass index is 35.58 kg/m.  General: Cooperative, alert, well developed, in no acute distress. HEENT: Conjunctivae and lids unremarkable. Cardiovascular: Regular rhythm.  Lungs: Normal work of breathing. Neurologic: No focal deficits.   Lab Results  Component Value Date   CREATININE 0.74 05/10/2020   BUN 15 05/10/2020   NA 140 05/10/2020   K 5.1 05/10/2020   CL 103 05/10/2020   CO2 24 05/10/2020   Lab Results  Component Value Date   ALT 22 05/10/2020   AST 16 05/10/2020   ALKPHOS 76 05/10/2020   BILITOT 0.5 05/10/2020   Lab Results  Component Value Date   HGBA1C 6.0 (H) 05/10/2020   Lab Results  Component Value Date   INSULIN 16.9 05/10/2020   Lab Results  Component Value Date   TSH 1.220 05/10/2020   Lab Results  Component Value Date   CHOL 276 (H) 05/10/2020   HDL 49 05/10/2020   LDLCALC 192 (H) 05/10/2020   TRIG 184 (H)  05/10/2020   CHOLHDL 5.6 (H) 05/10/2020   Lab Results  Component Value Date   WBC 6.5 05/10/2020   HGB 16.3 (H) 05/10/2020   HCT 48.5 (H) 05/10/2020   MCV 91 05/10/2020   PLT 245 05/10/2020   Attestation Statements:   Reviewed by clinician on day of visit: allergies, medications, problem list, medical history, surgical history, family history, social history, and previous encounter notes.  I, Water quality scientist, CMA, am acting as Location manager for [employer], DO.  I have reviewed the above documentation for accuracy and completeness, and I agree with the above. Marjory Sneddon, D.O.  [place] was signed into law in 2016 which includes the topic of electronic health records.  This provides immediate access to information in MyChart.  This includes consultation notes, operative notes, office notes, lab results and pathology reports.  If you have any questions about what you read please let us know at your next visit so  we can discuss your concerns and take corrective action if need be.  We are right here with you.

## 2020-08-04 ENCOUNTER — Ambulatory Visit (INDEPENDENT_AMBULATORY_CARE_PROVIDER_SITE_OTHER): Payer: No Typology Code available for payment source | Admitting: Internal Medicine

## 2020-08-04 ENCOUNTER — Ambulatory Visit (HOSPITAL_BASED_OUTPATIENT_CLINIC_OR_DEPARTMENT_OTHER)
Admission: RE | Admit: 2020-08-04 | Discharge: 2020-08-04 | Disposition: A | Discharge status: Home / Self Care | Payer: No Typology Code available for payment source | Source: Ambulatory Visit | Attending: Internal Medicine | Admitting: Internal Medicine

## 2020-08-04 ENCOUNTER — Other Ambulatory Visit: Payer: Self-pay

## 2020-08-04 ENCOUNTER — Encounter: Payer: Self-pay | Admitting: Internal Medicine

## 2020-08-04 VITALS — BP 142/75 | HR 55 | Temp 97.8°F | Resp 18 | Ht 68.0 in | Wt 237.2 lb

## 2020-08-04 DIAGNOSIS — L03113 Cellulitis of right upper limb: Secondary | ICD-10-CM | POA: Diagnosis not present

## 2020-08-04 DIAGNOSIS — M79641 Pain in right hand: Secondary | ICD-10-CM | POA: Diagnosis not present

## 2020-08-04 MED ORDER — DOXYCYCLINE HYCLATE 100 MG PO TABS: 100.0000 mg | ORAL_TABLET | Freq: Two times a day (BID) | ORAL | 0 refills | Status: DC

## 2020-08-04 NOTE — Progress Notes (Signed)
Subjective:    Patient ID: Alicia Payne, female    DOB: 1966-07-21, 54 y.o.   MRN: [id number]  DOS:  08/04/2020 Type of visit - description: Acute 4 days ago developed some pain on the right hand, today for the first time noticed swelling, redness and some warmness.  She has no previous episodes like this one. No fever chills No injury No history of gout    Review of Systems See above   Past Medical History:  Diagnosis Date  . 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 Hot flashes   . Hyperlipidemia   . Joint pain   . Knee pain    left knee  . Obesity   . Phlebitis and thrombophlebitis of superficial vessels of right lower extremity 03/20/2017  . Rosacea   . Trouble in sleeping   . Varicose vein of leg   . Vitamin D deficiency     Past Surgical History:  Procedure Laterality Date  . TUBAL LIGATION    . UTERINE FIBROID SURGERY    . VARICOSE VEIN SURGERY      Allergies as of 08/04/2020   No Known Allergies     Medication List       Accurate as of August 04, 2020 11:59 PM. If you have any questions, ask your nurse or doctor.        AMBULATORY NON FORMULARY MEDICATION Knee-high, medium compression, graduated compression stockings. Apply to lower extremities. [email or web address], Zippered Compression Stockings, large circ, long length   Bioflex Tabs Take 1 tablet by mouth daily.   Black Cohosh 160 MG Caps Take by mouth.   buPROPion 100 MG tablet Commonly known as: WELLBUTRIN 1 po qd   CALCIUM + D PO Take 1 tablet by mouth daily.   diclofenac sodium 1 % Gel Commonly known as: VOLTAREN Apply 4 g topically 4 (four) times daily. To affected joint.   doxycycline 100 MG tablet Commonly known as: VIBRA-TABS Take 1 tablet (100 mg total) by mouth 2 (two) times daily. Started by: Kathlene November, MD   FISH OIL PO Take 1 capsule by mouth daily.   rOPINIRole 1 MG tablet Commonly known as: Requip Take 1 tablet (1 mg total) by mouth at  bedtime.   Vitamin D (Ergocalciferol) 1.25 MG (50000 UNIT) Caps capsule Commonly known as: DRISDOL Take 1 capsule (50,000 Units total) by mouth every 7 (seven) days.          Objective:   Physical Exam BP (!) 142/75 (BP Location: Left Arm, Patient Position: Sitting, Cuff Size: Normal)   Pulse (!) 55   Temp 97.8 F (36.6 C) (Oral)   Resp 18   Ht 5\' 8"  (1.727 m)   Wt 237 lb 4 oz (107.6 kg)   LMP 11/20/2014   SpO2 98%   BMI 36.07 kg/m  General:   Well developed, NAD, BMI noted. HEENT:  Normocephalic . Face symmetric, atraumatic MSK: Left hand: Normal Right wrist normal Right hand: See pictures, she is TTP at the base of the third finger, + swelling, redness, minimal warmness.  No openings or cuts.  No fluctuance. Palpation of the knuckles with no apparent effusion.  skin: Not pale. Not jaundice Neurologic:  alert & oriented X3.  Speech normal, gait appropriate for age and unassisted Psych--  Cognition and judgment appear intact.  Cooperative with normal attention span and concentration.  Behavior appropriate. No anxious or depressed appearing.          Assessment  54 year old female, PMH includes hyperlipidemia, obesity, rosacea, vitamin D deficiency BTL, hyperglycemia presents with  Hand cellulitis: DDx of clinical picture includes gout, injury, cellulitis, foreign body, or others. On clinical grounds suspect cellulitis although there is no obvious cuts or triggers. Plan: Doxycycline, x-ray, prompt reevaluation if not improving or if she is getting worse.  See AVS. She clearly verbalized understanding of the plan.   This visit occurred during the SARS-CoV-2 public health emergency.  Safety protocols were in place, including screening questions prior to the visit, additional usage of staff PPE, and extensive cleaning of exam room while observing appropriate contact time as indicated for disinfecting solutions.

## 2020-08-04 NOTE — Progress Notes (Signed)
Pre visit review using our clinic review tool, if applicable. No additional management support is needed unless otherwise documented below in the visit note. 

## 2020-08-04 NOTE — Patient Instructions (Addendum)
Get a x-ray at the first floor  Start antibiotic today, doxycycline for 10 days  Seek medical attention if you are not improving in the next 48 hours.  Seek medical attention if you get much worse, increase redness,   swelling, pain, fever, chills.

## 2020-08-10 ENCOUNTER — Ambulatory Visit (INDEPENDENT_AMBULATORY_CARE_PROVIDER_SITE_OTHER): Payer: No Typology Code available for payment source | Admitting: Family Medicine

## 2020-08-19 ENCOUNTER — Other Ambulatory Visit: Payer: Self-pay

## 2020-08-19 ENCOUNTER — Ambulatory Visit (INDEPENDENT_AMBULATORY_CARE_PROVIDER_SITE_OTHER): Payer: No Typology Code available for payment source

## 2020-08-19 DIAGNOSIS — Z23 Encounter for immunization: Secondary | ICD-10-CM

## 2020-08-19 NOTE — Progress Notes (Signed)
Pt is here today for shingrix vaccine. Pt was given shingrix vaccine in left deltoid. Pt tolerated well

## 2020-08-22 ENCOUNTER — Ambulatory Visit (INDEPENDENT_AMBULATORY_CARE_PROVIDER_SITE_OTHER): Payer: No Typology Code available for payment source | Admitting: Family Medicine

## 2020-08-22 ENCOUNTER — Other Ambulatory Visit (INDEPENDENT_AMBULATORY_CARE_PROVIDER_SITE_OTHER): Payer: Self-pay | Admitting: Family Medicine

## 2020-08-22 ENCOUNTER — Encounter (INDEPENDENT_AMBULATORY_CARE_PROVIDER_SITE_OTHER): Payer: Self-pay | Admitting: Family Medicine

## 2020-08-22 ENCOUNTER — Other Ambulatory Visit: Payer: Self-pay

## 2020-08-22 VITALS — BP 107/68 | HR 60 | Temp 97.9°F | Ht 68.0 in | Wt 229.0 lb

## 2020-08-22 DIAGNOSIS — Z9189 Other specified personal risk factors, not elsewhere classified: Secondary | ICD-10-CM | POA: Diagnosis not present

## 2020-08-22 DIAGNOSIS — F3289 Other specified depressive episodes: Secondary | ICD-10-CM | POA: Diagnosis not present

## 2020-08-22 DIAGNOSIS — E559 Vitamin D deficiency, unspecified: Secondary | ICD-10-CM

## 2020-08-22 DIAGNOSIS — Z6834 Body mass index (BMI) 34.0-34.9, adult: Secondary | ICD-10-CM

## 2020-08-22 DIAGNOSIS — E7849 Other hyperlipidemia: Secondary | ICD-10-CM | POA: Diagnosis not present

## 2020-08-22 DIAGNOSIS — E669 Obesity, unspecified: Secondary | ICD-10-CM

## 2020-08-22 DIAGNOSIS — R7303 Prediabetes: Secondary | ICD-10-CM

## 2020-08-22 DIAGNOSIS — F32A Depression, unspecified: Secondary | ICD-10-CM | POA: Insufficient documentation

## 2020-08-22 MED ORDER — VITAMIN D (ERGOCALCIFEROL) 1.25 MG (50000 UNIT) PO CAPS: 50000.0000 [IU] | ORAL_CAPSULE | ORAL | 0 refills | Status: DC

## 2020-08-22 MED ORDER — BUPROPION HCL 100 MG PO TABS: ORAL_TABLET | ORAL | 0 refills | Status: DC

## 2020-08-22 MED FILL — buPROPion HCL 100 MG TABS: 100 | 30 days supply | Qty: 30 | Fill #0

## 2020-08-22 MED FILL — VIT D2 1.25 MG (50,000 UNIT: 1.25 MG | 28 days supply | Qty: 4 | Fill #0

## 2020-08-22 NOTE — Patient Instructions (Signed)
The 10-year ASCVD risk score Mikey Bussing DC Brooke Bonito., et al., 2013) is: 2.2%   Values used to calculate the score:     Age: 54 years     Sex: Female     Is Non-Hispanic African American: No     Diabetic: No     Tobacco smoker: No     Systolic Blood Pressure: 681 mmHg     Is BP treated: No     HDL Cholesterol: 49 mg/dL     Total Cholesterol: 276 mg/dL

## 2020-08-29 NOTE — Progress Notes (Signed)
Chief Complaint:   OBESITY Alicia Payne is here to discuss her progress with her obesity treatment plan along with follow-up of her obesity related diagnoses. Alicia Payne is on the Category 3 Plan and states she is following her eating plan approximately 80-90% of the time. Alicia Payne states she is walking 20 minutes 4 times per week.  Today's visit was #: 7 Starting weight: 250 lbs Starting date: 05/10/2020 Today's weight: 229 lbs Today's date: 08/22/2020 Total lbs lost to date: 21 lbs Total lbs lost since last in-office visit: 5 lbs Total weight loss percentage to date: -8.40%  Interim History: Alicia Payne's last office visit was 07/27/2020, about 3 weeks ago. She stated it is a lot of food to eat. She breaks up her dinner proteins.  Assessment/Plan:   1. Vitamin D deficiency Alicia Payne's Vitamin D level was 32.9 on 05/10/2020. She is currently taking prescription vitamin D 50,000 IU each week. She denies nausea, vomiting or muscle weakness.  Plan: Refill Vit D for 1 month. Check labs at next office visit. Low Vitamin D level contributes to fatigue and are associated with obesity, breast, and colon cancer. She agrees to continue to take prescription Vitamin D @50 ,000 IU every week and will follow-up for routine testing of Vitamin D, at least 2-3 times per year to avoid over-replacement.  Refill- Vitamin D, Ergocalciferol, (DRISDOL) 1.25 MG (50000 UNIT) CAPS capsule; Take 1 capsule (50,000 Units total) by mouth every 7 (seven) days.  Dispense: 4 capsule; Refill: 0  - VITAMIN D 25 Hydroxy (Vit-D Deficiency, Fractures)  2. Pre-diabetes - I reiterated and again counseled patient on pathophysiology of the disease process of I.R. and/or Pre-DM.    - Stressed importance of dietary and lifestyle modifications resulting in weight loss as first line txmnt - continue to decrease simple carbs; increase fiber and proteins -> follow meal plan  - handouts provided at pt's request after education provided.   All concerns/questions addressed.   - anticipatory guidance given.   - Recheck A1c and fasting insulin level in approximately 3 months from last check or as deemed fit.   Plan: Check A1c and fasting insulin.  - Hemoglobin A1c - Insulin, random  3. Other hyperlipidemia with hypertriglyceridemia Alicia Payne has hyperlipidemia and has been trying to improve her cholesterol levels with intensive lifestyle modification including a low saturated fat diet, exercise and weight loss. She denies any chest pain, claudication or myalgias.  Lab Results  Component Value Date   ALT 22 05/10/2020   AST 16 05/10/2020   ALKPHOS 76 05/10/2020   BILITOT 0.5 05/10/2020   Lab Results  Component Value Date   CHOL 276 (H) 05/10/2020   HDL 49 05/10/2020   LDLCALC 192 (H) 05/10/2020   TRIG 184 (H) 05/10/2020   CHOLHDL 5.6 (H) 05/10/2020   Plan: Check fasting lipid panel at next office visit.  - Lipid panel  4. Other depression with emotional eating It has been 9-10 weeks since Alicia Payne started taking Wellbutrin. She is doing well on it. She has less desire for sweets now. Less emotional eating than before, and she sleeps better than before. She has no RLS symptoms at night anymore.  Plan: Refill Wellbutrin for 1 month. Emotional eating strategies discussed with patient.  Refill- buPROPion (WELLBUTRIN) 100 MG tablet; 1 po qd  Dispense: 30 tablet; Refill: 0  5. At risk for impaired metabolic function Alicia Payne was given approximately 15 minutes of impaired  metabolic function prevention counseling today. We discussed intensive lifestyle modifications today with  an emphasis on specific nutrition and exercise instructions and strategies.   6. Class 1 obesity with serious comorbidity and body mass index (BMI) of 34.0 to 34.9 in adult, unspecified obesity type Alicia Payne is currently in the action stage of change. As such, her goal is to continue with weight loss efforts. She has agreed to the Category 3 Plan.     Exercise goals: Increase to 30 minutes 5 days a week  Behavioral modification strategies: increasing lean protein intake, decreasing simple carbohydrates, meal planning and cooking strategies, keeping healthy foods in the home and holiday eating strategies .  Alicia Payne has agreed to follow-up with our clinic in 3 weeks. She will come fasting for bloodwork (A1c, FI, Vit D, FLP) She was informed of the importance of frequent follow-up visits to maximize her success with intensive lifestyle modifications for her multiple health conditions.    Objective:   Blood pressure 107/68, pulse 60, temperature 97.9 F (36.6 C), height 5\' 8"  (1.727 m), weight 229 lb (103.9 kg), last menstrual period 11/20/2014, SpO2 98 %. Body mass index is 34.82 kg/m.  General: Cooperative, alert, well developed, in no acute distress. HEENT: Conjunctivae and lids unremarkable. Cardiovascular: Regular rhythm.  Lungs: Normal work of breathing. Neurologic: No focal deficits.   Lab Results  Component Value Date   CREATININE 0.74 05/10/2020   BUN 15 05/10/2020   NA 140 05/10/2020   K 5.1 05/10/2020   CL 103 05/10/2020   CO2 24 05/10/2020   Lab Results  Component Value Date   ALT 22 05/10/2020   AST 16 05/10/2020   ALKPHOS 76 05/10/2020   BILITOT 0.5 05/10/2020   Lab Results  Component Value Date   HGBA1C 6.0 (H) 05/10/2020   Lab Results  Component Value Date   INSULIN 16.9 05/10/2020   Lab Results  Component Value Date   TSH 1.220 05/10/2020   Lab Results  Component Value Date   CHOL 276 (H) 05/10/2020   HDL 49 05/10/2020   LDLCALC 192 (H) 05/10/2020   TRIG 184 (H) 05/10/2020   CHOLHDL 5.6 (H) 05/10/2020   Lab Results  Component Value Date   WBC 6.5 05/10/2020   HGB 16.3 (H) 05/10/2020   HCT 48.5 (H) 05/10/2020   MCV 91 05/10/2020   PLT 245 05/10/2020    Attestation Statements:   Reviewed by clinician on day of visit: allergies, medications, problem list, medical history, surgical  history, family history, social history, and previous encounter notes.  Coral Ceo, am acting as Location manager for [employer], DO.  I have reviewed the above documentation for accuracy and completeness, and I agree with the above. Marjory Sneddon, D.O.  The [place] was signed into law in 2016 which includes the topic of electronic health records.  This provides immediate access to information in MyChart.  This includes consultation notes, operative notes, office notes, lab results and pathology reports.  If you have any questions about what you read please let us know at your next visit so we can discuss your concerns and take corrective action if need be.  We are right here with you.

## 2020-09-02 ENCOUNTER — Ambulatory Visit: Payer: No Typology Code available for payment source

## 2020-09-14 ENCOUNTER — Ambulatory Visit (INDEPENDENT_AMBULATORY_CARE_PROVIDER_SITE_OTHER): Payer: No Typology Code available for payment source | Admitting: Family Medicine

## 2020-09-20 ENCOUNTER — Other Ambulatory Visit (INDEPENDENT_AMBULATORY_CARE_PROVIDER_SITE_OTHER): Payer: Self-pay | Admitting: Family Medicine

## 2020-09-20 DIAGNOSIS — F3289 Other specified depressive episodes: Secondary | ICD-10-CM

## 2020-09-20 DIAGNOSIS — E559 Vitamin D deficiency, unspecified: Secondary | ICD-10-CM

## 2020-09-20 NOTE — Telephone Encounter (Signed)
This patient was last seen by Dr. Raliegh Scarlet, and currently has an upcoming appt scheduled on 10/04/20 with Mina Marble, FNP

## 2020-09-20 NOTE — Telephone Encounter (Signed)
Last vitamin d level:  05/10/20, 32.9 Last 2 b/p readings:  08/22/20 (107/68),  07/27/20 (121/80)  Please refill

## 2020-09-22 ENCOUNTER — Other Ambulatory Visit (INDEPENDENT_AMBULATORY_CARE_PROVIDER_SITE_OTHER): Payer: Self-pay | Admitting: Family Medicine

## 2020-09-22 ENCOUNTER — Other Ambulatory Visit (INDEPENDENT_AMBULATORY_CARE_PROVIDER_SITE_OTHER): Payer: Self-pay

## 2020-09-22 DIAGNOSIS — F3289 Other specified depressive episodes: Secondary | ICD-10-CM

## 2020-09-22 MED ORDER — BUPROPION HCL 100 MG PO TABS: ORAL_TABLET | ORAL | 0 refills | Status: DC

## 2020-09-22 MED FILL — buPROPion HCL 100 MG TABS: 100 | 30 days supply | Qty: 30 | Fill #0

## 2020-10-03 NOTE — Telephone Encounter (Signed)
Was this one addressed? I believe so but just wanted to make sure?

## 2020-10-04 ENCOUNTER — Other Ambulatory Visit: Payer: Self-pay

## 2020-10-04 ENCOUNTER — Encounter (INDEPENDENT_AMBULATORY_CARE_PROVIDER_SITE_OTHER): Payer: Self-pay | Admitting: Adult Health

## 2020-10-04 ENCOUNTER — Ambulatory Visit (INDEPENDENT_AMBULATORY_CARE_PROVIDER_SITE_OTHER): Payer: No Typology Code available for payment source | Admitting: Adult Health

## 2020-10-04 ENCOUNTER — Other Ambulatory Visit (INDEPENDENT_AMBULATORY_CARE_PROVIDER_SITE_OTHER): Payer: Self-pay | Admitting: Adult Health

## 2020-10-04 VITALS — BP 133/85 | HR 65 | Temp 97.7°F | Ht 68.0 in | Wt 225.0 lb

## 2020-10-04 DIAGNOSIS — Z6834 Body mass index (BMI) 34.0-34.9, adult: Secondary | ICD-10-CM

## 2020-10-04 DIAGNOSIS — Z9189 Other specified personal risk factors, not elsewhere classified: Secondary | ICD-10-CM | POA: Diagnosis not present

## 2020-10-04 DIAGNOSIS — E559 Vitamin D deficiency, unspecified: Secondary | ICD-10-CM

## 2020-10-04 DIAGNOSIS — E7849 Other hyperlipidemia: Secondary | ICD-10-CM

## 2020-10-04 DIAGNOSIS — F3289 Other specified depressive episodes: Secondary | ICD-10-CM | POA: Diagnosis not present

## 2020-10-04 DIAGNOSIS — E669 Obesity, unspecified: Secondary | ICD-10-CM

## 2020-10-04 DIAGNOSIS — R7303 Prediabetes: Secondary | ICD-10-CM

## 2020-10-04 MED ORDER — VITAMIN D (ERGOCALCIFEROL) 1.25 MG (50000 UNIT) PO CAPS
50000.0000 [IU] | ORAL_CAPSULE | ORAL | 0 refills | Status: DC
Start: 2020-10-04 — End: 2020-10-19

## 2020-10-04 MED FILL — VIT D2 1.25 MG (50,000 UNIT: 1.25 MG | 28 days supply | Qty: 4 | Fill #0

## 2020-10-04 NOTE — Progress Notes (Signed)
Chief Complaint:   OBESITY Alicia Payne is here to discuss her progress with her obesity treatment plan along with follow-up of her obesity related diagnoses. Alicia Payne is on the Category 3 Plan and states she is following her eating plan approximately 80% of the time. Alicia Payne states she is walking 20 minutes 4-5 times per week.  Today's visit was #: 8 Starting weight: 250 lbs Starting date: 05/10/2020 Today's weight: 225 lbs Today's date: 10/04/2020 Total lbs lost to date: 25 lbs Total lbs lost since last in-office visit: 4 lbs  Interim History: Alicia Payne is quite pleased with 4 lb weight loss over the holidays. She has lost a total of 25 lbs, and her BMI is currently 34.3.  She has enjoyed her regular waling regime.  Subjective:   1. Vitamin D deficiency Alicia Payne's Vitamin D level was 32.9 on 05/10/2020. She is currently taking prescription vitamin D 50,000 IU each week. She denies nausea, vomiting or muscle weakness.  2. Pre-diabetes Alicia Payne's blood glucose, A1c, and insulin levels were all above goal on 05/10/2020. She has a diagnosis of prediabetes based on her elevated HgA1c and was informed this puts her at greater risk of developing diabetes. She continues to work on diet and exercise to decrease her risk of diabetes. She denies nausea or hypoglycemia. She is not on Metformin.  Lab Results  Component Value Date   HGBA1C 6.0 (H) 05/10/2020   Lab Results  Component Value Date   INSULIN 16.9 05/10/2020    3. Other depression with emotional eating Alicia Payne's blood pressure and heart rate are excellent in office visit. She is prescribed Wellbutrin 100 mg. She feels that her cravings are well controlled and her mood is stable.  She denies SI/HI.  4. Other hyperlipidemia Alicia Payne has hyperlipidemia but is not on a statin. She has been trying to improve her cholesterol levels with intensive lifestyle modification including a low saturated fat diet, exercise and weight loss. She  denies any chest pain, claudication or myalgias.  Lab Results  Component Value Date   ALT 22 05/10/2020   AST 16 05/10/2020   ALKPHOS 76 05/10/2020   BILITOT 0.5 05/10/2020   Lab Results  Component Value Date   CHOL 276 (H) 05/10/2020   HDL 49 05/10/2020   LDLCALC 192 (H) 05/10/2020   TRIG 184 (H) 05/10/2020   CHOLHDL 5.6 (H) 05/10/2020    5. At risk for diabetes mellitus Alicia Payne is at higher than average risk for developing diabetes due to obesity.    Assessment/Plan:   1. Vitamin D deficiency Low Vitamin D level contributes to fatigue and are associated with obesity, breast, and colon cancer. She agrees to continue to take prescription Vitamin D @50 ,000 IU every week and will follow-up for routine testing of Vitamin D, at least 2-3 times per year to avoid over-replacement. Refill Vit D for 1 month, as per below. Check labs today.  - Vitamin D, Ergocalciferol, (DRISDOL) 1.25 MG (50000 UNIT) CAPS capsule; Take 1 capsule (50,000 Units total) by mouth every 7 (seven) days.  Dispense: 4 capsule; Refill: 0  - VITAMIN D 25 Hydroxy (Vit-D Deficiency, Fractures)  2. Pre-diabetes Alicia Payne will continue to work on weight loss, exercise, and decreasing simple carbohydrates to help decrease the risk of diabetes. Check labs today.   - Hemoglobin A1c - Insulin, random  3. Other depression with emotional eating Behavior modification techniques were discussed today to help Alicia Payne deal with her emotional/non-hunger eating behaviors.  Orders and follow up as documented  in patient record.   4. Other hyperlipidemia Cardiovascular risk and specific lipid/LDL goals reviewed.  We discussed several lifestyle modifications today and Alicia Payne will continue to work on diet, exercise and weight loss efforts. Orders and follow up as documented in patient record.   Counseling Intensive lifestyle modifications are the first line treatment for this issue. . Dietary changes: Increase soluble fiber.  Decrease simple carbohydrates. . Exercise changes: Moderate to vigorous-intensity aerobic activity 150 minutes per week if tolerated. . Lipid-lowering medications: see documented in medical record. Check labs today.  - Comprehensive metabolic panel - Lipid panel  5. At risk for diabetes mellitus Alicia Payne was given approximately 15 minutes of diabetes education and counseling today. We discussed intensive lifestyle modifications today with an emphasis on weight loss as well as increasing exercise and decreasing simple carbohydrates in her diet. We also reviewed medication options with an emphasis on risk versus benefit of those discussed.   Repetitive spaced learning was employed today to elicit superior memory formation and behavioral change.  6. Class 1 obesity with serious comorbidity and body mass index (BMI) of 34.0 to 34.9 in adult, unspecified obesity type Alicia Payne is currently in the action stage of change. As such, her goal is to continue with weight loss efforts. She has agreed to the Category 3 Plan.   Handout provided: Additional breakfast options.  Exercise goals: As is  Behavioral modification strategies: increasing lean protein intake, increasing water intake, meal planning and cooking strategies and planning for success.  Alicia Payne has agreed to follow-up with our clinic in 3 weeks. She was informed of the importance of frequent follow-up visits to maximize her success with intensive lifestyle modifications for her multiple health conditions.   Alicia Payne was informed we would discuss her lab results at her next visit unless there is a critical issue that needs to be addressed sooner. Alicia Payne agreed to keep her next visit at the agreed upon time to discuss these results.  Objective:   Blood pressure 133/85, pulse 65, temperature 97.7 F (36.5 C), temperature source Oral, height 5\' 8"  (1.727 m), weight 225 lb (102.1 kg), last menstrual period 11/20/2014, SpO2 97 %. Body mass  index is 34.21 kg/m.  General: Cooperative, alert, well developed, in no acute distress. HEENT: Conjunctivae and lids unremarkable. Cardiovascular: Regular rhythm.  Lungs: Normal work of breathing. Neurologic: No focal deficits.   Lab Results  Component Value Date   CREATININE 0.74 05/10/2020   BUN 15 05/10/2020   NA 140 05/10/2020   K 5.1 05/10/2020   CL 103 05/10/2020   CO2 24 05/10/2020   Lab Results  Component Value Date   ALT 22 05/10/2020   AST 16 05/10/2020   ALKPHOS 76 05/10/2020   BILITOT 0.5 05/10/2020   Lab Results  Component Value Date   HGBA1C 6.0 (H) 05/10/2020   Lab Results  Component Value Date   INSULIN 16.9 05/10/2020   Lab Results  Component Value Date   TSH 1.220 05/10/2020   Lab Results  Component Value Date   CHOL 276 (H) 05/10/2020   HDL 49 05/10/2020   LDLCALC 192 (H) 05/10/2020   TRIG 184 (H) 05/10/2020   CHOLHDL 5.6 (H) 05/10/2020   Lab Results  Component Value Date   WBC 6.5 05/10/2020   HGB 16.3 (H) 05/10/2020   HCT 48.5 (H) 05/10/2020   MCV 91 05/10/2020   PLT 245 05/10/2020     Attestation Statements:   Reviewed by clinician on day of visit: allergies, medications, problem  list, medical history, surgical history, family history, social history, and previous encounter notes.   Coral Ceo, am acting as Location manager for Mina Marble, NP.   I have reviewed the above documentation for accuracy and completeness, and I agree with the above. -  Lynzy Rawles d. Jeannene Tschetter, NP-C

## 2020-10-05 LAB — COMPREHENSIVE METABOLIC PANEL
ALT: 22 IU/L (ref 0–32)
AST: 14 IU/L (ref 0–40)
Albumin/Globulin Ratio: 2 (ref 1.2–2.2)
Albumin: 4.5 g/dL (ref 3.8–4.9)
Alkaline Phosphatase: 73 IU/L (ref 44–121)
BUN/Creatinine Ratio: 21 (ref 9–23)
BUN: 17 mg/dL (ref 6–24)
Bilirubin Total: 0.5 mg/dL (ref 0.0–1.2)
CO2: 22 mmol/L (ref 20–29)
Calcium: 9.6 mg/dL (ref 8.7–10.2)
Chloride: 106 mmol/L (ref 96–106)
Creatinine, Ser: 0.81 mg/dL (ref 0.57–1.00)
GFR calc Af Amer: 95 mL/min/{1.73_m2} (ref >59)
GFR calc non Af Amer: 82 mL/min/{1.73_m2} (ref >59)
Globulin, Total: 2.3 g/dL (ref 1.5–4.5)
Glucose: 91 mg/dL (ref 65–99)
Potassium: 4.7 mmol/L (ref 3.5–5.2)
Sodium: 143 mmol/L (ref 134–144)
Total Protein: 6.8 g/dL (ref 6.0–8.5)

## 2020-10-05 LAB — LIPID PANEL
Chol/HDL Ratio: 4.9 ratio — ABNORMAL HIGH (ref 0.0–4.4)
Cholesterol, Total: 248 mg/dL — ABNORMAL HIGH (ref 100–199)
HDL: 51 mg/dL (ref >39)
LDL Chol Calc (NIH): 175 mg/dL — ABNORMAL HIGH (ref 0–99)
Triglycerides: 121 mg/dL (ref 0–149)
VLDL Cholesterol Cal: 22 mg/dL (ref 5–40)

## 2020-10-05 LAB — HEMOGLOBIN A1C
Est. average glucose Bld gHb Est-mCnc: 123 mg/dL
Hgb A1c MFr Bld: 5.9 % — ABNORMAL HIGH (ref 4.8–5.6)

## 2020-10-05 LAB — INSULIN, RANDOM: INSULIN: 9.1 u[IU]/mL (ref 2.6–24.9)

## 2020-10-05 LAB — VITAMIN D 25 HYDROXY (VIT D DEFICIENCY, FRACTURES): Vit D, 25-Hydroxy: 45.8 ng/mL (ref 30.0–100.0)

## 2020-10-10 ENCOUNTER — Ambulatory Visit: Payer: No Typology Code available for payment source | Admitting: Physician Assistant

## 2020-10-13 ENCOUNTER — Ambulatory Visit
Admission: RE | Admit: 2020-10-13 | Discharge: 2020-10-13 | Disposition: A | Discharge status: Home / Self Care | Payer: No Typology Code available for payment source | Source: Ambulatory Visit | Attending: Family | Admitting: Family

## 2020-10-13 ENCOUNTER — Other Ambulatory Visit: Payer: Self-pay

## 2020-10-13 DIAGNOSIS — Z0000 Encounter for general adult medical examination without abnormal findings: Secondary | ICD-10-CM

## 2020-10-14 ENCOUNTER — Telehealth: Payer: Self-pay | Admitting: Family

## 2020-10-14 NOTE — Telephone Encounter (Signed)
Patient advised she will receive a call from radiology

## 2020-10-14 NOTE — Telephone Encounter (Signed)
Please let pt know that the radiologist would like her to complete some additional breast images for further evaluation. Let me know if she has not been contacted by them about a follow up appointment in 1 week.   

## 2020-10-18 ENCOUNTER — Other Ambulatory Visit: Payer: Self-pay | Admitting: Family

## 2020-10-18 ENCOUNTER — Encounter (INDEPENDENT_AMBULATORY_CARE_PROVIDER_SITE_OTHER): Payer: Self-pay

## 2020-10-18 DIAGNOSIS — R928 Other abnormal and inconclusive findings on diagnostic imaging of breast: Secondary | ICD-10-CM

## 2020-10-19 ENCOUNTER — Other Ambulatory Visit: Payer: Self-pay

## 2020-10-19 ENCOUNTER — Telehealth (INDEPENDENT_AMBULATORY_CARE_PROVIDER_SITE_OTHER): Payer: No Typology Code available for payment source | Admitting: Family Medicine

## 2020-10-19 ENCOUNTER — Other Ambulatory Visit (INDEPENDENT_AMBULATORY_CARE_PROVIDER_SITE_OTHER): Payer: Self-pay | Admitting: Family Medicine

## 2020-10-19 ENCOUNTER — Telehealth (INDEPENDENT_AMBULATORY_CARE_PROVIDER_SITE_OTHER): Payer: Self-pay

## 2020-10-19 ENCOUNTER — Encounter (INDEPENDENT_AMBULATORY_CARE_PROVIDER_SITE_OTHER): Payer: Self-pay | Admitting: Family Medicine

## 2020-10-19 VITALS — Wt 226.0 lb

## 2020-10-19 DIAGNOSIS — E669 Obesity, unspecified: Secondary | ICD-10-CM

## 2020-10-19 DIAGNOSIS — E559 Vitamin D deficiency, unspecified: Secondary | ICD-10-CM

## 2020-10-19 DIAGNOSIS — F430 Acute stress reaction: Secondary | ICD-10-CM | POA: Diagnosis not present

## 2020-10-19 DIAGNOSIS — Z6834 Body mass index (BMI) 34.0-34.9, adult: Secondary | ICD-10-CM | POA: Diagnosis not present

## 2020-10-19 DIAGNOSIS — Z9189 Other specified personal risk factors, not elsewhere classified: Secondary | ICD-10-CM

## 2020-10-19 MED ORDER — BUPROPION HCL ER (XL) 150 MG PO TB24: 150.0000 mg | ORAL_TABLET | ORAL | 0 refills | Status: DC

## 2020-10-19 MED ORDER — VITAMIN D (ERGOCALCIFEROL) 1.25 MG (50000 UNIT) PO CAPS
50000.0000 [IU] | ORAL_CAPSULE | ORAL | 0 refills | Status: DC
Start: 2020-10-19 — End: 2020-10-19

## 2020-10-19 MED FILL — buPROPion HCL ER (XL) 150 M: 150 | 30 days supply | Qty: 30 | Fill #0

## 2020-10-19 NOTE — Telephone Encounter (Signed)
Verbal consent obtained to conduct video visit, via telehealth 

## 2020-10-19 NOTE — Patient Instructions (Signed)
The 10-year ASCVD risk score Mikey Bussing DC Brooke Bonito., et al., 2013) is: 2.9%   Values used to calculate the score:     Age: 55 years     Sex: Female     Is Non-Hispanic African American: No     Diabetic: No     Tobacco smoker: No     Systolic Blood Pressure: 010 mmHg     Is BP treated: No     HDL Cholesterol: 51 mg/dL     Total Cholesterol: 248 mg/dL

## 2020-10-22 NOTE — Progress Notes (Signed)
TeleHealth Visit:  Due to the COVID-19 pandemic, this visit was completed with telemedicine (audio/video) technology to reduce patient and provider exposure as well as to preserve personal protective equipment.   Alan has verbally consented to this TeleHealth visit. The patient is located at home, the provider is located at the [facility] office. The participants in this visit include the listed provider and patient. The visit was conducted today via video.   Chief Complaint: OBESITY Alicia Payne is here to discuss her progress with her obesity treatment plan along with follow-up of her obesity related diagnoses. Alicia Payne is on the Category 3 Plan and states she is following her eating plan approximately 80% of the time. Alicia Payne states she is walking 15-20 minutes 4-5 times per week.  Today's visit was #: 9 Starting weight: 250 lbs Starting date: 05/10/2020  Interim History: Pt's last OV was with [facility] on 10/04/2020. No change in meds or treatment plan. No issues with plan but a lot of stress eating lately vs prior visits. Pt is exercising 4-5 days a week usually, but hasn't been as of late due to stress.  Assessment/Plan:   1. Acute reaction to stress- emotional eating Pt recently found out her husband's stage 4 bladder cancer has metastasized to his brain. Pt is struggling with emotional eating and she's turning to chocolate at times to "he;p me feel happy even for a few minutes."  Plan:  1. D/c Wellbutrin 100 mg immediate release 12  Hour. Start Wellbutrin XL 150 mg, as per below. 2. Call [place] regarding setting up counseling session for emotional support. Also EAP as an option and [place]. 3. Call FMLA and fill out papers 4. Stress management techniques discussed with patient and emotional eating strategies  5. Exercise 10-30 minutes everyday for stress management 6. Sleep hygiene discussed with pt- no current issues 7. Goal is to maintain weight and focus on self-care  habits that help maintain vs losing weight during this stressful time.  Start- buPROPion (WELLBUTRIN XL) 150 MG 24 hr tablet; Take 1 tablet (150 mg total) by mouth every morning.  Dispense: 30 tablet; Refill: 0  2. Vitamin D deficiency Alicia Payne's Vitamin D level was 45.8 on 10/04/2020, which is up from 32 prior. She is currently taking prescription vitamin D 50,000 IU each week. She denies nausea, vomiting or muscle weakness.  Ref. Range 10/04/2020 07:50  Vitamin D, 25-Hydroxy Latest Ref Range: 30.0 - 100.0 ng/mL 45.8   Plan: Refill Vit D for 1 month, as per below. Low Vitamin D level contributes to fatigue and are associated with obesity, breast, and colon cancer. She agrees to continue to take prescription Vitamin D @50 ,000 IU every week and will follow-up for routine testing of Vitamin D, at least 2-3 times per year to avoid over-replacement.  Refill- Vitamin D, Ergocalciferol, (DRISDOL) 1.25 MG (50000 UNIT) CAPS capsule; Take 1 capsule (50,000 Units total) by mouth every 7 (seven) days.  Dispense: 4 capsule; Refill: 0  3. At risk for depression Alicia Payne was given approximately 9 minutes of depression risk counseling today. She has risk factors for depression. We discussed the importance of a healthy work life balance, a healthy relationship with food and a good support system.  4. Class 1 obesity with serious comorbidity and body mass index (BMI) of 34.0 to 34.9 in adult, unspecified obesity type Alicia Payne is currently in the action stage of change. As such, her goal is to continue with weight loss efforts. She has agreed to the  Category 3 Plan.   Exercise goals: As is  Behavioral modification strategies: keeping healthy foods in the home, emotional eating strategies and planning for success.  Alicia Payne has agreed to follow-up with our clinic in 2 weeks on 11/07/2020. She was informed of the importance of frequent follow-up visits to maximize her success with intensive lifestyle modifications for  her multiple health conditions.  Objective:   VITALS: Per patient if applicable, see vitals. GENERAL: Alert and in no acute distress. CARDIOPULMONARY: No increased WOB. Speaking in clear sentences.  PSYCH: Pleasant and cooperative. Speech normal rate and rhythm. Affect is appropriate. Insight and judgement are appropriate. Attention is focused, linear, and appropriate.  NEURO: Oriented as arrived to appointment on time with no prompting.   Lab Results  Component Value Date   CREATININE 0.81 10/04/2020   BUN 17 10/04/2020   NA 143 10/04/2020   K 4.7 10/04/2020   CL 106 10/04/2020   CO2 22 10/04/2020   Lab Results  Component Value Date   ALT 22 10/04/2020   AST 14 10/04/2020   ALKPHOS 73 10/04/2020   BILITOT 0.5 10/04/2020   Lab Results  Component Value Date   HGBA1C 5.9 (H) 10/04/2020   HGBA1C 6.0 (H) 05/10/2020   Lab Results  Component Value Date   INSULIN 9.1 10/04/2020   INSULIN 16.9 05/10/2020   Lab Results  Component Value Date   TSH 1.220 05/10/2020   Lab Results  Component Value Date   CHOL 248 (H) 10/04/2020   HDL 51 10/04/2020   LDLCALC 175 (H) 10/04/2020   TRIG 121 10/04/2020   CHOLHDL 4.9 (H) 10/04/2020   Lab Results  Component Value Date   WBC 6.5 05/10/2020   HGB 16.3 (H) 05/10/2020   HCT 48.5 (H) 05/10/2020   MCV 91 05/10/2020   PLT 245 05/10/2020   No results found for: IRON, TIBC, FERRITIN  Attestation Statements:   Reviewed by clinician on day of visit: allergies, medications, problem list, medical history, surgical history, family history, social history, and previous encounter notes.  Coral Ceo, am acting as Location manager for [employer], DO.  I have reviewed the above documentation for accuracy and completeness, and I agree with the above. Marjory Sneddon, D.O.  The [place] was signed into law in 2016 which includes the topic of electronic health records.  This provides immediate access to  information in MyChart.  This includes consultation notes, operative notes, office notes, lab results and pathology reports.  If you have any questions about what you read please let us know at your next visit so we can discuss your concerns and take corrective action if need be.  We are right here with you.

## 2020-11-01 ENCOUNTER — Other Ambulatory Visit: Payer: No Typology Code available for payment source

## 2020-11-07 ENCOUNTER — Ambulatory Visit (INDEPENDENT_AMBULATORY_CARE_PROVIDER_SITE_OTHER): Payer: No Typology Code available for payment source | Admitting: Family Medicine

## 2020-11-07 ENCOUNTER — Telehealth (INDEPENDENT_AMBULATORY_CARE_PROVIDER_SITE_OTHER): Payer: Self-pay

## 2020-11-14 ENCOUNTER — Ambulatory Visit: Payer: No Typology Code available for payment source

## 2020-11-14 ENCOUNTER — Encounter (INDEPENDENT_AMBULATORY_CARE_PROVIDER_SITE_OTHER): Payer: Self-pay | Admitting: Family Medicine

## 2020-11-14 ENCOUNTER — Ambulatory Visit
Admission: RE | Admit: 2020-11-14 | Discharge: 2020-11-14 | Disposition: A | Discharge status: Home / Self Care | Payer: No Typology Code available for payment source | Source: Ambulatory Visit | Attending: Family | Admitting: Family

## 2020-11-14 ENCOUNTER — Other Ambulatory Visit: Payer: Self-pay

## 2020-11-14 DIAGNOSIS — R928 Other abnormal and inconclusive findings on diagnostic imaging of breast: Secondary | ICD-10-CM

## 2020-12-12 ENCOUNTER — Encounter: Payer: Self-pay | Admitting: Gastroenterology

## 2021-01-26 ENCOUNTER — Other Ambulatory Visit: Payer: Self-pay

## 2021-01-26 ENCOUNTER — Other Ambulatory Visit (HOSPITAL_COMMUNITY): Payer: Self-pay

## 2021-01-26 ENCOUNTER — Ambulatory Visit (AMBULATORY_SURGERY_CENTER): Payer: No Typology Code available for payment source | Admitting: *Deleted

## 2021-01-26 VITALS — Ht 68.0 in | Wt 225.0 lb

## 2021-01-26 DIAGNOSIS — Z1211 Encounter for screening for malignant neoplasm of colon: Secondary | ICD-10-CM

## 2021-01-26 MED ORDER — SUPREP BOWEL PREP KIT 17.5-3.13-1.6 GM/177ML PO SOLN
1.0000 | Freq: Once | ORAL | 0 refills | Status: AC
  Filled 2021-01-26: qty 354, 1d supply, fill #0

## 2021-01-26 NOTE — Progress Notes (Addendum)
No egg or soy allergy known to patient  No issues with past sedation with any surgeries or procedures Patient denies ever being told they had issues or difficulty with intubation  No FH of Malignant Hyperthermia No diet pills per patient No home 02 use per patient  No blood thinners per patient  Pt denies issues with constipation  No A fib or A flutter  EMMI video to pt or via [place] 19 guidelines implemented in [place] today with Pt and RN  Pt is fully vaccinated  for [employer] previsit completed. Instructions mailed and sent through [facility].   Due to the COVID-19 pandemic we are asking patients to follow certain guidelines.  Pt aware of COVID protocols and [facility] guidelines

## 2021-01-31 ENCOUNTER — Other Ambulatory Visit (HOSPITAL_COMMUNITY): Payer: Self-pay

## 2021-02-03 ENCOUNTER — Other Ambulatory Visit: Payer: Self-pay | Admitting: Gastroenterology

## 2021-02-03 ENCOUNTER — Encounter: Payer: Self-pay | Admitting: Gastroenterology

## 2021-02-03 ENCOUNTER — Other Ambulatory Visit: Payer: Self-pay

## 2021-02-03 ENCOUNTER — Ambulatory Visit (AMBULATORY_SURGERY_CENTER): Payer: No Typology Code available for payment source | Admitting: Gastroenterology

## 2021-02-03 VITALS — BP 133/67 | HR 55 | Temp 97.1°F | Resp 12 | Ht 68.0 in | Wt 225.0 lb

## 2021-02-03 DIAGNOSIS — D122 Benign neoplasm of ascending colon: Secondary | ICD-10-CM

## 2021-02-03 DIAGNOSIS — D125 Benign neoplasm of sigmoid colon: Secondary | ICD-10-CM | POA: Diagnosis not present

## 2021-02-03 DIAGNOSIS — Z1211 Encounter for screening for malignant neoplasm of colon: Secondary | ICD-10-CM | POA: Diagnosis not present

## 2021-02-03 DIAGNOSIS — D123 Benign neoplasm of transverse colon: Secondary | ICD-10-CM | POA: Diagnosis not present

## 2021-02-03 DIAGNOSIS — D120 Benign neoplasm of cecum: Secondary | ICD-10-CM

## 2021-02-03 DIAGNOSIS — D128 Benign neoplasm of rectum: Secondary | ICD-10-CM | POA: Diagnosis not present

## 2021-02-03 DIAGNOSIS — D126 Benign neoplasm of colon, unspecified: Secondary | ICD-10-CM | POA: Diagnosis not present

## 2021-02-03 MED ORDER — SODIUM CHLORIDE 0.9 % IV SOLN: 500.0000 mL | Freq: Once | INTRAVENOUS | Status: DC

## 2021-02-03 NOTE — Patient Instructions (Signed)
Handout provided on polyps.   YOU HAD AN ENDOSCOPIC PROCEDURE TODAY AT THE [facility]:   Refer to the procedure report that was given to you for any specific questions about what was found during the examination.  If the procedure report does not answer your questions, please call your gastroenterologist to clarify.  If you requested that your care partner not be given the details of your procedure findings, then the procedure report has been included in a sealed envelope for you to review at your convenience later.  YOU SHOULD EXPECT: Some feelings of bloating in the abdomen. Passage of more gas than usual.  Walking can help get rid of the air that was put into your GI tract during the procedure and reduce the bloating. If you had a lower endoscopy (such as a colonoscopy or flexible sigmoidoscopy) you may notice spotting of blood in your stool or on the toilet paper. If you underwent a bowel prep for your procedure, you may not have a normal bowel movement for a few days.  Please Note:  You might notice some irritation and congestion in your nose or some drainage.  This is from the oxygen used during your procedure.  There is no need for concern and it should clear up in a day or so.  SYMPTOMS TO REPORT IMMEDIATELY:  Following lower endoscopy (colonoscopy or flexible sigmoidoscopy):  Excessive amounts of blood in the stool  Significant tenderness or worsening of abdominal pains  Swelling of the abdomen that is new, acute  Fever of 100F or higher  For urgent or emergent issues, a gastroenterologist can be reached at any hour by calling [phone]. Do not use MyChart messaging for urgent concerns.    DIET:  We do recommend a small meal at first, but then you may proceed to your regular diet.  Drink plenty of fluids but you should avoid alcoholic beverages for 24 hours.  ACTIVITY:  You should plan to take it easy for the rest of today and you should NOT DRIVE or use heavy  machinery until tomorrow (because of the sedation medicines used during the test).    FOLLOW UP: Our staff will call the number listed on your records 48-72 hours following your procedure to check on you and address any questions or concerns that you may have regarding the information given to you following your procedure. If we do not reach you, we will leave a message.  We will attempt to reach you two times.  During this call, we will ask if you have developed any symptoms of COVID 19. If you develop any symptoms (ie: fever, flu-like symptoms, shortness of breath, cough etc.) before then, please call [phone].  If you test positive for Covid 19 in the 2 weeks post procedure, please call and report this information to us.    If any biopsies were taken you will be contacted by phone or by letter within the next 1-3 weeks.  Please call us at [phone] if you have not heard about the biopsies in 3 weeks.    SIGNATURES/CONFIDENTIALITY: You and/or your care partner have signed paperwork which will be entered into your electronic medical record.  These signatures attest to the fact that that the information above on your After Visit Summary has been reviewed and is understood.  Full responsibility of the confidentiality of this discharge information lies with you and/or your care-partner.  

## 2021-02-03 NOTE — Progress Notes (Signed)
Report to PACU, RN, vss, BBS= Clear.  

## 2021-02-03 NOTE — Progress Notes (Signed)
Called to room to assist during endoscopic procedure.  Patient ID and intended procedure confirmed with present staff. Received instructions for my participation in the procedure from the performing physician.  

## 2021-02-03 NOTE — Op Note (Addendum)
[facility] Patient Name: Alicia Payne Procedure Date: 02/03/2021 1:22 PM MRN: [id number] Endoscopist: Thornton Park MD, MD Age: 55 Referring MD:  Date of Birth: [birth year] Gender: Female Account #: [id number] Procedure:                Colonoscopy Indications:              Screening for colorectal malignant neoplasm, This                            is the patient's first colonoscopy                           No known family history of colon cancer or polyps Medicines:                Monitored Anesthesia Care Procedure:                Pre-Anesthesia Assessment:                           - Prior to the procedure, a History and Physical                            was performed, and patient medications and                            allergies were reviewed. The patient's tolerance of                            previous anesthesia was also reviewed. The risks                            and benefits of the procedure and the sedation                            options and risks were discussed with the patient.                            All questions were answered, and informed consent                            was obtained. Prior Anticoagulants: The patient has                            taken no previous anticoagulant or antiplatelet                            agents. ASA Grade Assessment: II - A patient with                            mild systemic disease. After reviewing the risks                            and benefits, the patient was deemed in  satisfactory condition to undergo the procedure.                           After obtaining informed consent, the colonoscope                            was passed under direct vision. Throughout the                            procedure, the patient's blood pressure, pulse, and                            oxygen saturations were monitored continuously. The                            Olympus CF-HQ190L  [id number]) Colonoscope was                            introduced through the anus and advanced to the 3                            cm into the ileum. A second forward view of the                            right colon was performed. The colonoscopy was                            performed without difficulty. The patient tolerated                            the procedure well. The quality of the bowel                            preparation was good. The terminal ileum, ileocecal                            valve, appendiceal orifice, and rectum were                            photographed. Scope In: 1:28:58 PM Scope Out: 1:46:39 PM Scope Withdrawal Time: 0 hours 14 minutes 26 seconds  Total Procedure Duration: 0 hours 17 minutes 41 seconds  Findings:                 The perianal and digital rectal examinations were                            normal.                           A 2 mm polyp was found in the rectum. The polyp was                            sessile. The polyp was removed with a cold snare.  Resection and retrieval were complete. Estimated                            blood loss was minimal.                           A 3 mm polyp was found in the sigmoid colon located                            30 cm from the anal verge. The polyp was sessile.                            The polyp was removed with a cold snare. Resection                            and retrieval were complete. Estimated blood loss:                            Minimal.                           A 3 mm polyp was found in the transverse colon. The                            polyp was sessile. The polyp was removed with a                            cold snare. Resection and retrieval were complete.                            Estimated blood loss was minimal.                           Three sessile polyps were found in the ascending                            colon. The polyps were 2 to 4 mm in  size. These                            polyps were removed with a cold snare. Resection                            and retrieval were complete. Estimated blood loss                            was minimal.                           A less than 1 mm polyp was found in the ileocecal                            valve. The polyp was flat. The polyp was removed  with a cold biopsy forceps. Resection and retrieval                            were complete. Estimated blood loss: none.                           The exam was otherwise without abnormality on                            direct and retroflexion views. Complications:            No immediate complications. Estimated blood loss:                            Minimal. Estimated Blood Loss:     Estimated blood loss was minimal. Impression:               - One 2 mm polyp in the rectum, removed with a cold                            snare. Resected and retrieved.                           - One 3 mm polyp in the sigmoid colon, removed with                            a cold snare. Resected and retrieved.                           - One 3 mm polyp in the transverse colon, removed                            with a cold snare. Resected and retrieved.                           - Three 2 to 4 mm polyps in the ascending colon,                            removed with a cold snare. Resected and retrieved.                           - One less than 1 mm polyp at the ileocecal valve,                            removed with a cold biopsy forceps. Resected and                            retrieved.                           - The examination was otherwise normal on direct                            and retroflexion views. Recommendation:           -  Patient has a contact number available for                            emergencies. The signs and symptoms of potential                            delayed complications were discussed with  the                            patient. Return to normal activities tomorrow.                            Written discharge instructions were provided to the                            patient.                           - Resume previous diet.                           - Continue present medications.                           - Await pathology results.                           - Repeat colonoscopy date to be determined after                            pending pathology results are reviewed for                            surveillance.                           - Emerging evidence supports eating a diet of                            fruits, vegetables, grains, calcium, and yogurt                            while reducing red meat and alcohol may reduce the                            risk of colon cancer.                           - Given these results, all first degree relatives                            (brothers, sisters, children, parents) should start                            colon cancer screening at age 5.                           -  Thank you for allowing me to be involved in your                            colon cancer prevention. Tressia Danas MD, MD 02/03/2021 1:56:23 PM This report has been signed electronically.

## 2021-02-03 NOTE — Progress Notes (Signed)
Vitals-SH  Pt's states no medical or surgical changes since previsit or office visit. 

## 2021-02-06 ENCOUNTER — Other Ambulatory Visit: Payer: Self-pay

## 2021-02-06 ENCOUNTER — Encounter (INDEPENDENT_AMBULATORY_CARE_PROVIDER_SITE_OTHER): Payer: Self-pay | Admitting: Family Medicine

## 2021-02-06 ENCOUNTER — Ambulatory Visit (INDEPENDENT_AMBULATORY_CARE_PROVIDER_SITE_OTHER): Payer: No Typology Code available for payment source | Admitting: Family Medicine

## 2021-02-06 ENCOUNTER — Other Ambulatory Visit (HOSPITAL_COMMUNITY): Payer: Self-pay

## 2021-02-06 VITALS — BP 112/68 | HR 66 | Temp 97.6°F | Ht 68.0 in | Wt 224.0 lb

## 2021-02-06 DIAGNOSIS — E559 Vitamin D deficiency, unspecified: Secondary | ICD-10-CM | POA: Diagnosis not present

## 2021-02-06 DIAGNOSIS — Z6838 Body mass index (BMI) 38.0-38.9, adult: Secondary | ICD-10-CM

## 2021-02-06 DIAGNOSIS — F430 Acute stress reaction: Secondary | ICD-10-CM | POA: Diagnosis not present

## 2021-02-06 DIAGNOSIS — Z9189 Other specified personal risk factors, not elsewhere classified: Secondary | ICD-10-CM

## 2021-02-06 MED ORDER — VITAMIN D (ERGOCALCIFEROL) 1.25 MG (50000 UNIT) PO CAPS
50000.0000 [IU] | ORAL_CAPSULE | ORAL | 0 refills | Status: DC
  Filled 2021-02-06: qty 4, 28d supply, fill #0

## 2021-02-07 ENCOUNTER — Telehealth: Payer: Self-pay | Admitting: *Deleted

## 2021-02-07 NOTE — Telephone Encounter (Signed)
  Follow up Call-  Call back number 02/03/2021  Post procedure Call Back phone  # [phone]  Permission to leave phone message Yes  Some recent data might be hidden     Patient questions:  Do you have a fever, pain , or abdominal swelling? No. Pain Score  0 *  Have you tolerated food without any problems? Yes.    Have you been able to return to your normal activities? Yes.    Do you have any questions about your discharge instructions: Diet   No. Medications  No. Follow up visit  No.  Do you have questions or concerns about your Care? No.  Actions: * If pain score is 4 or above: No action needed, pain <4.  1. Have you developed a fever since your procedure? No   2.   Have you had an respiratory symptoms (SOB or cough) since your procedure? no  3.   Have you tested positive for COVID 19 since your procedure no  4.   Have you had any family members/close contacts diagnosed with the COVID 19 since your procedure?  no   If yes to any of these questions please route to Joylene John, RN and Joella Prince, RN

## 2021-02-14 ENCOUNTER — Other Ambulatory Visit (HOSPITAL_COMMUNITY): Payer: Self-pay

## 2021-02-14 NOTE — Progress Notes (Signed)
Chief Complaint:   OBESITY Alicia Payne is here to discuss her progress with her obesity treatment plan along with follow-up of her obesity related diagnoses.   Today's visit was #: 10 Starting weight: 250 lbs Starting date: 05/10/2020 Today's weight: 224 lbs Today's date: 02/06/2021 Weight change since last visit: 1 lb Total lbs lost to date: 26 lbs Body mass index is 34.06 kg/m.  Total weight loss percentage to date: -10.40%  Interim History:  Alicia Payne was last seen in person on 10/04/2020.  She lost her husband on February 1st, and was lost to follow-up until now.  Still difficult to handle emotionally but she would like to focus on her health and get off mood medications now.  Current Meal Plan: the Category 3 Plan for 50% of the time.  Current Exercise Plan: Walking the dog 2 times per day for 10-15 minutes 7 times per week.  Assessment/Plan:   No orders of the defined types were placed in this encounter.   Medications Discontinued During This Encounter  Medication Reason  . Vitamin D, Ergocalciferol, (DRISDOL) 1.25 MG (50000 UNIT) CAPS capsule Reorder     Meds ordered this encounter  Medications  . Vitamin D, Ergocalciferol, (DRISDOL) 1.25 MG (50000 UNIT) CAPS capsule    Sig: Take 1 capsule (50,000 Units total) by mouth every 7 (seven) days.    Dispense:  4 capsule    Refill:  0    1. Vitamin D deficiency Improving, but not optimized. Current vitamin D is 45.8, tested on 10/04/2020. Optimal goal > 50 ng/dL.  She is taking vitamin D 50,000 IU weekly.  Plan: Continue to take prescription Vitamin D @50 ,000 IU every week as prescribed.  Follow-up for routine testing of Vitamin D, at least 2-3 times per year to avoid over-replacement.  - Refill Vitamin D, Ergocalciferol, (DRISDOL) 1.25 MG (50000 UNIT) CAPS capsule; Take 1 capsule (50,000 Units total) by mouth every 7 (seven) days.  Dispense: 4 capsule; Refill: 0  2. Acute reaction to stress, with emotional eating Not at  goal. Medication: None.  She has been off her Wellbutrin for 2 months now.  She does not feel she needs it and she took herself off.  She says it is no different with or without it.  Wants to stay off.  Plan:  Long discussion regarding emotional support groups and strategies.  Grief counseling done.  Recommend EAP as needed and increase exercise to help manage stress.  Journal as needed.  Behavior modification techniques were discussed today to help deal with emotional/non-hunger eating behaviors.  3. Obesity, current BMI 34.1  Course: Alicia Payne is currently in the action stage of change. As such, her goal is to continue with weight loss efforts.   Nutrition goals: She has agreed to the Category 3 Plan with breakfast options.   Exercise goals: For substantial health benefits, adults should do at least 150 minutes (2 hours and 30 minutes) a week of moderate-intensity, or 75 minutes (1 hour and 15 minutes) a week of vigorous-intensity aerobic physical activity, or an equivalent combination of moderate- and vigorous-intensity aerobic activity. Aerobic activity should be performed in episodes of at least 10 minutes, and preferably, it should be spread throughout the week.  Behavioral modification strategies: increasing water intake, keeping healthy foods in the home, emotional eating strategies, avoiding temptations and planning for success.  Alicia Payne has agreed to follow-up with our clinic in 3-4 weeks. She was informed of the importance of frequent follow-up visits to maximize her  success with intensive lifestyle modifications for her multiple health conditions.   Objective:   Blood pressure 112/68, pulse 66, temperature 97.6 F (36.4 C), height 5\' 8"  (1.727 m), weight 224 lb (101.6 kg), last menstrual period 11/20/2014, SpO2 98 %. Body mass index is 34.06 kg/m.  General: Cooperative, alert, well developed, in no acute distress. HEENT: Conjunctivae and lids unremarkable. Cardiovascular: Regular  rhythm.  Lungs: Normal work of breathing. Neurologic: No focal deficits.   Lab Results  Component Value Date   CREATININE 0.81 10/04/2020   BUN 17 10/04/2020   NA 143 10/04/2020   K 4.7 10/04/2020   CL 106 10/04/2020   CO2 22 10/04/2020   Lab Results  Component Value Date   ALT 22 10/04/2020   AST 14 10/04/2020   ALKPHOS 73 10/04/2020   BILITOT 0.5 10/04/2020   Lab Results  Component Value Date   HGBA1C 5.9 (H) 10/04/2020   HGBA1C 6.0 (H) 05/10/2020   Lab Results  Component Value Date   INSULIN 9.1 10/04/2020   INSULIN 16.9 05/10/2020   Lab Results  Component Value Date   TSH 1.220 05/10/2020   Lab Results  Component Value Date   CHOL 248 (H) 10/04/2020   HDL 51 10/04/2020   LDLCALC 175 (H) 10/04/2020   TRIG 121 10/04/2020   CHOLHDL 4.9 (H) 10/04/2020   Lab Results  Component Value Date   WBC 6.5 05/10/2020   HGB 16.3 (H) 05/10/2020   HCT 48.5 (H) 05/10/2020   MCV 91 05/10/2020   PLT 245 05/10/2020   Attestation Statements:   Reviewed by clinician on day of visit: allergies, medications, problem list, medical history, surgical history, family history, social history, and previous encounter notes.  Time spent on visit including pre-visit chart review and post-visit care and charting was >43 minutes.   I, Water quality scientist, CMA, am acting as Location manager for [employer], DO.  I have reviewed the above documentation for accuracy and completeness, and I agree with the above. Marjory Sneddon, D.O.  The [place] was signed into law in 2016 which includes the topic of electronic health records.  This provides immediate access to information in MyChart.  This includes consultation notes, operative notes, office notes, lab results and pathology reports.  If you have any questions about what you read please let us know at your next visit so we can discuss your concerns and take corrective action if need be.  We are right here with you.

## 2021-02-15 ENCOUNTER — Encounter: Payer: Self-pay | Admitting: Gastroenterology

## 2021-03-01 ENCOUNTER — Other Ambulatory Visit: Payer: Self-pay

## 2021-03-01 ENCOUNTER — Ambulatory Visit (INDEPENDENT_AMBULATORY_CARE_PROVIDER_SITE_OTHER): Payer: No Typology Code available for payment source | Admitting: Family Medicine

## 2021-03-01 ENCOUNTER — Encounter (INDEPENDENT_AMBULATORY_CARE_PROVIDER_SITE_OTHER): Payer: Self-pay | Admitting: Family Medicine

## 2021-03-01 ENCOUNTER — Other Ambulatory Visit (HOSPITAL_COMMUNITY): Payer: Self-pay

## 2021-03-01 VITALS — BP 122/77 | HR 71 | Temp 97.8°F | Ht 68.0 in | Wt 224.0 lb

## 2021-03-01 DIAGNOSIS — E559 Vitamin D deficiency, unspecified: Secondary | ICD-10-CM

## 2021-03-01 DIAGNOSIS — Z6838 Body mass index (BMI) 38.0-38.9, adult: Secondary | ICD-10-CM

## 2021-03-01 DIAGNOSIS — Z9189 Other specified personal risk factors, not elsewhere classified: Secondary | ICD-10-CM | POA: Diagnosis not present

## 2021-03-01 DIAGNOSIS — R7303 Prediabetes: Secondary | ICD-10-CM | POA: Diagnosis not present

## 2021-03-01 MED ORDER — OZEMPIC (0.25 OR 0.5 MG/DOSE) 2 MG/1.5ML ~~LOC~~ SOPN
0.5000 mg | PEN_INJECTOR | SUBCUTANEOUS | 0 refills | Status: DC
  Filled 2021-03-01: qty 1.5, 28d supply, fill #0

## 2021-03-01 MED ORDER — VITAMIN D (ERGOCALCIFEROL) 1.25 MG (50000 UNIT) PO CAPS
50000.0000 [IU] | ORAL_CAPSULE | ORAL | 0 refills | Status: DC
  Filled 2021-03-01: qty 4, 28d supply, fill #0

## 2021-03-02 ENCOUNTER — Other Ambulatory Visit (HOSPITAL_COMMUNITY): Payer: Self-pay

## 2021-03-13 NOTE — Progress Notes (Signed)
Chief Complaint:   OBESITY Alicia Payne is here to discuss her progress with her obesity treatment plan along with follow-up of her obesity related diagnoses.   Today's visit was #: 11 Starting weight: 250 lbs Starting date: 05/10/2020 Today's weight: 224 lbs Today's date: 03/01/2021 Weight change since last visit: 0 Total lbs lost to date: 26 lbs Body mass index is 34.06 kg/m.  Total weight loss percentage to date: -10.40%  Interim History:  [name] says she is struggling with meal planning and cooking for just herself now.  Current Meal Plan: the Category 3 Plan with breakfast options for 80% of the time.  Current Exercise Plan: Walking the dog 2 times per day for 30 minutes 7 times per week.  Assessment/Plan:   Medications Discontinued During This Encounter  Medication Reason   Vitamin D, Ergocalciferol, (DRISDOL) 1.25 MG (50000 UNIT) CAPS capsule Reorder   Meds ordered this encounter  Medications   Vitamin D, Ergocalciferol, (DRISDOL) 1.25 MG (50000 UNIT) CAPS capsule    Sig: Take 1 capsule (50,000 Units total) by mouth every 7 (seven) days.    Dispense:  4 capsule    Refill:  0   Semaglutide,0.25 or 0.5MG /DOS, (OZEMPIC, 0.25 OR 0.5 MG/DOSE,) 2 MG/1.5ML SOPN    Sig: Inject 0.5 mg into the skin once a week.    Dispense:  1.5 mL    Refill:  0    1. Prediabetes Not at goal. Goal is HgbA1c < 5.7.  Medication: None.  [name] is struggling with sweets/carb cravings and is asking about a medication to augment this.  Plan:  She will continue to focus on protein-rich, low simple carbohydrate foods. We reviewed the importance of hydration, regular exercise for stress reduction, and restorative sleep.  Will start Ozempic 0.5 mg subcutaneously weekly.  Lab Results  Component Value Date   HGBA1C 5.9 (H) 10/04/2020   Lab Results  Component Value Date   INSULIN 9.1 10/04/2020   INSULIN 16.9 05/10/2020   - Start Semaglutide,0.25 or 0.5MG /DOS, (OZEMPIC, 0.25 OR 0.5 MG/DOSE,)  2 MG/1.5ML SOPN; Inject 0.5 mg into the skin once a week.  Dispense: 1.5 mL; Refill: 0  2. Vitamin D deficiency Improving, but not optimized. Current vitamin D is 45.8, tested on 10/04/2020. Optimal goal > 50 ng/dL.   Plan: Continue to take prescription Vitamin D @50 ,000 IU every week as prescribed.  Follow-up for routine testing of Vitamin D, at least 2-3 times per year to avoid over-replacement.  - Refill Vitamin D, Ergocalciferol, (DRISDOL) 1.25 MG (50000 UNIT) CAPS capsule; Take 1 capsule (50,000 Units total) by mouth every 7 (seven) days.  Dispense: 4 capsule; Refill: 0  3. At risk for malnutrition Alicia Payne was given extensive malnutrition prevention education and counseling today of more than 8 minutes.  Counseled her that malnutrition refers to inappropriate nutrients or not the right balance of nutrients for optimal health.  Discussed with [name] that it is absolutely possible to be malnourished but yet obese.  Risk factors, including but not limited to, inappropriate dietary choices, difficulty with obtaining food due to physical or financial limitations, and various physical and mental health conditions were reviewed with [facility].    4. Obesity,current BMI 34.1  - Start Semaglutide,0.25 or 0.5MG /DOS, (OZEMPIC, 0.25 OR 0.5 MG/DOSE,) 2 MG/1.5ML SOPN; Inject 0.5 mg into the skin once a week.  Dispense: 1.5 mL; Refill: 0  Course: Ece is currently in the action stage of change. As such, her goal is to  continue with weight loss efforts.   Nutrition goals: She has agreed to the Category 3 Plan.   Exercise goals:  As is.  Behavioral modification strategies: increasing lean protein intake, decreasing simple carbohydrates, no skipping meals, meal planning and cooking strategies, and planning for success.  [name] has agreed to follow-up with our clinic in 3 weeks. She was informed of the importance of frequent follow-up visits to maximize her success  with intensive lifestyle modifications for her multiple health conditions.   Objective:   Blood pressure 122/77, pulse 71, temperature 97.8 F (36.6 C), height 5\' 8"  (1.727 m), weight 224 lb (101.6 kg), last menstrual period 11/20/2014, SpO2 97 %. Body mass index is 34.06 kg/m.  General: Cooperative, alert, well developed, in no acute distress. HEENT: Conjunctivae and lids unremarkable. Cardiovascular: Regular rhythm.  Lungs: Normal work of breathing. Neurologic: No focal deficits.   Lab Results  Component Value Date   CREATININE 0.81 10/04/2020   BUN 17 10/04/2020   NA 143 10/04/2020   K 4.7 10/04/2020   CL 106 10/04/2020   CO2 22 10/04/2020   Lab Results  Component Value Date   ALT 22 10/04/2020   AST 14 10/04/2020   ALKPHOS 73 10/04/2020   BILITOT 0.5 10/04/2020   Lab Results  Component Value Date   HGBA1C 5.9 (H) 10/04/2020   HGBA1C 6.0 (H) 05/10/2020   Lab Results  Component Value Date   INSULIN 9.1 10/04/2020   INSULIN 16.9 05/10/2020   Lab Results  Component Value Date   TSH 1.220 05/10/2020   Lab Results  Component Value Date   CHOL 248 (H) 10/04/2020   HDL 51 10/04/2020   LDLCALC 175 (H) 10/04/2020   TRIG 121 10/04/2020   CHOLHDL 4.9 (H) 10/04/2020   Lab Results  Component Value Date   WBC 6.5 05/10/2020   HGB 16.3 (H) 05/10/2020   HCT 48.5 (H) 05/10/2020   MCV 91 05/10/2020   PLT 245 05/10/2020   Attestation Statements:   Reviewed by clinician on day of visit: allergies, medications, problem list, medical history, surgical history, family history, social history, and previous encounter notes.  I, Water quality scientist, CMA, am acting as Location manager for [employer], DO.  I have reviewed the above documentation for accuracy and completeness, and I agree with the above. Marjory Sneddon, D.O.  The [place] was signed into law in 2016 which includes the topic of electronic health records.  This provides immediate access to  information in MyChart.  This includes consultation notes, operative notes, office notes, lab results and pathology reports.  If you have any questions about what you read please let us know at your next visit so we can discuss your concerns and take corrective action if need be.  We are right here with you.

## 2021-03-27 ENCOUNTER — Ambulatory Visit (INDEPENDENT_AMBULATORY_CARE_PROVIDER_SITE_OTHER): Payer: No Typology Code available for payment source | Admitting: Family Medicine

## 2021-03-27 ENCOUNTER — Other Ambulatory Visit: Payer: Self-pay

## 2021-03-27 ENCOUNTER — Other Ambulatory Visit (HOSPITAL_COMMUNITY): Payer: Self-pay

## 2021-03-27 VITALS — BP 139/74 | HR 57 | Temp 98.0°F | Ht 68.0 in | Wt 224.0 lb

## 2021-03-27 DIAGNOSIS — Z9189 Other specified personal risk factors, not elsewhere classified: Secondary | ICD-10-CM

## 2021-03-27 DIAGNOSIS — R7303 Prediabetes: Secondary | ICD-10-CM | POA: Diagnosis not present

## 2021-03-27 DIAGNOSIS — E7849 Other hyperlipidemia: Secondary | ICD-10-CM | POA: Diagnosis not present

## 2021-03-27 DIAGNOSIS — D582 Other hemoglobinopathies: Secondary | ICD-10-CM | POA: Diagnosis not present

## 2021-03-27 DIAGNOSIS — Z6838 Body mass index (BMI) 38.0-38.9, adult: Secondary | ICD-10-CM

## 2021-03-27 DIAGNOSIS — E559 Vitamin D deficiency, unspecified: Secondary | ICD-10-CM | POA: Diagnosis not present

## 2021-03-27 MED ORDER — VITAMIN D (ERGOCALCIFEROL) 1.25 MG (50000 UNIT) PO CAPS
50000.0000 [IU] | ORAL_CAPSULE | ORAL | 0 refills | Status: DC
  Filled 2021-03-27: qty 4, 28d supply, fill #0

## 2021-03-27 MED ORDER — OZEMPIC (0.25 OR 0.5 MG/DOSE) 2 MG/1.5ML ~~LOC~~ SOPN
0.5000 mg | PEN_INJECTOR | SUBCUTANEOUS | 0 refills | Status: DC
  Filled 2021-03-27: qty 1.5, 28d supply, fill #0

## 2021-04-05 NOTE — Progress Notes (Signed)
Chief Complaint:   OBESITY Alicia Payne is here to discuss her progress with her obesity treatment plan along with follow-up of her obesity related diagnoses.   Today's visit was #: 12 Starting weight: 250 lbs Starting date: 05/10/2020 Today's weight: 224 lbs Today's date: 03/27/2021 Weight change since last visit: 0 Total lbs lost to date: 26 lbs Body mass index is 34.06 kg/m.  Total weight loss percentage to date: -10.40%  Interim History:  Alicia Payne started Ozempic last office visit.  Helping a lot with hunger and feels full sooner.  Came back from vacation and is happy she did not gain.  Current Meal Plan: the Category 3 Plan for 70-80% of the time.  Current Exercise Plan: Walking the dog for 30 minutes 7 times per week. Current Anti-Obesity Medications: Ozempic 0.5 mg subcutaneously weekly. Side effects: None.  Assessment/Plan:   Orders Placed This Encounter  Procedures   CBC with Differential/Platelet   Hemoglobin A1c   Insulin, random   Lipid Panel With LDL/HDL Ratio   VITAMIN D 25 Hydroxy (Vit-D Deficiency, Fractures)   Medications Discontinued During This Encounter  Medication Reason   Vitamin D, Ergocalciferol, (DRISDOL) 1.25 MG (50000 UNIT) CAPS capsule Reorder   Semaglutide,0.25 or 0.5MG /DOS, (OZEMPIC, 0.25 OR 0.5 MG/DOSE,) 2 MG/1.5ML SOPN Reorder    Meds ordered this encounter  Medications   Semaglutide,0.25 or 0.5MG /DOS, (OZEMPIC, 0.25 OR 0.5 MG/DOSE,) 2 MG/1.5ML SOPN    Sig: Inject 0.5 mg into the skin once a week.    Dispense:  1.5 mL    Refill:  0   Vitamin D, Ergocalciferol, (DRISDOL) 1.25 MG (50000 UNIT) CAPS capsule    Sig: Take 1 capsule (50,000 Units total) by mouth every 7 (seven) days.    Dispense:  4 capsule    Refill:  0    1. Prediabetes Not at goal. Goal is HgbA1c < 5.7.  Medication: Ozempic 0.5 mg subcutaneously weekly.  She is still eating all of her foods, just has less interest in unhealthy foods now.   Plan:  Refill Ozempic.  She  will continue to focus on protein-rich, low simple carbohydrate foods. We reviewed the importance of hydration, regular exercise for stress reduction, and restorative sleep.  Will check A1c and insulin at next office visit.  Lab Results  Component Value Date   HGBA1C 5.9 (H) 10/04/2020   Lab Results  Component Value Date   INSULIN 9.1 10/04/2020   INSULIN 16.9 05/10/2020   - Refill Semaglutide,0.25 or 0.5MG /DOS, (OZEMPIC, 0.25 OR 0.5 MG/DOSE,) 2 MG/1.5ML SOPN; Inject 0.5 mg into the skin once a week.  Dispense: 1.5 mL; Refill: 0 - Hemoglobin A1c - Insulin, random  2. Vitamin D deficiency Improving, but not optimized.  She is taking vitamin D 50,000 IU weekly.  Plan: Continue to take prescription Vitamin D @50 ,000 IU every week as prescribed.  Will check vitamin D level at next office visit.  Lab Results  Component Value Date   VD25OH 45.8 10/04/2020   VD25OH 32.9 05/10/2020   VD25OH 30 03/13/2018   - Refill Vitamin D, Ergocalciferol, (DRISDOL) 1.25 MG (50000 UNIT) CAPS capsule; Take 1 capsule (50,000 Units total) by mouth every 7 (seven) days.  Dispense: 4 capsule; Refill: 0 - VITAMIN D 25 Hydroxy (Vit-D Deficiency, Fractures)  3. Other hyperlipidemia Course: Not at goal. Lipid-lowering medications: Omega-3.   Plan: Dietary changes: Increase soluble fiber, decrease simple carbohydrates, decrease saturated fat. Exercise changes: Moderate to vigorous-intensity aerobic activity 150 minutes per week or as  tolerated. We will continue to monitor along with PCP/specialists as it pertains to her weight loss journey.  Will check lipid panel at next office visit.  Lab Results  Component Value Date   CHOL 248 (H) 10/04/2020   HDL 51 10/04/2020   LDLCALC 175 (H) 10/04/2020   TRIG 121 10/04/2020   CHOLHDL 4.9 (H) 10/04/2020   Lab Results  Component Value Date   ALT 22 10/04/2020   AST 14 10/04/2020   ALKPHOS 73 10/04/2020   BILITOT 0.5 10/04/2020   The 10-year ASCVD risk score  Alicia Bussing DC Jr., et al., 2013) is: 3.1%   Values used to calculate the score:     Age: 55 years     Sex: Female     Is Non-Hispanic African American: No     Diabetic: No     Tobacco smoker: No     Systolic Blood Pressure: 301 mmHg     Is BP treated: No     HDL Cholesterol: 51 mg/dL     Total Cholesterol: 248 mg/dL  - Lipid Panel With LDL/HDL Ratio  4. Elevated hemoglobin (HCC) H/H 16.3 and 48.5, respectively, around 10 months ago.  Asymptomatic.  No concerns.   Plan:  Recheck CBC at next visit.  - CBC with Differential/Platelet  5. At risk for diabetes mellitus - Alicia Payne was given diabetes prevention education and counseling today of more than 9 minutes.  - Counseled patient on pathophysiology of disease and meaning/ implication of lab results.  - Reviewed how certain foods can either stimulate or inhibit insulin release, and subsequently affect hunger pathways  - Importance of following a healthy meal plan with limiting amounts of simple carbohydrates discussed with patient - Effects of regular aerobic exercise on blood sugar regulation reviewed and encouraged an eventual goal of 30 min 5d/week or more as a minimum.  - Briefly discussed treatment options, which always include dietary and lifestyle modification as first line.   - Handouts provided at patient's desire and/or told to go online to the American Diabetes Association website for further information.  6. Obesity,current BMI 34.1  - Refill Semaglutide,0.25 or 0.5MG /DOS, (OZEMPIC, 0.25 OR 0.5 MG/DOSE,) 2 MG/1.5ML SOPN; Inject 0.5 mg into the skin once a week.  Dispense: 1.5 mL; Refill: 0  Course: Alicia Payne is currently in the action stage of change. As such, her goal is to continue with weight loss efforts.   Nutrition goals: She has agreed to the Category 3 Plan.   Exercise goals:  As is.  Behavioral modification strategies: increasing lean protein intake, decreasing simple carbohydrates, and planning for  success.  Alicia Payne has agreed to follow-up with our clinic in 3 weeks, fasting. She was informed of the importance of frequent follow-up visits to maximize her success with intensive lifestyle modifications for her multiple health conditions.   Objective:   Blood pressure 139/74, pulse (!) 57, temperature 98 F (36.7 C), height 5\' 8"  (1.727 m), weight 224 lb (101.6 kg), last menstrual period 11/20/2014, SpO2 98 %. Body mass index is 34.06 kg/m.  General: Cooperative, alert, well developed, in no acute distress. HEENT: Conjunctivae and lids unremarkable. Cardiovascular: Regular rhythm.  Lungs: Normal work of breathing. Neurologic: No focal deficits.   Lab Results  Component Value Date   CREATININE 0.81 10/04/2020   BUN 17 10/04/2020   NA 143 10/04/2020   K 4.7 10/04/2020   CL 106 10/04/2020   CO2 22 10/04/2020   Lab Results  Component Value Date   ALT 22  10/04/2020   AST 14 10/04/2020   ALKPHOS 73 10/04/2020   BILITOT 0.5 10/04/2020   Lab Results  Component Value Date   HGBA1C 5.9 (H) 10/04/2020   HGBA1C 6.0 (H) 05/10/2020   Lab Results  Component Value Date   INSULIN 9.1 10/04/2020   INSULIN 16.9 05/10/2020   Lab Results  Component Value Date   TSH 1.220 05/10/2020   Lab Results  Component Value Date   CHOL 248 (H) 10/04/2020   HDL 51 10/04/2020   LDLCALC 175 (H) 10/04/2020   TRIG 121 10/04/2020   CHOLHDL 4.9 (H) 10/04/2020   Lab Results  Component Value Date   VD25OH 45.8 10/04/2020   VD25OH 32.9 05/10/2020   VD25OH 30 03/13/2018   Lab Results  Component Value Date   WBC 6.5 05/10/2020   HGB 16.3 (H) 05/10/2020   HCT 48.5 (H) 05/10/2020   MCV 91 05/10/2020   PLT 245 05/10/2020   Attestation Statements:   Reviewed by clinician on day of visit: allergies, medications, problem list, medical history, surgical history, family history, social history, and previous encounter notes.  I, Water quality scientist, CMA, am acting as Location manager for [employer], DO.  I have reviewed the above documentation for accuracy and completeness, and I agree with the above. Marjory Sneddon, D.O.  The [place] was signed into law in 2016 which includes the topic of electronic health records.  This provides immediate access to information in MyChart.  This includes consultation notes, operative notes, office notes, lab results and pathology reports.  If you have any questions about what you read please let us know at your next visit so we can discuss your concerns and take corrective action if need be.  We are right here with you.

## 2021-04-17 ENCOUNTER — Ambulatory Visit (INDEPENDENT_AMBULATORY_CARE_PROVIDER_SITE_OTHER): Payer: No Typology Code available for payment source | Admitting: Family Medicine

## 2021-04-21 LAB — CBC WITH DIFFERENTIAL/PLATELET
Basophils Absolute: 0 10*3/uL (ref 0.0–0.2)
Basos: 0 %
EOS (ABSOLUTE): 0.1 10*3/uL (ref 0.0–0.4)
Eos: 2 %
Hematocrit: 46.3 % (ref 34.0–46.6)
Hemoglobin: 15.7 g/dL (ref 11.1–15.9)
Immature Grans (Abs): 0 10*3/uL (ref 0.0–0.1)
Immature Granulocytes: 0 %
Lymphocytes Absolute: 1.3 10*3/uL (ref 0.7–3.1)
Lymphs: 21 %
MCH: 30.8 pg (ref 26.6–33.0)
MCHC: 33.9 g/dL (ref 31.5–35.7)
MCV: 91 fL (ref 79–97)
Monocytes Absolute: 0.4 10*3/uL (ref 0.1–0.9)
Monocytes: 7 %
Neutrophils Absolute: 4.1 10*3/uL (ref 1.4–7.0)
Neutrophils: 70 %
Platelets: 204 10*3/uL (ref 150–450)
RBC: 5.1 x10E6/uL (ref 3.77–5.28)
RDW: 12.9 % (ref 11.7–15.4)
WBC: 6 10*3/uL (ref 3.4–10.8)

## 2021-04-21 LAB — LIPID PANEL WITH LDL/HDL RATIO
Cholesterol, Total: 268 mg/dL — ABNORMAL HIGH (ref 100–199)
HDL: 52 mg/dL (ref >39)
LDL Chol Calc (NIH): 194 mg/dL — ABNORMAL HIGH (ref 0–99)
LDL/HDL Ratio: 3.7 ratio — ABNORMAL HIGH (ref 0.0–3.2)
Triglycerides: 120 mg/dL (ref 0–149)
VLDL Cholesterol Cal: 22 mg/dL (ref 5–40)

## 2021-04-21 LAB — VITAMIN D 25 HYDROXY (VIT D DEFICIENCY, FRACTURES): Vit D, 25-Hydroxy: 53.2 ng/mL (ref 30.0–100.0)

## 2021-04-21 LAB — INSULIN, RANDOM: INSULIN: 9.9 u[IU]/mL (ref 2.6–24.9)

## 2021-04-21 LAB — HEMOGLOBIN A1C
Est. average glucose Bld gHb Est-mCnc: 120 mg/dL
Hgb A1c MFr Bld: 5.8 % — ABNORMAL HIGH (ref 4.8–5.6)

## 2021-04-24 ENCOUNTER — Ambulatory Visit (INDEPENDENT_AMBULATORY_CARE_PROVIDER_SITE_OTHER): Payer: No Typology Code available for payment source | Admitting: Family Medicine

## 2021-04-24 ENCOUNTER — Encounter (INDEPENDENT_AMBULATORY_CARE_PROVIDER_SITE_OTHER): Payer: Self-pay | Admitting: Family Medicine

## 2021-04-24 ENCOUNTER — Other Ambulatory Visit: Payer: Self-pay

## 2021-04-24 ENCOUNTER — Other Ambulatory Visit (HOSPITAL_COMMUNITY): Payer: Self-pay

## 2021-04-24 VITALS — BP 116/68 | HR 72 | Temp 97.8°F | Ht 68.0 in | Wt 220.0 lb

## 2021-04-24 DIAGNOSIS — D582 Other hemoglobinopathies: Secondary | ICD-10-CM

## 2021-04-24 DIAGNOSIS — Z6838 Body mass index (BMI) 38.0-38.9, adult: Secondary | ICD-10-CM

## 2021-04-24 DIAGNOSIS — E7849 Other hyperlipidemia: Secondary | ICD-10-CM | POA: Diagnosis not present

## 2021-04-24 DIAGNOSIS — E559 Vitamin D deficiency, unspecified: Secondary | ICD-10-CM | POA: Diagnosis not present

## 2021-04-24 DIAGNOSIS — Z9189 Other specified personal risk factors, not elsewhere classified: Secondary | ICD-10-CM

## 2021-04-24 DIAGNOSIS — R7303 Prediabetes: Secondary | ICD-10-CM | POA: Diagnosis not present

## 2021-04-24 MED ORDER — VITAMIN D (ERGOCALCIFEROL) 1.25 MG (50000 UNIT) PO CAPS
50000.0000 [IU] | ORAL_CAPSULE | ORAL | 0 refills | Status: DC
  Filled 2021-04-24: qty 4, 28d supply, fill #0

## 2021-04-24 MED ORDER — METFORMIN HCL 500 MG PO TABS
500.0000 mg | ORAL_TABLET | Freq: Every day | ORAL | 0 refills | Status: DC
  Filled 2021-04-24: qty 30, 30d supply, fill #0

## 2021-04-24 MED ORDER — OZEMPIC (0.25 OR 0.5 MG/DOSE) 2 MG/1.5ML ~~LOC~~ SOPN
0.5000 mg | PEN_INJECTOR | SUBCUTANEOUS | 0 refills | Status: DC
Start: 2021-04-24 — End: 2021-05-22
  Filled 2021-04-24: qty 1.5, 28d supply, fill #0

## 2021-04-24 NOTE — Patient Instructions (Signed)
The 10-year ASCVD risk score Mikey Bussing DC Brooke Bonito., et al., 2013) is: 2.4%   Values used to calculate the score:     Age: 55 years     Sex: Female     Is Non-Hispanic African American: No     Diabetic: No     Tobacco smoker: No     Systolic Blood Pressure: 99991111 mmHg     Is BP treated: No     HDL Cholesterol: 52 mg/dL     Total Cholesterol: 268 mg/dL

## 2021-04-25 ENCOUNTER — Other Ambulatory Visit (HOSPITAL_COMMUNITY): Payer: Self-pay

## 2021-05-01 NOTE — Progress Notes (Signed)
Chief Complaint:   OBESITY Alicia Payne is here to discuss her progress with her obesity treatment plan along with follow-up of her obesity related diagnoses. Alicia Payne is on the Category 3 Plan and states she is following her eating plan approximately 85% of the time. Alicia Payne states she is walking the dog slowly for 10-15 minutes 2 times per week.  Today's visit was #: 102 Starting weight: 250 lbs Starting date: 05/10/2020 Today's weight: 220 lbs Today's date: 04/24/2021 Total lbs lost to date: 30 Total lbs lost since last in-office visit: 4  Interim History: Alicia Payne is here for a follow up office visit. We reviewed her meal plan and questions were answered. Patient's food recall appears to be accurate and consistent with what is on plan when she is following it. When eating on plan, her hunger and cravings are well controlled.     Subjective:   1. Prediabetes Alicia Payne has sweet cravings especially in the evenings after dinner. Her insulin level is improving from prior. I discussed labs with the patient today.  2. Vitamin D deficiency Alicia Payne is currently taking prescription vitamin D 50,000 IU each week, and her last Vit D level was at goal. She denies nausea, vomiting or muscle weakness. I discussed labs with the patient today.  3. Elevated hemoglobin (HCC) Alicia Payne's labs are within normal limits now. I discussed labs with the patient today.  4. Other hyperlipidemia Alicia Payne is not on medications. Patient denies myalgias. I discussed labs with the patient today.  The 10-year ASCVD risk score Alicia Payne DC Alicia Bonito., et al., 2013) is: 2.4%   Values used to calculate the score:     Age: 55 years     Sex: Female     Is Non-Hispanic African American: No     Diabetic: No     Tobacco smoker: No     Systolic Blood Pressure: 99991111 mmHg     Is BP treated: No     HDL Cholesterol: 52 mg/dL     Total Cholesterol: 268 mg/dL  5. At risk for activity intolerance Alicia Payne at risk  for exercise intolerance due to lack of activity.  Assessment/Plan:  No orders of the defined types were placed in this encounter.   Medications Discontinued During This Encounter  Medication Reason   Semaglutide,0.25 or 0.'5MG'$ /DOS, (OZEMPIC, 0.25 OR 0.5 MG/DOSE,) 2 MG/1.5ML SOPN Reorder   Vitamin D, Ergocalciferol, (DRISDOL) 1.25 MG (50000 UNIT) CAPS capsule Reorder     Meds ordered this encounter  Medications   Semaglutide,0.25 or 0.'5MG'$ /DOS, (OZEMPIC, 0.25 OR 0.5 MG/DOSE,) 2 MG/1.5ML SOPN    Sig: Inject 0.5 mg into the skin once a week.    Dispense:  1.5 mL    Refill:  0   Vitamin D, Ergocalciferol, (DRISDOL) 1.25 MG (50000 UNIT) CAPS capsule    Sig: Take 1 capsule (50,000 Units total) by mouth every 7 (seven) days.    Dispense:  4 capsule    Refill:  0   metFORMIN (GLUCOPHAGE) 500 MG tablet    Sig: Take 1 tablet (500 mg total) by mouth daily with lunch.    Dispense:  30 tablet    Refill:  0     1. Prediabetes Alicia Payne agreed to start metformin 500 mg q daily with no refills, and we will refill Ozempic for 1 month. She will continue to work on weight loss, exercise, and decreasing simple carbohydrates to help decrease the risk of diabetes.   - Semaglutide,0.25 or 0.'5MG'$ /DOS, (OZEMPIC, 0.25 OR  0.5 MG/DOSE,) 2 MG/1.5ML SOPN; Inject 0.5 mg into the skin once a week.  Dispense: 1.5 mL; Refill: 0 - metFORMIN (GLUCOPHAGE) 500 MG tablet; Take 1 tablet (500 mg total) by mouth daily with lunch.  Dispense: 30 tablet; Refill: 0  2. Vitamin D deficiency Low Vitamin D level contributes to fatigue and are associated with obesity, breast, and colon cancer. Alicia Payne agreed to continue prescription Vitamin D, and we will refill for 1 month. She will follow-up for routine testing of Vitamin D, at least 2-3 times per year to avoid over-replacement.  - Vitamin D, Ergocalciferol, (DRISDOL) 1.25 MG (50000 UNIT) CAPS capsule; Take 1 capsule (50,000 Units total) by mouth every 7 (seven) days.  Dispense:  4 capsule; Refill: 0  3. Elevated hemoglobin (HCC) We will continue to monitor, and Alicia Payne will continue to follow up as directed.  4. Other hyperlipidemia Cardiovascular risk and specific lipid/LDL goals reviewed. We discussed several lifestyle modifications today. Alicia Payne will continue her diet, decrease salt, trans fats, and increase exercise. Orders and follow up as documented in patient record.   Counseling Intensive lifestyle modifications are the first line treatment for this issue. Dietary changes: Increase soluble fiber. Decrease simple carbohydrates. Exercise changes: Moderate to vigorous-intensity aerobic activity 150 minutes per week if tolerated. Lipid-lowering medications: see documented in medical record.  5. At risk for activity intolerance Alicia Payne was given approximately 9 minutes of exercise intolerance counseling today. She is 55 y.o. female and has risk factors exercise intolerance including obesity. We discussed intensive lifestyle modifications today with an emphasis on specific weight loss instructions and strategies. Alicia Payne will slowly increase activity as tolerated.  Repetitive spaced learning was employed today to elicit superior memory formation and behavioral change.  6. Obesity with current BMI of 33.5 Alicia Payne is currently in the action stage of change. As such, her goal is to continue with weight loss efforts. She has agreed to the Category 3 Plan.   Exercise goals: Start walking and exercising for herself. (Walk at home videos if it is too hot for 20 minutes 3 days per week).  Behavioral modification strategies: increasing lean protein intake, ways to avoid boredom eating, and emotional eating strategies.  Alicia Payne has agreed to follow-up with our clinic in 3 weeks. She was informed of the importance of frequent follow-up visits to maximize her success with intensive lifestyle modifications for her multiple health conditions.   Objective:   Blood  pressure 116/68, pulse 72, temperature 97.8 F (36.6 C), height '5\' 8"'$  (1.727 m), weight 220 lb (99.8 kg), last menstrual period 11/20/2014, SpO2 98 %. Body mass index is 33.45 kg/m.  General: Cooperative, alert, well developed, in no acute distress. HEENT: Conjunctivae and lids unremarkable. Cardiovascular: Regular rhythm.  Lungs: Normal work of breathing. Neurologic: No focal deficits.   Lab Results  Component Value Date   CREATININE 0.81 10/04/2020   BUN 17 10/04/2020   NA 143 10/04/2020   K 4.7 10/04/2020   CL 106 10/04/2020   CO2 22 10/04/2020   Lab Results  Component Value Date   ALT 22 10/04/2020   AST 14 10/04/2020   ALKPHOS 73 10/04/2020   BILITOT 0.5 10/04/2020   Lab Results  Component Value Date   HGBA1C 5.8 (H) 04/20/2021   HGBA1C 5.9 (H) 10/04/2020   HGBA1C 6.0 (H) 05/10/2020   Lab Results  Component Value Date   INSULIN 9.9 04/20/2021   INSULIN 9.1 10/04/2020   INSULIN 16.9 05/10/2020   Lab Results  Component Value Date  TSH 1.220 05/10/2020   Lab Results  Component Value Date   CHOL 268 (H) 04/20/2021   HDL 52 04/20/2021   LDLCALC 194 (H) 04/20/2021   TRIG 120 04/20/2021   CHOLHDL 4.9 (H) 10/04/2020   Lab Results  Component Value Date   VD25OH 53.2 04/20/2021   VD25OH 45.8 10/04/2020   VD25OH 32.9 05/10/2020   Lab Results  Component Value Date   WBC 6.0 04/20/2021   HGB 15.7 04/20/2021   HCT 46.3 04/20/2021   MCV 91 04/20/2021   PLT 204 04/20/2021   No results found for: IRON, TIBC, FERRITIN  Attestation Statements:   Reviewed by clinician on day of visit: allergies, medications, problem list, medical history, surgical history, family history, social history, and previous encounter notes.   Wilhemena Durie, am acting as transcriptionist for [employer], DO.  I have reviewed the above documentation for accuracy and completeness, and I agree with the above. Marjory Sneddon, D.O.  The [place] was signed  into law in 2016 which includes the topic of electronic health records.  This provides immediate access to information in MyChart.  This includes consultation notes, operative notes, office notes, lab results and pathology reports.  If you have any questions about what you read please let us know at your next visit so we can discuss your concerns and take corrective action if need be.  We are right here with you.

## 2021-05-15 ENCOUNTER — Ambulatory Visit (INDEPENDENT_AMBULATORY_CARE_PROVIDER_SITE_OTHER): Payer: No Typology Code available for payment source | Admitting: Family Medicine

## 2021-05-22 ENCOUNTER — Other Ambulatory Visit (HOSPITAL_COMMUNITY): Payer: Self-pay

## 2021-05-22 ENCOUNTER — Ambulatory Visit (INDEPENDENT_AMBULATORY_CARE_PROVIDER_SITE_OTHER): Payer: No Typology Code available for payment source | Admitting: Family Medicine

## 2021-05-22 ENCOUNTER — Other Ambulatory Visit: Payer: Self-pay

## 2021-05-22 ENCOUNTER — Encounter (INDEPENDENT_AMBULATORY_CARE_PROVIDER_SITE_OTHER): Payer: Self-pay | Admitting: Family Medicine

## 2021-05-22 VITALS — BP 125/80 | HR 70 | Temp 97.9°F | Ht 68.0 in | Wt 219.0 lb

## 2021-05-22 DIAGNOSIS — Z6838 Body mass index (BMI) 38.0-38.9, adult: Secondary | ICD-10-CM | POA: Diagnosis not present

## 2021-05-22 DIAGNOSIS — R7303 Prediabetes: Secondary | ICD-10-CM | POA: Diagnosis not present

## 2021-05-22 DIAGNOSIS — Z9189 Other specified personal risk factors, not elsewhere classified: Secondary | ICD-10-CM

## 2021-05-22 DIAGNOSIS — E559 Vitamin D deficiency, unspecified: Secondary | ICD-10-CM | POA: Diagnosis not present

## 2021-05-22 MED ORDER — METFORMIN HCL 500 MG PO TABS
500.0000 mg | ORAL_TABLET | Freq: Every day | ORAL | 0 refills | Status: DC
  Filled 2021-05-22: qty 30, 30d supply, fill #0

## 2021-05-22 MED ORDER — SEMAGLUTIDE (1 MG/DOSE) 4 MG/3ML ~~LOC~~ SOPN
1.0000 mg | PEN_INJECTOR | SUBCUTANEOUS | 0 refills | Status: DC
  Filled 2021-05-22: qty 3, 28d supply, fill #0

## 2021-05-22 MED ORDER — VITAMIN D (ERGOCALCIFEROL) 1.25 MG (50000 UNIT) PO CAPS
50000.0000 [IU] | ORAL_CAPSULE | ORAL | 0 refills | Status: DC
  Filled 2021-05-22: qty 4, 28d supply, fill #0

## 2021-05-23 NOTE — Progress Notes (Signed)
Chief Complaint:   OBESITY Alicia Payne is here to discuss her progress with her obesity treatment plan along with follow-up of her obesity related diagnoses. Alicia Payne is on the Category 3 Plan and states she is following her eating plan approximately 70-80% of the time. Alicia Payne states she is walking the dog for 30 minutes 7 times per week.  Today's visit was #: 14 Starting weight: 250 lbs Starting date: 05/10/2020 Today's weight: 219 lbs Today's date: 05/22/2021 Total lbs lost to date: 31 Total lbs lost since last in-office visit: 1  Interim History: Alicia Payne is here for a follow up office visit. We reviewed her meal plan and questions were answered. Patient's food recall appears to be accurate and consistent with what is on plan when she is following it. When eating on plan, her hunger and cravings are well controlled. Alicia Payne is drinking 80 oz of water per day.  Subjective:   1. Pre-diabetes Alicia Payne was started on metformin at her last office visit. She cant tell if it is making a difference or not, but she notes increased stress the past couple of weeks.  2. Vitamin D deficiency Alicia Payne is currently taking prescription vitamin D 50,000 IU each week. She denies nausea, vomiting or muscle weakness.  3. At risk for diabetes mellitus Alicia Payne is at higher than average risk for developing diabetes due to obesity.   Assessment/Plan:  No orders of the defined types were placed in this encounter.   Medications Discontinued During This Encounter  Medication Reason   Semaglutide,0.25 or 0.'5MG'$ /DOS, (OZEMPIC, 0.25 OR 0.5 MG/DOSE,) 2 MG/1.5ML SOPN    Vitamin D, Ergocalciferol, (DRISDOL) 1.25 MG (50000 UNIT) CAPS capsule Reorder   metFORMIN (GLUCOPHAGE) 500 MG tablet Reorder     Meds ordered this encounter  Medications   metFORMIN (GLUCOPHAGE) 500 MG tablet    Sig: Take 1 tablet (500 mg total) by mouth daily with lunch.    Dispense:  30 tablet    Refill:  0    Vitamin D, Ergocalciferol, (DRISDOL) 1.25 MG (50000 UNIT) CAPS capsule    Sig: Take 1 capsule (50,000 Units total) by mouth every 7 (seven) days.    Dispense:  4 capsule    Refill:  0   Semaglutide, 1 MG/DOSE, 4 MG/3ML SOPN    Sig: Inject 1 mg as directed once a week.    Dispense:  3 mL    Refill:  0     1. Pre-diabetes Alicia Payne agreed to increase Ozempic from 0.5 mg to 1 mg q weekly with no refills, and we will refill metformin for 1 month. She will continue to work on weight loss, exercise, and decreasing simple carbohydrates to help decrease the risk of diabetes.   - metFORMIN (GLUCOPHAGE) 500 MG tablet; Take 1 tablet (500 mg total) by mouth daily with lunch.  Dispense: 30 tablet; Refill: 0 - Semaglutide, 1 MG/DOSE, 4 MG/3ML SOPN; Inject 1 mg as directed once a week.  Dispense: 3 mL; Refill: 0  2. Vitamin D deficiency Low Vitamin D level contributes to fatigue and are associated with obesity, breast, and colon cancer. We will refill prescription Vitamin D for 1 month. Alicia Payne will follow-up for routine testing of Vitamin D, at least 2-3 times per year to avoid over-replacement.  - Vitamin D, Ergocalciferol, (DRISDOL) 1.25 MG (50000 UNIT) CAPS capsule; Take 1 capsule (50,000 Units total) by mouth every 7 (seven) days.  Dispense: 4 capsule; Refill: 0  3. At risk for diabetes mellitus -  Alicia Payne was given diabetes prevention education and counseling today of more than 9 minutes.  - Counseled patient on pathophysiology of disease and meaning/ implication of lab results.  - Reviewed how certain foods can either stimulate or inhibit insulin release, and subsequently affect hunger pathways  - Importance of following a healthy meal plan with limiting amounts of simple carbohydrates discussed with patient - Effects of regular aerobic exercise on blood sugar regulation reviewed and encouraged an eventual goal of 30 min 5d/week or more as a minimum.  - Briefly discussed treatment options, which  always include dietary and lifestyle modification as first line.   - Handouts provided at patient's desire and/or told to go online to the American Diabetes Association website for further information.  4. Obesity with current BMI of 33.3 Alicia Payne is currently in the action stage of change. As such, her goal is to continue with weight loss efforts. She has agreed to the Category 3 Plan.   Handout: Recipe I booklet was given.   Exercise goals: As is.  Behavioral modification strategies: increasing lean protein intake and meal planning and cooking strategies.  Alicia Payne has agreed to follow-up with our clinic in 3 to 4 weeks. She was informed of the importance of frequent follow-up visits to maximize her success with intensive lifestyle modifications for her multiple health conditions.   Objective:   Blood pressure 125/80, pulse 70, temperature 97.9 F (36.6 C), height '5\' 8"'$  (1.727 m), weight 219 lb (99.3 kg), last menstrual period 11/20/2014, SpO2 97 %. Body mass index is 33.3 kg/m.  General: Cooperative, alert, well developed, in no acute distress. HEENT: Conjunctivae and lids unremarkable. Cardiovascular: Regular rhythm.  Lungs: Normal work of breathing. Neurologic: No focal deficits.   Lab Results  Component Value Date   CREATININE 0.81 10/04/2020   BUN 17 10/04/2020   NA 143 10/04/2020   K 4.7 10/04/2020   CL 106 10/04/2020   CO2 22 10/04/2020   Lab Results  Component Value Date   ALT 22 10/04/2020   AST 14 10/04/2020   ALKPHOS 73 10/04/2020   BILITOT 0.5 10/04/2020   Lab Results  Component Value Date   HGBA1C 5.8 (H) 04/20/2021   HGBA1C 5.9 (H) 10/04/2020   HGBA1C 6.0 (H) 05/10/2020   Lab Results  Component Value Date   INSULIN 9.9 04/20/2021   INSULIN 9.1 10/04/2020   INSULIN 16.9 05/10/2020   Lab Results  Component Value Date   TSH 1.220 05/10/2020   Lab Results  Component Value Date   CHOL 268 (H) 04/20/2021   HDL 52 04/20/2021   LDLCALC 194 (H)  04/20/2021   TRIG 120 04/20/2021   CHOLHDL 4.9 (H) 10/04/2020   Lab Results  Component Value Date   VD25OH 53.2 04/20/2021   VD25OH 45.8 10/04/2020   VD25OH 32.9 05/10/2020   Lab Results  Component Value Date   WBC 6.0 04/20/2021   HGB 15.7 04/20/2021   HCT 46.3 04/20/2021   MCV 91 04/20/2021   PLT 204 04/20/2021   No results found for: IRON, TIBC, FERRITIN  Attestation Statements:   Reviewed by clinician on day of visit: allergies, medications, problem list, medical history, surgical history, family history, social history, and previous encounter notes.   Alicia Payne, am acting as transcriptionist for [employer], DO.  I have reviewed the above documentation for accuracy and completeness, and I agree with the above. Alicia Payne, D.O.  The [place] was signed into law in  2016 which includes the topic of electronic health records.  This provides immediate access to information in MyChart.  This includes consultation notes, operative notes, office notes, lab results and pathology reports.  If you have any questions about what you read please let us know at your next visit so we can discuss your concerns and take corrective action if need be.  We are right here with you.

## 2021-06-21 ENCOUNTER — Ambulatory Visit (INDEPENDENT_AMBULATORY_CARE_PROVIDER_SITE_OTHER): Payer: No Typology Code available for payment source | Admitting: Family Medicine

## 2021-06-21 ENCOUNTER — Other Ambulatory Visit (HOSPITAL_COMMUNITY): Payer: Self-pay

## 2021-06-21 ENCOUNTER — Other Ambulatory Visit: Payer: Self-pay

## 2021-06-21 ENCOUNTER — Encounter (INDEPENDENT_AMBULATORY_CARE_PROVIDER_SITE_OTHER): Payer: Self-pay | Admitting: Family Medicine

## 2021-06-21 VITALS — BP 109/70 | HR 72 | Temp 97.5°F | Ht 68.0 in | Wt 218.0 lb

## 2021-06-21 DIAGNOSIS — Z9189 Other specified personal risk factors, not elsewhere classified: Secondary | ICD-10-CM

## 2021-06-21 DIAGNOSIS — Z6838 Body mass index (BMI) 38.0-38.9, adult: Secondary | ICD-10-CM | POA: Diagnosis not present

## 2021-06-21 DIAGNOSIS — R7303 Prediabetes: Secondary | ICD-10-CM

## 2021-06-21 DIAGNOSIS — E559 Vitamin D deficiency, unspecified: Secondary | ICD-10-CM | POA: Diagnosis not present

## 2021-06-21 MED ORDER — METFORMIN HCL 500 MG PO TABS
500.0000 mg | ORAL_TABLET | Freq: Every day | ORAL | 0 refills | Status: DC
  Filled 2021-06-21: qty 30, 30d supply, fill #0

## 2021-06-21 MED ORDER — SEMAGLUTIDE (1 MG/DOSE) 4 MG/3ML ~~LOC~~ SOPN
1.0000 mg | PEN_INJECTOR | SUBCUTANEOUS | 0 refills | Status: DC
  Filled 2021-06-21: qty 3, 28d supply, fill #0

## 2021-06-21 MED ORDER — VITAMIN D (ERGOCALCIFEROL) 1.25 MG (50000 UNIT) PO CAPS
50000.0000 [IU] | ORAL_CAPSULE | ORAL | 0 refills | Status: DC
  Filled 2021-06-21: qty 4, 28d supply, fill #0

## 2021-06-21 NOTE — Patient Instructions (Signed)
The 10-year ASCVD risk score (Arnett DK, et al., 2019) is: 2.1%   Values used to calculate the score:     Age: 55 years     Sex: Female     Is Non-Hispanic African American: No     Diabetic: No     Tobacco smoker: No     Systolic Blood Pressure: 979 mmHg     Is BP treated: No     HDL Cholesterol: 52 mg/dL     Total Cholesterol: 268 mg/dL

## 2021-06-22 NOTE — Progress Notes (Signed)
Chief Complaint:   OBESITY Alicia Payne is here to discuss her progress with her obesity treatment plan along with follow-up of her obesity related diagnoses. Alicia Payne is on the Category 3 Plan and states she is following her eating plan approximately 80% of the time. Alicia Payne states she is walking the dog for 20-30 minutes 7 times per week.  Today's visit was #: 15 Starting weight: 250 lbs Starting date: 05/10/2020 Today's weight: 218 lbs Today's date: 06/21/2021 Total lbs lost to date: 32 Total lbs lost since last in-office visit: 1  Interim History: Alicia Payne notes Ozempic has decreased her appetite, making it hard to eat all of her dinner protein but she declines change in dose due to an upcoming travel. She denies issues with the plan, but she is skipping food/meals.  Subjective:   1. Pre-diabetes Alicia Payne has a diagnosis of prediabetes based on her elevated HgA1c and was informed this puts her at greater risk of developing diabetes. She continues to work on diet and exercise to decrease her risk of diabetes. She denies nausea or hypoglycemia.  2. Vitamin D deficiency Alicia Payne is currently taking prescription vitamin D 50,000 IU each week. She denies nausea, vomiting or muscle weakness.  3. At risk for deficient intake of food Alicia Payne is at a higher than average risk of deficient intake of food due to inadequate intake.  Assessment/Plan:   1. Pre-diabetes Alicia Payne will continue to work on weight loss, exercise, and decreasing simple carbohydrates to help decrease the risk of diabetes. We will refill metformin and Ozempic for 1 month. Patient may need a refill prior to her next office visit.  - metFORMIN (GLUCOPHAGE) 500 MG tablet; Take 1 tablet (500 mg total) by mouth daily with lunch.  Dispense: 30 tablet; Refill: 0 - Semaglutide, 1 MG/DOSE, 4 MG/3ML SOPN; Inject 1 mg as directed once a week.  Dispense: 3 mL; Refill: 0  2. Vitamin D deficiency Low Vitamin D level  contributes to fatigue and are associated with obesity, breast, and colon cancer. We will refill prescription Vitamin D for 1 month. Alicia Payne will follow-up for routine testing of Vitamin D, at least 2-3 times per year to avoid over-replacement.  - Vitamin D, Ergocalciferol, (DRISDOL) 1.25 MG (50000 UNIT) CAPS capsule; Take 1 capsule (50,000 Units total) by mouth every 7 (seven) days.  Dispense: 4 capsule; Refill: 0  3. At risk for deficient intake of food Alicia Payne was given approximately 9 minutes of deficit intake of food prevention counseling today. Alicia Payne is at risk for eating too few calories based on current food recall. She was encouraged to focus on meeting caloric and protein goals according to her recommended meal plan.   4. Obesity with current BMI of 33.1 Alicia Payne is currently in the action stage of change. As such, her goal is to continue with weight loss efforts. She has agreed to the Category 3 Plan.   Alicia Payne is going to [place] on September 27th to October 11th. Her goal over the next several weeks is to not gain weight. We will recheck fasting IC at her next office visit.  Exercise goals: As is, and start with 20 minutes of walking by herself (without the dog) per day.  Behavioral modification strategies: increasing lean protein intake and no skipping meals.  Alicia Payne has agreed to follow-up with our clinic in 3 weeks. She was informed of the importance of frequent follow-up visits to maximize her success with intensive lifestyle modifications for her multiple health conditions.   Objective:  Blood pressure 109/70, pulse 72, temperature (!) 97.5 F (36.4 C), height 5\' 8"  (1.727 m), weight 218 lb (98.9 kg), last menstrual period 11/20/2014, SpO2 96 %. Body mass index is 33.15 kg/m.  General: Cooperative, alert, well developed, in no acute distress. HEENT: Conjunctivae and lids unremarkable. Cardiovascular: Regular rhythm.  Lungs: Normal work of breathing. Neurologic:  No focal deficits.   Lab Results  Component Value Date   CREATININE 0.81 10/04/2020   BUN 17 10/04/2020   NA 143 10/04/2020   K 4.7 10/04/2020   CL 106 10/04/2020   CO2 22 10/04/2020   Lab Results  Component Value Date   ALT 22 10/04/2020   AST 14 10/04/2020   ALKPHOS 73 10/04/2020   BILITOT 0.5 10/04/2020   Lab Results  Component Value Date   HGBA1C 5.8 (H) 04/20/2021   HGBA1C 5.9 (H) 10/04/2020   HGBA1C 6.0 (H) 05/10/2020   Lab Results  Component Value Date   INSULIN 9.9 04/20/2021   INSULIN 9.1 10/04/2020   INSULIN 16.9 05/10/2020   Lab Results  Component Value Date   TSH 1.220 05/10/2020   Lab Results  Component Value Date   CHOL 268 (H) 04/20/2021   HDL 52 04/20/2021   LDLCALC 194 (H) 04/20/2021   TRIG 120 04/20/2021   CHOLHDL 4.9 (H) 10/04/2020   Lab Results  Component Value Date   VD25OH 53.2 04/20/2021   VD25OH 45.8 10/04/2020   VD25OH 32.9 05/10/2020   Lab Results  Component Value Date   WBC 6.0 04/20/2021   HGB 15.7 04/20/2021   HCT 46.3 04/20/2021   MCV 91 04/20/2021   PLT 204 04/20/2021   No results found for: IRON, TIBC, FERRITIN  Attestation Statements:   Reviewed by clinician on day of visit: allergies, medications, problem list, medical history, surgical history, family history, social history, and previous encounter notes.   Wilhemena Durie, am acting as transcriptionist for [employer], DO.  I have reviewed the above documentation for accuracy and completeness, and I agree with the above. Marjory Sneddon, D.O.  The [place] was signed into law in 2016 which includes the topic of electronic health records.  This provides immediate access to information in MyChart.  This includes consultation notes, operative notes, office notes, lab results and pathology reports.  If you have any questions about what you read please let us know at your next visit so we can discuss your concerns and take corrective action if  need be.  We are right here with you.

## 2021-07-13 ENCOUNTER — Other Ambulatory Visit (HOSPITAL_COMMUNITY): Payer: Self-pay

## 2021-07-13 MED ORDER — AMOXICILLIN 500 MG PO CAPS
500.0000 mg | ORAL_CAPSULE | Freq: Three times a day (TID) | ORAL | 1 refills | Status: DC
  Filled 2021-07-13 – 2021-08-10 (×2): qty 30, 10d supply, fill #0

## 2021-07-17 ENCOUNTER — Other Ambulatory Visit (HOSPITAL_COMMUNITY): Payer: Self-pay

## 2021-07-17 MED ORDER — CLINDAMYCIN HCL 300 MG PO CAPS
300.0000 mg | ORAL_CAPSULE | Freq: Three times a day (TID) | ORAL | 0 refills | Status: DC
  Filled 2021-07-17: qty 30, 10d supply, fill #0

## 2021-07-18 ENCOUNTER — Other Ambulatory Visit (INDEPENDENT_AMBULATORY_CARE_PROVIDER_SITE_OTHER): Payer: Self-pay | Admitting: Family Medicine

## 2021-07-18 ENCOUNTER — Other Ambulatory Visit (HOSPITAL_COMMUNITY): Payer: Self-pay

## 2021-07-18 DIAGNOSIS — R7303 Prediabetes: Secondary | ICD-10-CM

## 2021-07-18 MED ORDER — OZEMPIC (1 MG/DOSE) 4 MG/3ML ~~LOC~~ SOPN
1.0000 mg | PEN_INJECTOR | SUBCUTANEOUS | 0 refills | Status: DC
  Filled 2021-07-18: qty 3, 28d supply, fill #0

## 2021-07-18 NOTE — Telephone Encounter (Signed)
LAST APPOINTMENT DATE: 06/21/21 NEXT APPOINTMENT DATE: 07/25/21   [facility] [address] [place] [address] Phone: [phone] Fax: [id number] - [place], [place] - [address] AT [place] [address] [place] [address] Phone: [phone] Fax: [phone]  Patient is requesting a refill of the following medications: Pending Prescriptions:                       Disp   Refills   Semaglutide, 1 MG/DOSE, (OZEMPIC, 1 MG/DOS*3 mL   0       Sig: Inject 1 mg as directed once a week.   Date last filled: 06/21/21 Previously prescribed by Dr.Opalski  Lab Results      Component                Value               Date                      HGBA1C                   5.8 (H)             04/20/2021                HGBA1C                   5.9 (H)             10/04/2020                HGBA1C                   6.0 (H)             05/10/2020           Lab Results      Component                Value               Date                      LDLCALC                  194 (H)             04/20/2021                CREATININE               0.81                10/04/2020           Lab Results      Component                Value               Date                      VD25OH                   53.2                04/20/2021                VD25OH  45.8                10/04/2020                VD25OH                   32.9                05/10/2020            BP Readings from Last 3 Encounters: 06/21/21 : 109/70 05/22/21 : 125/80 04/24/21 : 116/68

## 2021-07-25 ENCOUNTER — Ambulatory Visit (INDEPENDENT_AMBULATORY_CARE_PROVIDER_SITE_OTHER): Payer: No Typology Code available for payment source | Admitting: Family Medicine

## 2021-08-08 ENCOUNTER — Encounter (INDEPENDENT_AMBULATORY_CARE_PROVIDER_SITE_OTHER): Payer: Self-pay | Admitting: Family Medicine

## 2021-08-08 ENCOUNTER — Other Ambulatory Visit: Payer: Self-pay

## 2021-08-08 ENCOUNTER — Other Ambulatory Visit (HOSPITAL_COMMUNITY): Payer: Self-pay

## 2021-08-08 ENCOUNTER — Ambulatory Visit (INDEPENDENT_AMBULATORY_CARE_PROVIDER_SITE_OTHER): Payer: No Typology Code available for payment source | Admitting: Family Medicine

## 2021-08-08 VITALS — BP 112/72 | HR 74 | Temp 97.5°F | Ht 68.0 in | Wt 210.0 lb

## 2021-08-08 DIAGNOSIS — R7303 Prediabetes: Secondary | ICD-10-CM

## 2021-08-08 DIAGNOSIS — E7849 Other hyperlipidemia: Secondary | ICD-10-CM

## 2021-08-08 DIAGNOSIS — R0602 Shortness of breath: Secondary | ICD-10-CM

## 2021-08-08 DIAGNOSIS — Z9189 Other specified personal risk factors, not elsewhere classified: Secondary | ICD-10-CM

## 2021-08-08 DIAGNOSIS — Z6838 Body mass index (BMI) 38.0-38.9, adult: Secondary | ICD-10-CM

## 2021-08-08 DIAGNOSIS — E559 Vitamin D deficiency, unspecified: Secondary | ICD-10-CM

## 2021-08-08 MED ORDER — VITAMIN D (ERGOCALCIFEROL) 1.25 MG (50000 UNIT) PO CAPS
50000.0000 [IU] | ORAL_CAPSULE | ORAL | 0 refills | Status: DC
  Filled 2021-08-08: qty 4, 28d supply, fill #0

## 2021-08-08 MED ORDER — OZEMPIC (1 MG/DOSE) 4 MG/3ML ~~LOC~~ SOPN
1.0000 mg | PEN_INJECTOR | SUBCUTANEOUS | 0 refills | Status: DC
  Filled 2021-08-08: qty 3, 28d supply, fill #0

## 2021-08-08 NOTE — Progress Notes (Signed)
Chief Complaint:   OBESITY Alicia Payne is here to discuss her progress with her obesity treatment plan along with follow-up of her obesity related diagnoses. Alicia Payne is on the Category 3 Plan and states she is following her eating plan approximately 70% of the time. Alicia Payne states she is walking the dog for 30-40 minutes 7 times per week.  Today's visit was #: 84 Starting weight: 250 lbs Starting date: 05/10/2020 Today's weight: 210 lbs Today's date: 08/08/2021 Total lbs lost to date: 40 Total lbs lost since last in-office visit: 8  Interim History: Alicia Payne came back from [place] after being there for 2 weeks. She walked a lot around [place]. Her mom had radioactive exposure many years ago after the [place] disaster, and she ended up with thyroid cancer; not multiple endocrine neoplasia type I or II. Per the patient, it was environmental.   Subjective:   1. SOB (shortness of breath) on exertion Alicia Payne notes increasing shortness of breath with exercising and seems to be worsening over time with weight gain. She notes getting out of breath sooner with activity than she used to. This has not gotten worse recently. Alicia Payne denies shortness of breath at rest or orthopnea.  2. Pre-diabetes Alicia Payne is tolerating medication(s) well without side effects. Medication compliance is good and patient appears to be taking it as prescribed. She feels like Ozempic is working very well to control her hunger and cravings. Denies additional concerns regarding this condition.   3. Other hyperlipidemia Alicia Payne has hyperlipidemia and she has declined medications in the past. Her last LDL was elevated. She has been trying to improve her cholesterol levels with intensive lifestyle modification including a low saturated fat diet, exercise and weight loss. She denies any chest pain, claudication or myalgias.  4. Vitamin D deficiency Alicia Payne is currently taking prescription vitamin D  50,000 IU each week. She denies nausea, vomiting or muscle weakness.  5. At risk for deficient intake of food Alicia Payne is at a higher than average risk of deficient intake of food due to still having difficulty getting all of the food in.  Assessment/Plan:   Orders Placed This Encounter  Procedures   Comprehensive metabolic panel   Hemoglobin A1c   Insulin, random   Lipid panel   Magnesium   Vitamin B12   TSH   T4, free   VITAMIN D 25 Hydroxy (Vit-D Deficiency, Fractures)    Medications Discontinued During This Encounter  Medication Reason   rOPINIRole (REQUIP) 1 MG tablet Error   Vitamin D, Ergocalciferol, (DRISDOL) 1.25 MG (50000 UNIT) CAPS capsule Reorder   Semaglutide, 1 MG/DOSE, (OZEMPIC, 1 MG/DOSE,) 4 MG/3ML SOPN Reorder     Meds ordered this encounter  Medications   Semaglutide, 1 MG/DOSE, (OZEMPIC, 1 MG/DOSE,) 4 MG/3ML SOPN    Sig: Inject 1 mg as directed once a week.    Dispense:  3 mL    Refill:  0   Vitamin D, Ergocalciferol, (DRISDOL) 1.25 MG (50000 UNIT) CAPS capsule    Sig: Take 1 capsule (50,000 Units total) by mouth every 7 (seven) days.    Dispense:  4 capsule    Refill:  0     1. SOB (shortness of breath) on exertion We rechecked Alicia Payne's RMR today, and will decrease from the Category 3 plan to the Category 2 plan. We will check labs today, and will follow up at her next office visit.   - TSH - T4, free  2. Pre-diabetes We will check labs  today, and we will refill Ozempic at 1 mg dose for 1 month. Alicia Payne will continue metformin, and will continue to work on weight loss, exercise, and decreasing simple carbohydrates to help decrease the risk of diabetes.   - Semaglutide, 1 MG/DOSE, (OZEMPIC, 1 MG/DOSE,) 4 MG/3ML SOPN; Inject 1 mg as directed once a week.  Dispense: 3 mL; Refill: 0 - Hemoglobin A1c - Insulin, random - Magnesium - Vitamin B12 - TSH - T4, free  3. Other hyperlipidemia Cardiovascular risk and specific lipid/LDL goals reviewed.  We discussed several lifestyle modifications today. We will check labs today. Alicia Payne will continue to work on diet, exercise and weight loss efforts. Orders and follow up as documented in patient record.   Counseling Intensive lifestyle modifications are the first line treatment for this issue. Dietary changes: Increase soluble fiber. Decrease simple carbohydrates. Exercise changes: Moderate to vigorous-intensity aerobic activity 150 minutes per week if tolerated. Lipid-lowering medications: see documented in medical record.  - Comprehensive metabolic panel - Lipid panel  4. Vitamin D deficiency Low Vitamin D level contributes to fatigue and are associated with obesity, breast, and colon cancer. We will check labs today, and we will refill prescription Vitamin D 50,000 IU every week for 1 month. Alicia Payne will follow-up for routine testing of Vitamin D, at least 2-3 times per year to avoid over-replacement.  - Vitamin D 25 hydroxy (Vit-D Deficiency, Fractures) - Vitamin D, Ergocalciferol, (DRISDOL) 1.25 MG (50000 UNIT) CAPS capsule; Take 1 capsule (50,000 Units total) by mouth every 7 (seven) days.  Dispense: 4 capsule; Refill: 0  5. At risk for deficient intake of food Alicia Payne was given approximately 9 minutes of deficit intake of food prevention counseling today. Alicia Payne is at risk for eating too few calories based on current food recall. She was encouraged to focus on meeting caloric and protein goals according to her recommended meal plan.   6. Obesity BMI today is 23 Alicia Payne is currently in the action stage of change. As such, her goal is to continue with weight loss efforts. She has agreed to change to the Category 2 Plan.   Exercise goals: For substantial health benefits, adults should do at least 150 minutes (2 hours and 30 minutes) a week of moderate-intensity, or 75 minutes (1 hour and 15 minutes) a week of vigorous-intensity aerobic physical activity, or an equivalent  combination of moderate- and vigorous-intensity aerobic activity. Aerobic activity should be performed in episodes of at least 10 minutes, and preferably, it should be spread throughout the week.  Behavioral modification strategies: increasing lean protein intake, decreasing simple carbohydrates, and no skipping meals.  Alicia Payne has agreed to follow-up with our clinic in 2 to 3 weeks. She was informed of the importance of frequent follow-up visits to maximize her success with intensive lifestyle modifications for her multiple health conditions.   Alicia Payne was informed we would discuss her lab results at her next visit unless there is a critical issue that needs to be addressed sooner. Alicia Payne agreed to keep her next visit at the agreed upon time to discuss these results.  Objective:   Blood pressure 112/72, pulse 74, temperature (!) 97.5 F (36.4 C), height 5\' 8"  (1.727 m), weight 210 lb (95.3 kg), last menstrual period 11/20/2014, SpO2 98 %. Body mass index is 31.93 kg/m.  General: Cooperative, alert, well developed, in no acute distress. HEENT: Conjunctivae and lids unremarkable. Cardiovascular: Regular rhythm.  Lungs: Normal work of breathing. Neurologic: No focal deficits.   Lab Results  Component Value  Date   CREATININE 0.81 10/04/2020   BUN 17 10/04/2020   NA 143 10/04/2020   K 4.7 10/04/2020   CL 106 10/04/2020   CO2 22 10/04/2020   Lab Results  Component Value Date   ALT 22 10/04/2020   AST 14 10/04/2020   ALKPHOS 73 10/04/2020   BILITOT 0.5 10/04/2020   Lab Results  Component Value Date   HGBA1C 5.8 (H) 04/20/2021   HGBA1C 5.9 (H) 10/04/2020   HGBA1C 6.0 (H) 05/10/2020   Lab Results  Component Value Date   INSULIN 9.9 04/20/2021   INSULIN 9.1 10/04/2020   INSULIN 16.9 05/10/2020   Lab Results  Component Value Date   TSH 1.220 05/10/2020   Lab Results  Component Value Date   CHOL 268 (H) 04/20/2021   HDL 52 04/20/2021   LDLCALC 194 (H) 04/20/2021    TRIG 120 04/20/2021   CHOLHDL 4.9 (H) 10/04/2020   Lab Results  Component Value Date   VD25OH 53.2 04/20/2021   VD25OH 45.8 10/04/2020   VD25OH 32.9 05/10/2020   Lab Results  Component Value Date   WBC 6.0 04/20/2021   HGB 15.7 04/20/2021   HCT 46.3 04/20/2021   MCV 91 04/20/2021   PLT 204 04/20/2021   No results found for: IRON, TIBC, FERRITIN  Attestation Statements:   Reviewed by clinician on day of visit: allergies, medications, problem list, medical history, surgical history, family history, social history, and previous encounter notes.   Wilhemena Durie, am acting as transcriptionist for [employer], DO.  I have reviewed the above documentation for accuracy and completeness, and I agree with the above. Marjory Sneddon, D.O.  The [place] was signed into law in 2016 which includes the topic of electronic health records.  This provides immediate access to information in MyChart.  This includes consultation notes, operative notes, office notes, lab results and pathology reports.  If you have any questions about what you read please let us know at your next visit so we can discuss your concerns and take corrective action if need be.  We are right here with you.

## 2021-08-09 ENCOUNTER — Other Ambulatory Visit (HOSPITAL_COMMUNITY): Payer: Self-pay

## 2021-08-09 LAB — LIPID PANEL
Chol/HDL Ratio: 5.1 ratio — ABNORMAL HIGH (ref 0.0–4.4)
Cholesterol, Total: 255 mg/dL — ABNORMAL HIGH (ref 100–199)
HDL: 50 mg/dL (ref >39)
LDL Chol Calc (NIH): 157 mg/dL — ABNORMAL HIGH (ref 0–99)
Triglycerides: 261 mg/dL — ABNORMAL HIGH (ref 0–149)
VLDL Cholesterol Cal: 48 mg/dL — ABNORMAL HIGH (ref 5–40)

## 2021-08-09 LAB — COMPREHENSIVE METABOLIC PANEL
ALT: 24 IU/L (ref 0–32)
AST: 18 IU/L (ref 0–40)
Albumin/Globulin Ratio: 2.1 (ref 1.2–2.2)
Albumin: 4.8 g/dL (ref 3.8–4.9)
Alkaline Phosphatase: 65 IU/L (ref 44–121)
BUN/Creatinine Ratio: 16 (ref 9–23)
BUN: 13 mg/dL (ref 6–24)
Bilirubin Total: 0.6 mg/dL (ref 0.0–1.2)
CO2: 23 mmol/L (ref 20–29)
Calcium: 9.7 mg/dL (ref 8.7–10.2)
Chloride: 101 mmol/L (ref 96–106)
Creatinine, Ser: 0.82 mg/dL (ref 0.57–1.00)
Globulin, Total: 2.3 g/dL (ref 1.5–4.5)
Glucose: 82 mg/dL (ref 70–99)
Potassium: 4.5 mmol/L (ref 3.5–5.2)
Sodium: 139 mmol/L (ref 134–144)
Total Protein: 7.1 g/dL (ref 6.0–8.5)
eGFR: 84 mL/min/{1.73_m2} (ref >59)

## 2021-08-09 LAB — HEMOGLOBIN A1C
Est. average glucose Bld gHb Est-mCnc: 108 mg/dL
Hgb A1c MFr Bld: 5.4 % (ref 4.8–5.6)

## 2021-08-09 LAB — T4, FREE: Free T4: 1.11 ng/dL (ref 0.82–1.77)

## 2021-08-09 LAB — VITAMIN B12: Vitamin B-12: 1001 pg/mL (ref 232–1245)

## 2021-08-09 LAB — INSULIN, RANDOM: INSULIN: 12.3 u[IU]/mL (ref 2.6–24.9)

## 2021-08-09 LAB — TSH: TSH: 1.31 u[IU]/mL (ref 0.450–4.500)

## 2021-08-09 LAB — MAGNESIUM: Magnesium: 2 mg/dL (ref 1.6–2.3)

## 2021-08-10 ENCOUNTER — Other Ambulatory Visit (HOSPITAL_COMMUNITY): Payer: Self-pay

## 2021-08-28 ENCOUNTER — Ambulatory Visit (INDEPENDENT_AMBULATORY_CARE_PROVIDER_SITE_OTHER): Payer: No Typology Code available for payment source | Admitting: Family Medicine

## 2021-09-11 ENCOUNTER — Other Ambulatory Visit: Payer: Self-pay

## 2021-09-11 ENCOUNTER — Other Ambulatory Visit (HOSPITAL_COMMUNITY): Payer: Self-pay

## 2021-09-11 ENCOUNTER — Ambulatory Visit (INDEPENDENT_AMBULATORY_CARE_PROVIDER_SITE_OTHER): Payer: No Typology Code available for payment source | Admitting: Family Medicine

## 2021-09-11 ENCOUNTER — Encounter (INDEPENDENT_AMBULATORY_CARE_PROVIDER_SITE_OTHER): Payer: Self-pay | Admitting: Family Medicine

## 2021-09-11 VITALS — BP 109/71 | HR 71 | Temp 97.8°F | Ht 68.0 in | Wt 211.0 lb

## 2021-09-11 DIAGNOSIS — Z9189 Other specified personal risk factors, not elsewhere classified: Secondary | ICD-10-CM | POA: Diagnosis not present

## 2021-09-11 DIAGNOSIS — E559 Vitamin D deficiency, unspecified: Secondary | ICD-10-CM

## 2021-09-11 DIAGNOSIS — Z6838 Body mass index (BMI) 38.0-38.9, adult: Secondary | ICD-10-CM

## 2021-09-11 DIAGNOSIS — R7303 Prediabetes: Secondary | ICD-10-CM

## 2021-09-11 MED ORDER — METFORMIN HCL 500 MG PO TABS
500.0000 mg | ORAL_TABLET | Freq: Every day | ORAL | 0 refills | Status: DC
  Filled 2021-09-11: qty 30, 30d supply, fill #0

## 2021-09-11 MED ORDER — VITAMIN D (ERGOCALCIFEROL) 1.25 MG (50000 UNIT) PO CAPS
50000.0000 [IU] | ORAL_CAPSULE | ORAL | 0 refills | Status: DC
  Filled 2021-09-11: qty 4, 28d supply, fill #0

## 2021-09-11 MED ORDER — TIRZEPATIDE 5 MG/0.5ML ~~LOC~~ SOAJ
5.0000 mg | SUBCUTANEOUS | 0 refills | Status: DC
Start: 2021-09-11 — End: 2021-10-18
  Filled 2021-09-11: qty 2, 28d supply, fill #0

## 2021-09-12 ENCOUNTER — Other Ambulatory Visit (HOSPITAL_COMMUNITY): Payer: Self-pay

## 2021-09-12 NOTE — Progress Notes (Signed)
Chief Complaint:   OBESITY Alicia Payne is here to discuss her progress with her obesity treatment plan along with follow-up of her obesity related diagnoses. Alicia Payne is on the Category 2 Plan and states she is following her eating plan approximately 50% of the time. Alicia Payne states she is walking for 30 minutes 7 times per week.  Today's visit was #: 52 Starting weight: 250 lbs Starting date: 05/10/2020 Today's weight: 211 lbs Today's date: 09/11/2021 Total lbs lost to date: 39 Total lbs lost since last in-office visit: 0  Interim History: Alicia Payne is still having a lot of sweet cravings. She finds it difficult to stay on the plan and not go over with snack calories. Her stress is controlled for now, but this is the first Christmas without her husband.  Subjective:   1. Pre-diabetes Alicia Payne notes Ozempic is working well, but she still desires sugar/chocolate etc.  2. Vitamin D deficiency Alicia Payne is currently taking prescription vitamin D 50,000 IU each week. She denies nausea, vomiting or muscle weakness.  3. At risk for side effect of medication Alicia Payne is at risk for drug side effects due to changing therapy/medication today.  Assessment/Plan:  No orders of the defined types were placed in this encounter.   Medications Discontinued During This Encounter  Medication Reason   Semaglutide, 1 MG/DOSE, (OZEMPIC, 1 MG/DOSE,) 4 MG/3ML SOPN    metFORMIN (GLUCOPHAGE) 500 MG tablet Reorder   Vitamin D, Ergocalciferol, (DRISDOL) 1.25 MG (50000 UNIT) CAPS capsule Reorder     Meds ordered this encounter  Medications   Vitamin D, Ergocalciferol, (DRISDOL) 1.25 MG (50000 UNIT) CAPS capsule    Sig: Take 1 capsule (50,000 Units total) by mouth every 7 (seven) days.    Dispense:  4 capsule    Refill:  0   tirzepatide (MOUNJARO) 5 MG/0.5ML Pen    Sig: Inject 5 mg into the skin once a week.    Dispense:  2 mL    Refill:  0   metFORMIN (GLUCOPHAGE) 500 MG tablet    Sig: Take 1  tablet (500 mg total) by mouth daily with lunch.    Dispense:  30 tablet    Refill:  0     1. Pre-diabetes Alicia Payne agreed to discontinue Ozempic, and start Mounjaro 5 mg weekly with no refills; she is to restart metformin 500 mg q daily with lunch with no refills. She will continue to work on weight loss, exercise, and decreasing simple carbohydrates to help decrease the risk of diabetes.   - tirzepatide [facility]) 5 MG/0.5ML Pen; Inject 5 mg into the skin once a week.  Dispense: 2 mL; Refill: 0 - metFORMIN (GLUCOPHAGE) 500 MG tablet; Take 1 tablet (500 mg total) by mouth daily with lunch.  Dispense: 30 tablet; Refill: 0  2. Vitamin D deficiency We will refill prescription Vitamin D for 1 month. Alicia Payne will follow-up for routine testing of Vitamin D, at least 2-3 times per year to avoid over-replacement.  - Vitamin D, Ergocalciferol, (DRISDOL) 1.25 MG (50000 UNIT) CAPS capsule; Take 1 capsule (50,000 Units total) by mouth every 7 (seven) days.  Dispense: 4 capsule; Refill: 0  3. At risk for side effect of medication Alicia Payne was given approximately 8 minutes of drug side effect counseling today.  We discussed side effect possibility and risk versus benefits. Alicia Payne agreed to the medication and will contact this office if these side effects are intolerable.  Repetitive spaced learning was employed today to elicit superior memory formation and behavioral  change.  4. Obesity BMI today is 32.1 Alicia Payne is currently in the action stage of change. As such, her goal is to continue with weight loss efforts. She has agreed to the Category 2 Plan.   Exercise goals: All adults should avoid inactivity. Some physical activity is better than none, and adults who participate in any amount of physical activity gain some health benefits. (Beyond walking the dog).  Behavioral modification strategies: increasing lean protein intake, decreasing simple carbohydrates, and planning for success.  Alicia Payne  has agreed to follow-up with our clinic in 3 to 4 weeks. She was informed of the importance of frequent follow-up visits to maximize her success with intensive lifestyle modifications for her multiple health conditions.   Objective:   Blood pressure 109/71, pulse 71, temperature 97.8 F (36.6 C), height 5\' 8"  (1.727 m), weight 211 lb (95.7 kg), last menstrual period 11/20/2014, SpO2 97 %. Body mass index is 32.08 kg/m.  General: Cooperative, alert, well developed, in no acute distress. HEENT: Conjunctivae and lids unremarkable. Cardiovascular: Regular rhythm.  Lungs: Normal work of breathing. Neurologic: No focal deficits.   Lab Results  Component Value Date   CREATININE 0.82 08/08/2021   BUN 13 08/08/2021   NA 139 08/08/2021   K 4.5 08/08/2021   CL 101 08/08/2021   CO2 23 08/08/2021   Lab Results  Component Value Date   ALT 24 08/08/2021   AST 18 08/08/2021   ALKPHOS 65 08/08/2021   BILITOT 0.6 08/08/2021   Lab Results  Component Value Date   HGBA1C 5.4 08/08/2021   HGBA1C 5.8 (H) 04/20/2021   HGBA1C 5.9 (H) 10/04/2020   HGBA1C 6.0 (H) 05/10/2020   Lab Results  Component Value Date   INSULIN 12.3 08/08/2021   INSULIN 9.9 04/20/2021   INSULIN 9.1 10/04/2020   INSULIN 16.9 05/10/2020   Lab Results  Component Value Date   TSH 1.310 08/08/2021   Lab Results  Component Value Date   CHOL 255 (H) 08/08/2021   HDL 50 08/08/2021   LDLCALC 157 (H) 08/08/2021   TRIG 261 (H) 08/08/2021   CHOLHDL 5.1 (H) 08/08/2021   Lab Results  Component Value Date   VD25OH 53.2 04/20/2021   VD25OH 45.8 10/04/2020   VD25OH 32.9 05/10/2020   Lab Results  Component Value Date   WBC 6.0 04/20/2021   HGB 15.7 04/20/2021   HCT 46.3 04/20/2021   MCV 91 04/20/2021   PLT 204 04/20/2021   No results found for: IRON, TIBC, FERRITIN  Attestation Statements:   Reviewed by clinician on day of visit: allergies, medications, problem list, medical history, surgical history, family  history, social history, and previous encounter notes.   Wilhemena Durie, am acting as transcriptionist for [employer], DO.  I have reviewed the above documentation for accuracy and completeness, and I agree with the above. Marjory Sneddon, D.O.  The [place] was signed into law in 2016 which includes the topic of electronic health records.  This provides immediate access to information in MyChart.  This includes consultation notes, operative notes, office notes, lab results and pathology reports.  If you have any questions about what you read please let us know at your next visit so we can discuss your concerns and take corrective action if need be.  We are right here with you.

## 2021-10-04 ENCOUNTER — Other Ambulatory Visit: Payer: Self-pay | Admitting: Family

## 2021-10-04 DIAGNOSIS — Z1231 Encounter for screening mammogram for malignant neoplasm of breast: Secondary | ICD-10-CM

## 2021-10-09 ENCOUNTER — Ambulatory Visit (INDEPENDENT_AMBULATORY_CARE_PROVIDER_SITE_OTHER): Payer: No Typology Code available for payment source | Admitting: Family Medicine

## 2021-10-11 ENCOUNTER — Encounter: Payer: No Typology Code available for payment source | Admitting: Family

## 2021-10-18 ENCOUNTER — Encounter (INDEPENDENT_AMBULATORY_CARE_PROVIDER_SITE_OTHER): Payer: Self-pay | Admitting: Family Medicine

## 2021-10-18 ENCOUNTER — Other Ambulatory Visit: Payer: Self-pay

## 2021-10-18 ENCOUNTER — Ambulatory Visit (INDEPENDENT_AMBULATORY_CARE_PROVIDER_SITE_OTHER): Payer: No Typology Code available for payment source | Admitting: Family Medicine

## 2021-10-18 ENCOUNTER — Other Ambulatory Visit (HOSPITAL_COMMUNITY): Payer: Self-pay

## 2021-10-18 VITALS — BP 113/74 | HR 70 | Temp 98.0°F | Ht 68.0 in | Wt 206.0 lb

## 2021-10-18 DIAGNOSIS — E669 Obesity, unspecified: Secondary | ICD-10-CM | POA: Diagnosis not present

## 2021-10-18 DIAGNOSIS — Z6838 Body mass index (BMI) 38.0-38.9, adult: Secondary | ICD-10-CM

## 2021-10-18 DIAGNOSIS — Z6831 Body mass index (BMI) 31.0-31.9, adult: Secondary | ICD-10-CM

## 2021-10-18 DIAGNOSIS — R7303 Prediabetes: Secondary | ICD-10-CM | POA: Diagnosis not present

## 2021-10-18 DIAGNOSIS — E559 Vitamin D deficiency, unspecified: Secondary | ICD-10-CM | POA: Diagnosis not present

## 2021-10-18 DIAGNOSIS — Z9189 Other specified personal risk factors, not elsewhere classified: Secondary | ICD-10-CM

## 2021-10-18 MED ORDER — TIRZEPATIDE 7.5 MG/0.5ML ~~LOC~~ SOAJ
7.5000 mg | SUBCUTANEOUS | 0 refills | Status: DC
  Filled 2021-10-18: qty 2, 28d supply, fill #0

## 2021-10-18 MED ORDER — METFORMIN HCL 500 MG PO TABS
500.0000 mg | ORAL_TABLET | Freq: Every day | ORAL | 0 refills | Status: DC
  Filled 2021-10-18: qty 30, 30d supply, fill #0

## 2021-10-18 MED ORDER — VITAMIN D (ERGOCALCIFEROL) 1.25 MG (50000 UNIT) PO CAPS
50000.0000 [IU] | ORAL_CAPSULE | ORAL | 0 refills | Status: DC
  Filled 2021-10-18: qty 4, 28d supply, fill #0

## 2021-10-18 NOTE — Patient Instructions (Signed)
The 10-year ASCVD risk score (Arnett DK, et al., 2019) is: 2.4%   Values used to calculate the score:     Age: 56 years     Sex: Female     Is Non-Hispanic African American: No     Diabetic: No     Tobacco smoker: No     Systolic Blood Pressure: 008 mmHg     Is BP treated: No     HDL Cholesterol: 50 mg/dL     Total Cholesterol: 255 mg/dL

## 2021-10-18 NOTE — Progress Notes (Signed)
Chief Complaint:   OBESITY Alicia Payne is here to discuss her progress with her obesity treatment plan along with follow-up of her obesity related diagnoses. Alicia Payne is on the Category 2 Plan and states she is following her eating plan approximately 75% of the time. Alicia Payne states she is not currently exercising.  Today's visit was #: 18 Starting weight: 250 lbs Starting date: 05/10/2020 Today's weight: 206 lbs Today's date: 10/18/2021 Total lbs lost to date: 44 Total lbs lost since last in-office visit: 5  Interim History: We changed Alicia Payne's medications at her last office visit, and she is doing well. She is still having some emotional cravings. She is back on track and she did great with her 5 lb weight loss. She has no issues with the plan.   Subjective:   1. Pre-diabetes Alicia Payne is tolerating the [facility] well, that we started her on at her last office visit. She denies side effects, but she can't tell much of a difference. She is taking metformin as well now most days.  2. Vitamin D deficiency Alicia Payne is currently taking prescription vitamin D 50,000 IU each week. She denies nausea, vomiting or muscle weakness.  3. At risk for side effect of medication Alicia Payne is at risk for drug side effects due to increased dose of medication today.  Assessment/Plan:  No orders of the defined types were placed in this encounter.   Medications Discontinued During This Encounter  Medication Reason   amoxicillin (AMOXIL) 500 MG capsule    tirzepatide (MOUNJARO) 5 MG/0.5ML Pen    Vitamin D, Ergocalciferol, (DRISDOL) 1.25 MG (50000 UNIT) CAPS capsule Reorder   metFORMIN (GLUCOPHAGE) 500 MG tablet Reorder     Meds ordered this encounter  Medications   metFORMIN (GLUCOPHAGE) 500 MG tablet    Sig: Take 1 tablet (500 mg total) by mouth daily with lunch.    Dispense:  30 tablet    Refill:  0   Vitamin D, Ergocalciferol, (DRISDOL) 1.25 MG (50000 UNIT) CAPS capsule    Sig: Take 1  capsule (50,000 Units total) by mouth every 7 (seven) days.    Dispense:  4 capsule    Refill:  0   tirzepatide (MOUNJARO) 7.5 MG/0.5ML Pen    Sig: Inject 7.5 mg into the skin once a week.    Dispense:  2 mL    Refill:  0     1. Pre-diabetes Alicia Payne agreed to increase Mounjaro to 7.5 mg weekly, and we will refill metformin for 1 month. She will continue to work on weight loss, exercise, and decreasing simple carbohydrates to help decrease the risk of diabetes.   - metFORMIN (GLUCOPHAGE) 500 MG tablet; Take 1 tablet (500 mg total) by mouth daily with lunch.  Dispense: 30 tablet; Refill: 0 - tirzepatide (MOUNJARO) 7.5 MG/0.5ML Pen; Inject 7.5 mg into the skin once a week.  Dispense: 2 mL; Refill: 0  2. Vitamin D deficiency Low Vitamin D level contributes to fatigue and are associated with obesity, breast, and colon cancer. We will refill prescription Vitamin D for 1 month. Alicia Payne will follow-up for routine testing of Vitamin D, at least 2-3 times per year to avoid over-replacement.  - Vitamin D, Ergocalciferol, (DRISDOL) 1.25 MG (50000 UNIT) CAPS capsule; Take 1 capsule (50,000 Units total) by mouth every 7 (seven) days.  Dispense: 4 capsule; Refill: 0  3. At risk for side effect of medication Due to Alicia Payne's current conditions and medications, she is at a higher risk for drug side effect.  At least 9 minutes was spent on counseling her about these concerns today.  We discussed the benefits and potential risks of these medications, and all of patient's concerns were addressed and questions were answered.  she will call us, or their PCP or other specialists who treat their conditions with medications, with any questions or concerns that may develop.    4. Obesity with current BMI of 31.3 Alicia Payne is currently in the action stage of change. As such, her goal is to continue with weight loss efforts. She has agreed to the Category 2 Plan.   Alicia Payne is to focus on measuring her protein and  get them all in to help with cravings.  Exercise goals: As is, walking the dog but she was encouraged to do additional exercise for herself.  Behavioral modification strategies: increasing lean protein intake, better snacking choices, and avoiding temptations.  Alicia Payne has agreed to follow-up with our clinic in 4 weeks. She was informed of the importance of frequent follow-up visits to maximize her success with intensive lifestyle modifications for her multiple health conditions.   Objective:   Blood pressure 113/74, pulse 70, temperature 98 F (36.7 C), height 5\' 8"  (1.727 m), weight 206 lb (93.4 kg), last menstrual period 11/20/2014, SpO2 98 %. Body mass index is 31.32 kg/m.  General: Cooperative, alert, well developed, in no acute distress. HEENT: Conjunctivae and lids unremarkable. Cardiovascular: Regular rhythm.  Lungs: Normal work of breathing. Neurologic: No focal deficits.   Lab Results  Component Value Date   CREATININE 0.82 08/08/2021   BUN 13 08/08/2021   NA 139 08/08/2021   K 4.5 08/08/2021   CL 101 08/08/2021   CO2 23 08/08/2021   Lab Results  Component Value Date   ALT 24 08/08/2021   AST 18 08/08/2021   ALKPHOS 65 08/08/2021   BILITOT 0.6 08/08/2021   Lab Results  Component Value Date   HGBA1C 5.4 08/08/2021   HGBA1C 5.8 (H) 04/20/2021   HGBA1C 5.9 (H) 10/04/2020   HGBA1C 6.0 (H) 05/10/2020   Lab Results  Component Value Date   INSULIN 12.3 08/08/2021   INSULIN 9.9 04/20/2021   INSULIN 9.1 10/04/2020   INSULIN 16.9 05/10/2020   Lab Results  Component Value Date   TSH 1.310 08/08/2021   Lab Results  Component Value Date   CHOL 255 (H) 08/08/2021   HDL 50 08/08/2021   LDLCALC 157 (H) 08/08/2021   TRIG 261 (H) 08/08/2021   CHOLHDL 5.1 (H) 08/08/2021   Lab Results  Component Value Date   VD25OH 53.2 04/20/2021   VD25OH 45.8 10/04/2020   VD25OH 32.9 05/10/2020   Lab Results  Component Value Date   WBC 6.0 04/20/2021   HGB 15.7  04/20/2021   HCT 46.3 04/20/2021   MCV 91 04/20/2021   PLT 204 04/20/2021   No results found for: IRON, TIBC, FERRITIN  Attestation Statements:   Reviewed by clinician on day of visit: allergies, medications, problem list, medical history, surgical history, family history, social history, and previous encounter notes.   Wilhemena Durie, am acting as transcriptionist for [employer], DO.  I have reviewed the above documentation for accuracy and completeness, and I agree with the above. Marjory Sneddon, D.O.  The [place] was signed into law in 2016 which includes the topic of electronic health records.  This provides immediate access to information in MyChart.  This includes consultation notes, operative notes, office notes, lab results and pathology reports.  If you have any questions about what you read please let us know at your next visit so we can discuss your concerns and take corrective action if need be.  We are right here with you.

## 2021-10-24 ENCOUNTER — Ambulatory Visit
Admission: RE | Admit: 2021-10-24 | Discharge: 2021-10-24 | Disposition: A | Discharge status: Home / Self Care | Payer: No Typology Code available for payment source | Source: Ambulatory Visit | Attending: Family | Admitting: Family

## 2021-10-24 ENCOUNTER — Other Ambulatory Visit: Payer: Self-pay

## 2021-10-24 DIAGNOSIS — Z1231 Encounter for screening mammogram for malignant neoplasm of breast: Secondary | ICD-10-CM

## 2021-11-14 ENCOUNTER — Ambulatory Visit (INDEPENDENT_AMBULATORY_CARE_PROVIDER_SITE_OTHER): Payer: No Typology Code available for payment source | Admitting: Family

## 2021-11-14 ENCOUNTER — Encounter: Payer: Self-pay | Admitting: Family

## 2021-11-14 VITALS — BP 112/67 | HR 72 | Temp 97.7°F | Resp 16 | Ht 67.0 in | Wt 214.0 lb

## 2021-11-14 DIAGNOSIS — Z0000 Encounter for general adult medical examination without abnormal findings: Secondary | ICD-10-CM | POA: Insufficient documentation

## 2021-11-14 DIAGNOSIS — I8393 Asymptomatic varicose veins of bilateral lower extremities: Secondary | ICD-10-CM

## 2021-11-14 DIAGNOSIS — Z808 Family history of malignant neoplasm of other organs or systems: Secondary | ICD-10-CM | POA: Diagnosis not present

## 2021-11-14 NOTE — Progress Notes (Signed)
Subjective:     Patient ID: Alicia Payne, female    DOB: [birth year], 56 y.o.   MRN: [id number]  Chief Complaint  Patient presents with   Annual Exam    HPI  Patient presents today for complete physical.  Immunizations: She has had 3 covid vaccines.   Diet: She is working with healthy weight and wellness   She has lost 44 pounds.  C/o worsening bilateral varicose veins Did not follow through with derm referral.    Wt Readings from Last 3 Encounters:  11/14/21 214 lb (97.1 kg)  10/18/21 206 lb (93.4 kg)  09/11/21 211 lb (95.7 kg)  Exercise:  not regularly Colonoscopy: 2022 Pap Smear:  2021 Mammogram: 1/23 Vision: up to date Dental: up to date  Health Maintenance Due  Topic Date Due   Hepatitis C Screening  Never done   COVID-19 Vaccine (3 - Booster for [place] series) 12/07/2019    Past Medical History:  Diagnosis Date   Arthritis    Cyst of thyroid determined by ultrasound 03/10/2018   Hot flashes    Hyperlipidemia    Joint pain    Knee pain    left knee   Obesity    Phlebitis and thrombophlebitis of superficial vessels of right lower extremity 03/20/2017   Rosacea    Trouble in sleeping    Varicose vein of leg    Vitamin D deficiency     Past Surgical History:  Procedure Laterality Date   TUBAL LIGATION     UTERINE FIBROID SURGERY     VARICOSE VEIN SURGERY      Family History  Problem Relation Age of Onset   Cancer Mother        thyroid   Thyroid cancer Mother 12   Thyroid nodules Sister        thyroidectomy   Melanoma Sister        stage 4   Colon polyps Neg Hx    Colon cancer Neg Hx    Esophageal cancer Neg Hx    Stomach cancer Neg Hx    Rectal cancer Neg Hx     Social History   Socioeconomic History   Marital status: Married    Spouse name: Calla Kicks   Number of children: 2   Years of education: Not on file   Highest education level: Not on file  Occupational History   Occupation: Catering manager: [employer]  Tobacco Use   Smoking status: Former    Packs/day: 0.30    Types: Cigarettes    Start date: 1984    Quit date: 1998    Years since quitting: 25.1   Smokeless tobacco: Never  Vaping Use   Vaping Use: Never used  Substance and Sexual Activity   Alcohol use: Yes    Comment: social drinker   Drug use: No   Sexual activity: Not Currently    Partners: Male  Other Topics Concern   Not on file  Social History Narrative   2 daughters- adult (older one in [place] and younger is in [place], [place])   Facilities manager for cardiology   Married (second marriage)   [place]orge up in [place]- moved to [place] at age age 14    Enjoys reading, movies, house projects/gardening, spending time with family   Dog   Husband has stage 4 bladder cancer diagnosed 2019- has urost   Social Determinants of Radio broadcast assistant Strain: Not on [employer]  Insecurity: Not on file  Transportation Needs: Not on file  Physical Activity: Not on file  Stress: Not on file  Social Connections: Not on file  Intimate Partner Violence: Not on file    Outpatient Medications Prior to Visit  Medication Sig Dispense Refill   AMBULATORY NON FORMULARY MEDICATION Knee-high, medium compression, graduated compression stockings. Apply to lower extremities. [email or web address], Zippered Compression Stockings, large circ, long length 1 each 0   Ascorbic Acid (VITAMIN C) 1000 MG tablet Take 1,000 mg by mouth daily.     Bioflavonoid Products (BIOFLEX) TABS Take 1 tablet by mouth daily.     Black Cohosh 160 MG CAPS Take by mouth.     Calcium Citrate-Vitamin D (CALCIUM + D PO) Take 1 tablet by mouth daily.     clindamycin (CLEOCIN) 300 MG capsule Take 1 capsule (300 mg total) by mouth 3 (three) times daily until gone 30 capsule 0   diclofenac sodium (VOLTAREN) 1 % GEL Apply 4 g topically 4 (four) times daily. To affected joint. 100 g 11   metFORMIN (GLUCOPHAGE) 500 MG tablet Take 1 tablet (500 mg total) by mouth  daily with lunch. 30 tablet 0   Omega-3 Fatty Acids (FISH OIL PO) Take 1 capsule by mouth daily.     tirzepatide (MOUNJARO) 7.5 MG/0.5ML Pen Inject 7.5 mg into the skin once a week. 2 mL 0   Vitamin D, Ergocalciferol, (DRISDOL) 1.25 MG (50000 UNIT) CAPS capsule Take 1 capsule (50,000 Units total) by mouth every 7 (seven) days. 4 capsule 0   No facility-administered medications prior to visit.    No Known Allergies  Review of Systems  Constitutional:  Positive for weight loss (intentional).  HENT:  Negative for congestion.   Eyes:  Negative for blurred vision.  Respiratory:  Negative for cough.   Cardiovascular:  Negative for leg swelling.  Gastrointestinal:  Negative for constipation and diarrhea.  Genitourinary:  Negative for dysuria and frequency.  Musculoskeletal:  Negative for joint pain and myalgias.  Skin: Negative.   Neurological:  Negative for headaches.  Psychiatric/Behavioral:         Denies depression/anxiety      Objective:    Physical Exam  BP 112/67 (BP Location: Right Arm, Patient Position: Sitting, Cuff Size: Large)    Pulse 72    Temp 97.7 F (36.5 C) (Oral)    Resp 16    Ht 5\' 7"  (1.702 m)    Wt 214 lb (97.1 kg)    LMP 11/20/2014    SpO2 100%    BMI 33.52 kg/m  Wt Readings from Last 3 Encounters:  11/14/21 214 lb (97.1 kg)  10/18/21 206 lb (93.4 kg)  09/11/21 211 lb (95.7 kg)   Physical Exam  Constitutional: She is oriented to person, place, and time. She appears well-developed and well-nourished. No distress.  HENT:  Head: Normocephalic and atraumatic.  Right Ear: Tympanic membrane and ear canal normal.  Left Ear: Tympanic membrane and ear canal normal.  Mouth/Throat: not examined Eyes: Pupils are equal, round, and reactive to light. No scleral icterus.  Neck: Normal range of motion. No thyromegaly present.  Cardiovascular: Normal rate and regular rhythm.   No murmur heard. Bilateral LE varicose veins Pulmonary/Chest: Effort normal and breath sounds  normal. No respiratory distress. He has no wheezes. She has no rales. She exhibits no tenderness.  Abdominal: Soft. Bowel sounds are normal. She exhibits no distension and no mass. There is no tenderness. There is no rebound and no guarding.  Musculoskeletal: She exhibits no edema.  Lymphadenopathy:    She has no cervical adenopathy.  Neurological: She is alert and oriented to person, place, and time. She has normal patellar reflexes. She exhibits normal muscle tone. Coordination normal.  Skin: Skin is warm and dry.  Psychiatric: She has a normal mood and affect. Her behavior is normal. Judgment and thought content normal.  Breast/pelvis: deferred      Assessment & Plan:       Assessment & Plan:   Problem List Items Addressed This Visit       Unprioritized   Varicose veins of both lower extremities   Relevant Orders   Ambulatory referral to Vascular Surgery   Preventative health care - Primary    Encouraged her to add regular exercise. Continue weight loss efforts. Recommended covid bivalent booster.       Family history of melanoma   Relevant Orders   Ambulatory referral to Dermatology    I am having Irish Elders maintain her Omega-3 Fatty Acids (FISH OIL PO), Calcium Citrate-Vitamin D (CALCIUM + D PO), AMBULATORY NON FORMULARY MEDICATION, Black Cohosh, diclofenac sodium, Bioflex, vitamin C, clindamycin, metFORMIN, Vitamin D (Ergocalciferol), and tirzepatide.  No orders of the defined types were placed in this encounter.

## 2021-11-14 NOTE — Assessment & Plan Note (Signed)
Encouraged her to add regular exercise. Continue weight loss efforts. Recommended covid bivalent booster.

## 2021-11-23 ENCOUNTER — Encounter (INDEPENDENT_AMBULATORY_CARE_PROVIDER_SITE_OTHER): Payer: Self-pay | Admitting: Family Medicine

## 2021-11-23 ENCOUNTER — Other Ambulatory Visit (HOSPITAL_COMMUNITY): Payer: Self-pay

## 2021-11-23 ENCOUNTER — Other Ambulatory Visit: Payer: Self-pay

## 2021-11-23 ENCOUNTER — Ambulatory Visit (INDEPENDENT_AMBULATORY_CARE_PROVIDER_SITE_OTHER): Payer: No Typology Code available for payment source | Admitting: Family Medicine

## 2021-11-23 VITALS — BP 109/72 | HR 83 | Temp 97.5°F | Ht 67.0 in | Wt 210.0 lb

## 2021-11-23 DIAGNOSIS — E559 Vitamin D deficiency, unspecified: Secondary | ICD-10-CM

## 2021-11-23 DIAGNOSIS — E669 Obesity, unspecified: Secondary | ICD-10-CM | POA: Diagnosis not present

## 2021-11-23 DIAGNOSIS — E7849 Other hyperlipidemia: Secondary | ICD-10-CM | POA: Diagnosis not present

## 2021-11-23 DIAGNOSIS — Z9189 Other specified personal risk factors, not elsewhere classified: Secondary | ICD-10-CM

## 2021-11-23 DIAGNOSIS — Z6833 Body mass index (BMI) 33.0-33.9, adult: Secondary | ICD-10-CM

## 2021-11-23 DIAGNOSIS — R7303 Prediabetes: Secondary | ICD-10-CM | POA: Diagnosis not present

## 2021-11-23 DIAGNOSIS — Z6838 Body mass index (BMI) 38.0-38.9, adult: Secondary | ICD-10-CM

## 2021-11-23 MED ORDER — VITAMIN D (ERGOCALCIFEROL) 1.25 MG (50000 UNIT) PO CAPS
50000.0000 [IU] | ORAL_CAPSULE | ORAL | 0 refills | Status: DC
  Filled 2021-11-23: qty 4, 28d supply, fill #0

## 2021-11-23 MED ORDER — TIRZEPATIDE 10 MG/0.5ML ~~LOC~~ SOAJ
10.0000 mg | SUBCUTANEOUS | 0 refills | Status: DC
  Filled 2021-11-23: qty 2, 28d supply, fill #0

## 2021-11-23 MED ORDER — METFORMIN HCL 500 MG PO TABS
500.0000 mg | ORAL_TABLET | Freq: Every day | ORAL | 0 refills | Status: DC
  Filled 2021-11-23: qty 30, 30d supply, fill #0

## 2021-11-23 NOTE — Patient Instructions (Signed)
The 10-year ASCVD risk score (Arnett DK, et al., 2019) is: 2.2%   Values used to calculate the score:     Age: 56 years     Sex: Female     Is Non-Hispanic African American: No     Diabetic: No     Tobacco smoker: No     Systolic Blood Pressure: 706 mmHg     Is BP treated: No     HDL Cholesterol: 50 mg/dL     Total Cholesterol: 255 mg/dL

## 2021-11-23 NOTE — Progress Notes (Signed)
Chief Complaint:   OBESITY Lilyanna is here to discuss her progress with her obesity treatment plan along with follow-up of her obesity related diagnoses. Larua is on the Category 2 Plan and states she is following her eating plan approximately 70% of the time. Artisha states she is walking the dog for 30 minutes 7 times per week.  Today's visit was #: 33 Starting weight: 250 lbs Starting date: 05/10/2020 Today's weight: 210 lbs Today's date: 11/23/2021 Total lbs lost to date: 40 Total lbs lost since last in-office visit: 0  Interim History: Marleigh notes increased craving despite increased Mounjaro dose last office visit. More snacking after work and dinner is usually not on the plan. Skipping proteins and endorses she is not measuring foods/ proteins.  No issues with plan.   Subjective:   1. Pre-diabetes Ximena reports no difference with cravings or hunger with increased dose of Mounjaro last OV.   Last A1c was 5.4 on 08/08/2021.  2. Vitamin D deficiency Kaylee is currently taking prescription vitamin D 50,000 IU each week. She denies nausea, vomiting or muscle weakness.  3. Other hyperlipidemia Ia has hyperlipidemia and has been trying to improve her cholesterol levels with intensive lifestyle modifications including a low saturated fat diet, exercise, and weight loss. She denies any chest pain, claudication, or myalgias.   4. At risk for diabetes mellitus Tinleigh is at higher than average risk for developing diabetes due to her obesity and pre-dm.   Assessment/Plan:   Orders Placed This Encounter  Procedures   Hemoglobin A1c   Lipid panel   LDL cholesterol, direct   VITAMIN D 25 Hydroxy (Vit-D Deficiency, Fractures)    Medications Discontinued During This Encounter  Medication Reason   tirzepatide (MOUNJARO) 7.5 MG/0.5ML Pen Dose change   Vitamin D, Ergocalciferol, (DRISDOL) 1.25 MG (50000 UNIT) CAPS capsule Reorder   metFORMIN (GLUCOPHAGE) 500 MG  tablet Reorder     Meds ordered this encounter  Medications   tirzepatide (MOUNJARO) 10 MG/0.5ML Pen    Sig: Inject 10 mg into the skin once a week.    Dispense:  2 mL    Refill:  0   Vitamin D, Ergocalciferol, (DRISDOL) 1.25 MG (50000 UNIT) CAPS capsule    Sig: Take 1 capsule (50,000 Units total) by mouth every 7 (seven) days.    Dispense:  4 capsule    Refill:  0   metFORMIN (GLUCOPHAGE) 500 MG tablet    Sig: Take 1 tablet (500 mg total) by mouth daily with lunch.    Dispense:  30 tablet    Refill:  0     1. Pre-diabetes We will check labs today -we will refill metformin for 1 month.  [name] agreed to increase Mounjaro to 10 mg once weekly with no refills. - Reminded pt of possible S-E with increasing dose -  She will continue to work on weight loss, exercise, and decreasing simple carbohydrates to help decrease the risk of diabetes.   - tirzepatide (MOUNJARO) 10 MG/0.5ML Pen; Inject 10 mg into the skin once a week.  Dispense: 2 mL; Refill: 0 - metFORMIN (GLUCOPHAGE) 500 MG tablet; Take 1 tablet (500 mg total) by mouth daily with lunch.  Dispense: 30 tablet; Refill: 0 - Hemoglobin A1c   2. Vitamin D deficiency We will check labs today.  - We will refill prescription Vitamin D for 1 month. Samhitha will follow-up for routine testing of Vitamin D, at least 2-3 times per year to avoid over-replacement.  -  Vitamin D, Ergocalciferol, (DRISDOL) 1.25 MG (50000 UNIT) CAPS capsule; Take 1 capsule (50,000 Units total) by mouth every 7 (seven) days.  Dispense: 4 capsule; Refill: 0   3. Other hyperlipidemia Cardiovascular risk and specific lipid/LDL goals reviewed. We discussed several lifestyle modifications today.  - We will check labs today.  Tyaisha Cullom will continue to work on diet, exercise and weight loss efforts. Orders and follow up as documented in patient record.   Counseling Intensive lifestyle modifications are the first line treatment for this issue. Dietary  changes: Increase soluble fiber. Decrease simple carbohydrates. Exercise changes: Moderate to vigorous-intensity aerobic activity 150 minutes per week if tolerated. Lipid-lowering medications: see documented in medical record.  - Lipid panel - LDL cholesterol, direct   4. At risk for diabetes mellitus - Winnifred was given diabetes prevention education and counseling today of more than >23 minutes.  - Counseled patient on pathophysiology of disease and meaning/ implication of lab results.  - Reviewed how certain foods can either stimulate or inhibit insulin release, and subsequently affect hunger pathways  - Importance of following a healthy meal plan with limiting amounts of simple carbohydrates discussed with patient - Effects of regular aerobic exercise on blood sugar regulation reviewed and encouraged an eventual goal of 30 min 5d/week or more as a minimum.  - Briefly discussed treatment options, which always include dietary and lifestyle modification as first line.   - Handouts provided at patient's desire and/or told to go online to the American Diabetes Association website for further information.   5. Obesity with current BMI of 33 Kiara is currently in the action stage of change. As such, her goal is to continue with weight loss efforts. She has agreed to the Category 2 Plan and keeping a food journal and adhering to recommended goals of 1200-1400 calories and 90+ grams of protein daily.   Bring in journaling log.  Exercise goals: As is, increase as tolerated.  Behavioral modification strategies: keeping a strict food journal.  Geneieve has agreed to follow-up with our clinic in 4 weeks. She was informed of the importance of frequent follow-up visits to maximize her success with intensive lifestyle modifications for her multiple health conditions.   Adhya will f/up in 4 wks.  She was informed we would discuss her lab results at her next visit unless there is a critical  issue that needs to be addressed sooner. Lanora agreed to keep her next visit at the agreed upon time to discuss these results.   Objective:   Blood pressure 109/72, pulse 83, temperature (!) 97.5 F (36.4 C), height 5\' 7"  (1.702 m), weight 210 lb (95.3 kg), last menstrual period 11/20/2014, SpO2 98 %. Body mass index is 32.89 kg/m.  General: Cooperative, alert, well developed, in no acute distress. HEENT: Conjunctivae and lids unremarkable. Cardiovascular: Regular rhythm.  Lungs: Normal work of breathing. Neurologic: No focal deficits.   Lab Results  Component Value Date   CREATININE 0.82 08/08/2021   BUN 13 08/08/2021   NA 139 08/08/2021   K 4.5 08/08/2021   CL 101 08/08/2021   CO2 23 08/08/2021   Lab Results  Component Value Date   ALT 24 08/08/2021   AST 18 08/08/2021   ALKPHOS 65 08/08/2021   BILITOT 0.6 08/08/2021   Lab Results  Component Value Date   HGBA1C 5.4 08/08/2021   HGBA1C 5.8 (H) 04/20/2021   HGBA1C 5.9 (H) 10/04/2020   HGBA1C 6.0 (H) 05/10/2020   Lab Results  Component Value Date  INSULIN 12.3 08/08/2021   INSULIN 9.9 04/20/2021   INSULIN 9.1 10/04/2020   INSULIN 16.9 05/10/2020   Lab Results  Component Value Date   TSH 1.310 08/08/2021   Lab Results  Component Value Date   CHOL 255 (H) 08/08/2021   HDL 50 08/08/2021   LDLCALC 157 (H) 08/08/2021   TRIG 261 (H) 08/08/2021   CHOLHDL 5.1 (H) 08/08/2021   Lab Results  Component Value Date   VD25OH 53.2 04/20/2021   VD25OH 45.8 10/04/2020   VD25OH 32.9 05/10/2020   Lab Results  Component Value Date   WBC 6.0 04/20/2021   HGB 15.7 04/20/2021   HCT 46.3 04/20/2021   MCV 91 04/20/2021   PLT 204 04/20/2021   No results found for: IRON, TIBC, FERRITIN   Attestation Statements:   Reviewed by clinician on day of visit: allergies, medications, problem list, medical history, surgical history, family history, social history, and previous encounter notes.   Wilhemena Durie, am  acting as transcriptionist for [employer], DO.  I have reviewed the above documentation for accuracy and completeness, and I agree with the above. Marjory Sneddon, D.O.  The [place] was signed into law in 2016 which includes the topic of electronic health records.  This provides immediate access to information in MyChart.  This includes consultation notes, operative notes, office notes, lab results and pathology reports.  If you have any questions about what you read please let us know at your next visit so we can discuss your concerns and take corrective action if need be.  We are right here with you.

## 2021-11-24 LAB — LDL CHOLESTEROL, DIRECT: LDL Direct: 165 mg/dL — ABNORMAL HIGH (ref 0–99)

## 2021-11-24 LAB — LIPID PANEL
Chol/HDL Ratio: 5.7 ratio — ABNORMAL HIGH (ref 0.0–4.4)
Cholesterol, Total: 252 mg/dL — ABNORMAL HIGH (ref 100–199)
HDL: 44 mg/dL
LDL Chol Calc (NIH): 172 mg/dL — ABNORMAL HIGH (ref 0–99)
Triglycerides: 193 mg/dL — ABNORMAL HIGH (ref 0–149)
VLDL Cholesterol Cal: 36 mg/dL (ref 5–40)

## 2021-11-24 LAB — HEMOGLOBIN A1C
Est. average glucose Bld gHb Est-mCnc: 114 mg/dL
Hgb A1c MFr Bld: 5.6 % (ref 4.8–5.6)

## 2021-11-24 LAB — VITAMIN D 25 HYDROXY (VIT D DEFICIENCY, FRACTURES): Vit D, 25-Hydroxy: 57.5 ng/mL (ref 30.0–100.0)

## 2021-12-21 ENCOUNTER — Encounter (INDEPENDENT_AMBULATORY_CARE_PROVIDER_SITE_OTHER): Payer: Self-pay | Admitting: Family Medicine

## 2021-12-21 ENCOUNTER — Ambulatory Visit (INDEPENDENT_AMBULATORY_CARE_PROVIDER_SITE_OTHER): Payer: No Typology Code available for payment source | Admitting: Family Medicine

## 2021-12-21 ENCOUNTER — Other Ambulatory Visit (HOSPITAL_COMMUNITY): Payer: Self-pay

## 2021-12-21 ENCOUNTER — Other Ambulatory Visit: Payer: Self-pay

## 2021-12-21 VITALS — BP 118/75 | HR 71 | Temp 97.8°F | Ht 67.0 in | Wt 210.0 lb

## 2021-12-21 DIAGNOSIS — R7303 Prediabetes: Secondary | ICD-10-CM

## 2021-12-21 DIAGNOSIS — E559 Vitamin D deficiency, unspecified: Secondary | ICD-10-CM

## 2021-12-21 DIAGNOSIS — Z9189 Other specified personal risk factors, not elsewhere classified: Secondary | ICD-10-CM | POA: Diagnosis not present

## 2021-12-21 DIAGNOSIS — Z6832 Body mass index (BMI) 32.0-32.9, adult: Secondary | ICD-10-CM

## 2021-12-21 DIAGNOSIS — E669 Obesity, unspecified: Secondary | ICD-10-CM

## 2021-12-21 MED ORDER — METFORMIN HCL 500 MG PO TABS
500.0000 mg | ORAL_TABLET | Freq: Two times a day (BID) | ORAL | 0 refills | Status: DC
  Filled 2021-12-21: qty 60, 30d supply, fill #0

## 2021-12-21 MED ORDER — VITAMIN D (ERGOCALCIFEROL) 1.25 MG (50000 UNIT) PO CAPS
50000.0000 [IU] | ORAL_CAPSULE | ORAL | 0 refills | Status: DC
  Filled 2021-12-21: qty 4, 28d supply, fill #0

## 2021-12-21 MED ORDER — TIRZEPATIDE 12.5 MG/0.5ML ~~LOC~~ SOAJ
12.5000 mg | SUBCUTANEOUS | 0 refills | Status: DC
  Filled 2021-12-21: qty 2, 28d supply, fill #0

## 2021-12-22 NOTE — Progress Notes (Unsigned)
?Office: [phone]  /  Fax: [phone] ? ? ? ?Date: 01/01/2022   ?Appointment Start Time: *** ?Duration: *** minutes ?Provider: Glennie Isle, Psy.D. ?Type of Session: Intake for Individual Therapy  ?Location of Patient: {gbptloc:23249} ?Location of Provider: Provider's home (private office) ?Type of Contact: Telepsychological Visit via MyChart Video Visit ? ?Informed Consent: This provider called Alicia Payne at 3:05pm as she did not present for today's appointment. *** As such, today's appointment was initiated *** minutes late.Prior to proceeding with today's appointment, two pieces of identifying information were obtained. In addition, Latunya's physical location at the time of this appointment was obtained as well a phone number she could be reached at in the event of technical difficulties. Ardelia and this provider participated in today's telepsychological service.  ? ?The provider's role was explained to [facility]. The provider reviewed and discussed issues of confidentiality, privacy, and limits therein (e.g., reporting obligations). In addition to verbal informed consent, written informed consent for psychological services was obtained prior to the initial appointment. Since the clinic is not a 24/7 crisis center, mental health emergency resources were shared and this  provider explained MyChart, e-mail, voicemail, and/or other messaging systems should be utilized only for non-emergency reasons. This provider also explained that information obtained during appointments will be placed in Jessalynn's medical record and relevant information will be shared with other providers at [facility] for coordination of care. Nishat agreed information may be shared with other [facility] providers as needed for coordination of care and by signing the service agreement document, she provided written consent for coordination of care. Prior to initiating telepsychological  services, Mikenna completed an informed consent document, which included the development of a safety plan (i.e., an emergency contact and emergency resources) in the event of an emergency/crisis. Krystelle verbally acknowledged understanding she is ultimately responsible for understanding her insurance benefits for telepsychological and in-person services. This provider also reviewed confidentiality, as it relates to telepsychological services, as well as the rationale for telepsychological services (i.e., to reduce exposure risk to COVID-19). Mea  acknowledged understanding that appointments cannot be recorded without both party consent and she is aware she is responsible for securing confidentiality on her end of the session. Jara verbally consented to proceed. ? ?Chief Complaint/HPI: Gay was referred by Dr. Mellody Dance due to  "at risk for depression" . Per the note for the visit with Dr. Mellody Dance on 12/21/2021, "Paisely is at elevated risk of depression due to loss of husband less than 1 year ago."  ? ?During today's appointment, Toneshia was verbally administered a questionnaire assessing various behaviors related to emotional eating behaviors. Ciaira endorsed the following: {gbmoodandfood:21755}. She shared she craves ***. Vinetta believes the onset of emotional eating behaviors was *** and described the current frequency of emotional eating behaviors as ***. In addition, [name] {gblegal:22371} a history of binge eating behaviors. *** Currently, Nadege indicated *** triggers emotional eating behaviors, whereas *** makes emotional eating behaviors better. Furthermore, [name] {gblegal:22371} other problems of concern. ***  ? ?Mental Status Examination:  ?Appearance: {Appearance:22431} ?Behavior: {Behavior:22445} ?Mood: {gbmood:21757} ?Affect: {Affect:22436} ?Speech: {Speech:22432} ?Eye Contact: {Eye Contact:22433} ?Psychomotor Activity: {Motor Activity:22434} ?Gait: unable to  assess  ?Thought Process: {thought process:22448}  ?Thought Content/Perception: {disturbances:22451} ?Orientation: {Orientation:22437} ?Memory/Concentration: {gbcognition:22449} ?Insight/Judgment: {Insight:22446} ? ?Family & Psychosocial History: Carylon reported she is *** and ***. She indicated she is currently ***. Additionally, [name] shared her highest level of education obtained is ***. Currently, Shakiyla's social support system consists of her ***.  Moreover, Margarine stated she resides with her ***.  ? ?Medical History:  ?Past Medical History:  ?Diagnosis Date  ? Arthritis   ? Cyst of thyroid determined by ultrasound 03/10/2018  ? Hot flashes   ? Hyperlipidemia   ? Joint pain   ? Knee pain   ? left knee  ? Obesity   ? Phlebitis and thrombophlebitis of superficial vessels of right lower extremity 03/20/2017  ? Rosacea   ? Trouble in sleeping   ? Varicose vein of leg   ? Vitamin D deficiency   ? ?Past Surgical History:  ?Procedure Laterality Date  ? TUBAL LIGATION    ? UTERINE FIBROID SURGERY    ? VARICOSE VEIN SURGERY    ? ?Current Outpatient Medications on File Prior to Visit  ?Medication Sig Dispense Refill  ? AMBULATORY NON FORMULARY MEDICATION Knee-high, medium compression, graduated compression stockings. ?Apply to lower extremities. [email or web address], Zippered Compression Stockings, large circ, long length 1 each 0  ? Ascorbic Acid (VITAMIN C) 1000 MG tablet Take 1,000 mg by mouth daily.    ? Bioflavonoid Products (BIOFLEX) TABS Take 1 tablet by mouth daily.    ? Black Cohosh 160 MG CAPS Take by mouth.    ? Calcium Citrate-Vitamin D (CALCIUM + D PO) Take 1 tablet by mouth daily.    ? clindamycin (CLEOCIN) 300 MG capsule Take 1 capsule (300 mg total) by mouth 3 (three) times daily until gone 30 capsule 0  ? diclofenac sodium (VOLTAREN) 1 % GEL Apply 4 g topically 4 (four) times daily. To affected joint. 100 g 11  ? metFORMIN (GLUCOPHAGE) 500 MG tablet Take 1 tablet (500 mg total) by mouth 2 (two)  times daily with a meal. 60 tablet 0  ? Omega-3 Fatty Acids (FISH OIL PO) Take 1 capsule by mouth daily.    ? tirzepatide (MOUNJARO) 10 MG/0.5ML Pen Inject 10 mg into the skin once a week. 2 mL 0  ? tirzepatide (MOUNJARO) 12.5 MG/0.5ML Pen Inject 12.5 mg into the skin once a week. 6 mL 0  ? Vitamin D, Ergocalciferol, (DRISDOL) 1.25 MG (50000 UNIT) CAPS capsule Take 1 capsule (50,000 Units total) by mouth every 7 (seven) days. 4 capsule 0  ? ?No current facility-administered medications on file prior to visit.  ? ? ?Mental Health History: Alasha reported ***. She {gblegal:22371} a history of psychotropic medications. Disney {Endorse or deny of item:23407} hospitalizations for psychiatric concerns. Almer {gblegal:22371} a family history of mental health related concerns. *** Sabryna {Endorse or deny of item:23407} trauma including {gbtrauma:22071} abuse, as well as neglect. *** ? ?Fern described her typical mood lately as ***. Aside from concerns noted above and endorsed on the PHQ-9 and GAD-7, Saidee reported ***. Rosealee {gblegal:22371} current alcohol use. *** She {gblegal:22371} tobacco use. *** She {OXBDZHG:99242} illicit/recreational substance use. Regarding caffeine intake, Deneen reported ***. Furthermore, [name] indicated she is not experiencing the following: {gbsxs:21965}. She also denied history of and current suicidal ideation, plan, and intent; history of and current homicidal ideation, plan, and intent; and history of and current engagement in self-harm. ? ?The following strengths were reported by [name]: ***. The following strengths were observed by this provider: ability to express thoughts and feelings during the therapeutic session, ability to establish and benefit from a therapeutic relationship, willingness to work toward established goal(s) with the clinic and ability to engage in reciprocal conversation. *** ? ?Legal History: Janiqua {Endorse or deny of item:23407} legal  involvement.  ? ?Structured Assessments Results: The Patient Health  Questionnaire-9 (PHQ-9) is a self-report measure that assesses symptoms and severity of depression over the course of the last two weeks. Elzb

## 2022-01-01 ENCOUNTER — Telehealth (INDEPENDENT_AMBULATORY_CARE_PROVIDER_SITE_OTHER): Payer: Self-pay | Admitting: Psychology

## 2022-01-01 ENCOUNTER — Telehealth (INDEPENDENT_AMBULATORY_CARE_PROVIDER_SITE_OTHER): Payer: No Typology Code available for payment source | Admitting: Psychology

## 2022-01-01 NOTE — Progress Notes (Signed)
?Office: [phone]  /  Fax: [phone] ? ? ? ?Date: 01/15/2022   ?Appointment Start Time: 3:00pm ?Duration: 39 minutes ?Provider: Glennie Isle, Psy.D. ?Type of Session: Intake for Individual Therapy  ?Location of Patient: Parked in car (safe/private location)  ?Location of Provider: Provider's home (private office) ?Type of Contact: Telepsychological Visit via MyChart Video Visit ? ?Informed Consent: Prior to proceeding with today's appointment, two pieces of identifying information were obtained. In addition, Alicia Payne's physical location at the time of this appointment was obtained as well a phone number she could be reached at in the event of technical difficulties. Alicia Payne and this provider participated in today's telepsychological service.  ? ?The provider's role was explained to [facility]. The provider reviewed and discussed issues of confidentiality, privacy, and limits therein (e.g., reporting obligations). In addition to verbal informed consent, written informed consent for psychological services was obtained prior to the initial appointment. Since the clinic is not a 24/7 crisis center, mental health emergency resources were shared and this  provider explained MyChart, e-mail, voicemail, and/or other messaging systems should be utilized only for non-emergency reasons. This provider also explained that information obtained during appointments will be placed in Isys's medical record and relevant information will be shared with other providers at [facility] for coordination of care. Alicia Payne agreed information may be shared with other [facility] providers as needed for coordination of care and by signing the service agreement document, she provided written consent for coordination of care. Prior to initiating telepsychological services, Alicia Payne completed an informed consent document, which included the development of a safety plan (i.e., an emergency  contact and emergency resources) in the event of an emergency/crisis. Alicia Payne verbally acknowledged understanding she is ultimately responsible for understanding her insurance benefits for telepsychological and in-person services. This provider also reviewed confidentiality, as it relates to telepsychological services, as well as the rationale for telepsychological services (i.e., to reduce exposure risk to COVID-19). Alicia Payne  acknowledged understanding that appointments cannot be recorded without both party consent and she is aware she is responsible for securing confidentiality on her end of the session. Alicia Payne verbally consented to proceed. Of note, today's appointment was switched to a regular telephone call at 3:19pm with Alicia Payne's verbal consent due to her phone heating.  ? ?Chief Complaint/HPI: Alicia Payne was referred by Dr. Mellody Dance due to "at risk for depression". Per the note for the visit with Dr. Mellody Dance on 12/21/2021, "Alicia Payne is at elevated risk of depression due to loss of husband less than 1 year ago." The note further indicated, "Alicia Payne has a lot of emotional eating still.  Cravings - snacks a lot after dinner at 5:30 pm.  She hits her goals for calories and proteins most days.  Denies hunger." ? ?During today's appointment, Alicia Payne discussed a history of engagement in emotional eating behaviors. She explained there have not been any significant changes in her eating behaviors, adding she has lost ~40 pounds in the pat 1.5 years. She expressed desire to lose an additional 10 pounds. She was verbally administered a questionnaire assessing various behaviors related to emotional eating behaviors. Alicia Payne endorsed the following: overeat when you are celebrating, experience food cravings on a regular basis, eat certain foods when you are anxious, stressed, depressed, or your feelings are hurt, use food to help you cope with emotional situations, find food is comforting to you,  overeat when you are worried about something, overeat frequently when you are bored or lonely, not worry about  what you eat when you are in a good mood, overeat when you are alone, but eat much less when you are with other people, and eat as a reward.  Alicia Payne described the current frequency of emotional eating behaviors as "couple times a week." In addition, Alicia Payne denied a history of binge eating behaviors. Alicia Payne denied a history of restricting food intake, purging and engagement in other compensatory strategies, and has never been diagnosed with an eating disorder. She also denied a history of treatment for emotional eating. Furthermore, Alicia Payne denied other problems of concern.   ? ?Mental Status Examination:  ?Appearance: neat ?Behavior: appropriate to circumstances ?Mood: neutral ?Affect: mood congruent ?Speech: WNL ?Eye Contact: appropriate ?Psychomotor Activity: WNL ?Gait: unable to assess  ?Thought Process: linear, logical, and goal directed and denies suicidal, homicidal, and self-harm ideation, plan and intent  ?Thought Content/Perception: no hallucinations, delusions, bizarre thinking or behavior endorsed or observed ?Orientation: AAOx4 ?Memory/Concentration: memory, attention, language, and fund of knowledge intact  ?Insight/Judgment: fair ? ?Family & Psychosocial History: Alicia Payne reported she is not in a relationship and she has two daughters (ages 9 and 85). 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 Additionally, Alicia Payne shared her highest level of education obtained is an associate's degree. Currently, Syanne's social support system consists of her colleagues, friends outside of work, and youngest daughter. Moreover, Kamrin stated she resides with her youngest daughter.  ? ?Medical History:  ?Past Medical History:  ?Diagnosis Date  ? Arthritis   ? Cyst of thyroid determined by ultrasound 03/10/2018  ?  Hot flashes   ? Hyperlipidemia   ? Joint pain   ? Knee pain   ? left knee  ? Obesity   ? Phlebitis and thrombophlebitis of superficial vessels of right lower extremity 03/20/2017  ? Rosacea   ? Trouble in sleeping   ? Varicose vein of leg   ? Vitamin D deficiency   ? ?Past Surgical History:  ?Procedure Laterality Date  ? TUBAL LIGATION    ? UTERINE FIBROID SURGERY    ? VARICOSE VEIN SURGERY    ? ?Current Outpatient Medications on File Prior to Visit  ?Medication Sig Dispense Refill  ? AMBULATORY NON FORMULARY MEDICATION Knee-high, medium compression, graduated compression stockings. ?Apply to lower extremities. [email or web address], Zippered Compression Stockings, large circ, long length 1 each 0  ? Ascorbic Acid (VITAMIN C) 1000 MG tablet Take 1,000 mg by mouth daily.    ? Bioflavonoid Products (BIOFLEX) TABS Take 1 tablet by mouth daily.    ? Black Cohosh 160 MG CAPS Take by mouth.    ? Calcium Citrate-Vitamin D (CALCIUM + D PO) Take 1 tablet by mouth daily.    ? clindamycin (CLEOCIN) 300 MG capsule Take 1 capsule (300 mg total) by mouth 3 (three) times daily until gone 30 capsule 0  ? diclofenac sodium (VOLTAREN) 1 % GEL Apply 4 g topically 4 (four) times daily. To affected joint. 100 g 11  ? metFORMIN (GLUCOPHAGE) 500 MG tablet Take 1 tablet (500 mg total) by mouth 2 (two) times daily with a meal. 60 tablet 0  ? Omega-3 Fatty Acids (FISH OIL PO) Take 1 capsule by mouth daily.    ? tirzepatide (MOUNJARO) 10 MG/0.5ML Pen Inject 10 mg into the skin once a week. 2 mL 0  ? tirzepatide (MOUNJARO) 12.5 MG/0.5ML Pen Inject 12.5 mg into the skin once a week. 6 mL 0  ? Vitamin D, Ergocalciferol, (DRISDOL)  1.25 MG (50000 UNIT) CAPS capsule Take 1 capsule (50,000 Units total) by mouth every 7 (seven) days. 4 capsule 0  ? ?No current facility-administered medications on file prior to visit.  ?Medication compliant.  ? ?Mental Health History: Alfonso reported she has never attended therapeutic services. She denied a history  of psychotropic medications. Nyella reported there is no history of hospitalizations for psychiatric concerns. Deshaun denied a family history of mental health/substance abuse related concerns. Tyesha repo

## 2022-01-01 NOTE — Telephone Encounter (Signed)
?  Office: [phone]  /  Fax: [phone] ? ?Date of Call: January 01, 2022  ?Time of Call: 3:05pm ?Duration of Call: ~4 minute(s) ?Provider: Glennie Isle, PsyD ? ?CONTENT:  This provider called Alicia Payne as she did not present for her initial appointment via [place] Visit. She explained she was unsure how to connect for today's appointment, adding she was driving due to running late at work. Due to Alicia Payne's busy day today, this provider offered the option to reschedule. Alicia Payne was grateful and requested to reschedule. This provider gave her directions on how to connect for her appointment in two weeks. A brief risk assessment was completed. Alicia Payne denied experiencing suicidal and homicidal ideation, plan, or intent. All questions/concerns addressed.  ? ?PLAN: Alicia Payne was rescheduled to January 15, 2022 at 3pm via [place] Visit. The no show fee will be waived.  ? ?

## 2022-01-01 NOTE — Progress Notes (Signed)
? ? ? ?Chief Complaint:  ? ?OBESITY ?Alicia Payne is here to discuss her progress with her obesity treatment plan along with follow-up of her obesity related diagnoses. Alicia Payne is on keeping a food journal and adhering to recommended goals of 1200-1400 calories and 90 grams of protein and states she is following her eating plan approximately 75% of the time. Alicia Payne states she is not exercising regularly. ? ?Today's visit was #: 20 ?Starting weight: 250 lbs ?Starting date: 05/10/2020 ?Today's weight: 210 lbs ?Today's date: 12/21/2021 ?Total lbs lost to date: 40 lbs ?Total lbs lost since last in-office visit: 0 ? ?Interim History: Alicia Payne has a lot of emotional eating still.  Cravings - snacks a lot after dinner at 5:30 pm.  She hits her goals for calories and proteins most days.  Denies hunger. ? ?Subjective:  ? ?1. Pre-diabetes ?We increased Mounjaro to 10 mg at last office visit.  She still craves food.  No change. ? ?2. Vitamin D deficiency ?Alicia Payne is tolerating medication(s) well without side effects.  Medication compliance is good and patient appears to be taking it as prescribed.  Denies additional concerns regarding this condition.  ? ?3. At risk for depression ?Alicia Payne is at elevated risk of depression due to loss of husband less than 1 year ago. ? ?Assessment/Plan:  ?No orders of the defined types were placed in this encounter. ? ? ?Medications Discontinued During This Encounter  ?Medication Reason  ? Vitamin D, Ergocalciferol, (DRISDOL) 1.25 MG (50000 UNIT) CAPS capsule Reorder  ? metFORMIN (GLUCOPHAGE) 500 MG tablet Reorder  ?  ? ?Meds ordered this encounter  ?Medications  ? metFORMIN (GLUCOPHAGE) 500 MG tablet  ?  Sig: Take 1 tablet (500 mg total) by mouth 2 (two) times daily with a meal.  ?  Dispense:  60 tablet  ?  Refill:  0  ? Vitamin D, Ergocalciferol, (DRISDOL) 1.25 MG (50000 UNIT) CAPS capsule  ?  Sig: Take 1 capsule (50,000 Units total) by mouth every 7 (seven) days.  ?   Dispense:  4 capsule  ?  Refill:  0  ? tirzepatide (MOUNJARO) 12.5 MG/0.5ML Pen  ?  Sig: Inject 12.5 mg into the skin once a week.  ?  Dispense:  6 mL  ?  Refill:  0  ?  ? ?1. Pre-diabetes ?Refill metformin But increase dose to BID, once with lunch and one with dinner. ?Refill Mounjaro but increase dose to 12.5 mg weekly. ?Continue prudent nutritional plan, weight loss, and increase exercise. ? ?- Refill and increase metFORMIN (GLUCOPHAGE) 500 MG tablet; Take 1 tablet (500 mg total) by mouth 2 (two) times daily with a meal.  Dispense: 60 tablet; Refill: 0 ?- Refill and increase tirzepatide (MOUNJARO) 12.5 MG/0.5ML Pen; Inject 12.5 mg into the skin once a week.  Dispense: 6 mL; Refill: 0 ? ?2. Vitamin D deficiency ?Refill ergocalciferol 50,000 IU once weekly. ? ?- Refill Vitamin D, Ergocalciferol, (DRISDOL) 1.25 MG (50000 UNIT) CAPS capsule; Take 1 capsule (50,000 Units total) by mouth every 7 (seven) days.  Dispense: 4 capsule; Refill: 0 ? ?3. At risk for depression ?Alicia Payne was given approximately 15 minutes of depression risk counseling today. She has risk factors for depression including 10. We discussed the importance of a healthy work life balance, a healthy relationship with food and a good support system. ? ?Repetitive spaced learning was employed today to elicit superior memory formation and behavioral change.  ? ?4. Obesity with current BMI of 32.9 ? ?Alicia Payne is currently  in the action stage of change. As such, her goal is to continue with weight loss efforts. She has agreed to keeping a food journal and adhering to recommended goals of 1400-1500 calories and 90+ grams of protein.  ? ?10-15 minute distraction technique discussed with patient.  Mindfulness eating discussed and I recommend Dr. Mallie Mussel referral today. ? ?Exercise goals:  As is. ? ?Behavioral modification strategies: keeping healthy foods in the home, better snacking choices, and keeping a strict food journal. ? ?Alicia Payne has agreed to  follow-up with our clinic in 4 weeks. She was informed of the importance of frequent follow-up visits to maximize her success with intensive lifestyle modifications for her multiple health conditions.  ? ?Objective:  ? ?Blood pressure 118/75, pulse 71, temperature 97.8 ?F (36.6 ?C), height '5\' 7"'$  (1.702 m), weight 210 lb (95.3 kg), last menstrual period 11/20/2014, SpO2 100 %. ?Body mass index is 32.89 kg/m?. ? ?General: Cooperative, alert, well developed, in no acute distress. ?HEENT: Conjunctivae and lids unremarkable. ?Cardiovascular: Regular rhythm.  ?Lungs: Normal work of breathing. ?Neurologic: No focal deficits.  ? ?Lab Results  ?Component Value Date  ? CREATININE 0.82 08/08/2021  ? BUN 13 08/08/2021  ? NA 139 08/08/2021  ? K 4.5 08/08/2021  ? CL 101 08/08/2021  ? CO2 23 08/08/2021  ? ?Lab Results  ?Component Value Date  ? ALT 24 08/08/2021  ? AST 18 08/08/2021  ? ALKPHOS 65 08/08/2021  ? BILITOT 0.6 08/08/2021  ? ?Lab Results  ?Component Value Date  ? HGBA1C 5.6 11/23/2021  ? HGBA1C 5.4 08/08/2021  ? HGBA1C 5.8 (H) 04/20/2021  ? HGBA1C 5.9 (H) 10/04/2020  ? HGBA1C 6.0 (H) 05/10/2020  ? ?Lab Results  ?Component Value Date  ? INSULIN 12.3 08/08/2021  ? INSULIN 9.9 04/20/2021  ? INSULIN 9.1 10/04/2020  ? INSULIN 16.9 05/10/2020  ? ?Lab Results  ?Component Value Date  ? TSH 1.310 08/08/2021  ? ?Lab Results  ?Component Value Date  ? CHOL 252 (H) 11/23/2021  ? HDL 44 11/23/2021  ? LDLCALC 172 (H) 11/23/2021  ? LDLDIRECT 165 (H) 11/23/2021  ? TRIG 193 (H) 11/23/2021  ? CHOLHDL 5.7 (H) 11/23/2021  ? ?Lab Results  ?Component Value Date  ? VD25OH 57.5 11/23/2021  ? VD25OH 53.2 04/20/2021  ? VD25OH 45.8 10/04/2020  ? ?Lab Results  ?Component Value Date  ? WBC 6.0 04/20/2021  ? HGB 15.7 04/20/2021  ? HCT 46.3 04/20/2021  ? MCV 91 04/20/2021  ? PLT 204 04/20/2021  ? ?Attestation Statements:  ? ?Reviewed by clinician on day of visit: allergies, medications, problem list, medical history, surgical history, family history,  social history, and previous encounter notes. ? ?I, Water quality scientist, CMA, am acting as transcriptionist for [employer], DO. ? ?I have reviewed the above documentation for accuracy and completeness, and I agree with the above. Marjory Sneddon, D.O. ? ?The [place] was signed into law in 2016 which includes the topic of electronic health records.  This provides immediate access to information in MyChart.  This includes consultation notes, operative notes, office notes, lab results and pathology reports.  If you have any questions about what you read please let us know at your next visit so we can discuss your concerns and take corrective action if need be.  We are right here with you. ? ?

## 2022-01-15 ENCOUNTER — Telehealth (INDEPENDENT_AMBULATORY_CARE_PROVIDER_SITE_OTHER): Payer: No Typology Code available for payment source | Admitting: Psychology

## 2022-01-15 DIAGNOSIS — F5089 Other specified eating disorder: Secondary | ICD-10-CM | POA: Diagnosis not present

## 2022-01-16 NOTE — Progress Notes (Signed)
?  Office: [phone]  /  Fax: [phone] ? ? ? ?Date: 01/30/2022   ?Appointment Start Time: 2:30pm ?Duration: 24 minutes ?Provider: Glennie Isle, Psy.D. ?Type of Session: Individual Therapy  ?Location of Patient: Work (private location) ?Location of Provider: Provider's Home (private office) ?Type of Contact: Telepsychological Visit via MyChart Video Visit ? ?Session Content: Alicia Payne is a 56 y.o. female presenting for a follow-up appointment to address the previously established treatment goal of increasing coping skills.Today's appointment was a telepsychological visit due to COVID-19. [name] provided verbal consent for today's telepsychological appointment and she is aware she is responsible for securing confidentiality on her end of the session. Prior to proceeding with today's appointment, [name]'s physical location at the time of this appointment was obtained as well a phone number she could be reached at in the event of technical difficulties. [name] and this provider participated in today's telepsychological service. Of note, today's appointment was switched to a regular telephone call at 2:33pm with [name]'s verbal consent due to technical issues.  ? ?This provider conducted a brief check-in. [name] shared about recent events. Reviewed emotional and physical hunger. Psychoeducation regarding triggers for emotional eating was provided. [name] was provided a handout, and encouraged to utilize the handout between now and the next appointment to increase awareness of triggers and frequency. [name] agreed. This provider also discussed behavioral strategies for specific triggers, such as placing the utensil down when conversing to avoid mindless eating. [name] provided verbal consent during today's appointment for this provider to send a handout about triggers via e-mail. [name] was receptive to today's appointment as evidenced by openness to sharing, responsiveness to feedback, and  willingness to explore triggers for emotional eating. ? ?Mental Status Examination:  ?Appearance: neat ?Behavior: appropriate to circumstances ?Mood: neutral ?Affect: mood congruent ?Speech: WNL ?Eye Contact: appropriate ?Psychomotor Activity: WNL ?Gait: unable to assess ?Thought Process: linear, logical, and goal directed and no evidence or endorsement of suicidal, homicidal, and self-harm ideation, plan and intent  ?Thought Content/Perception: no hallucinations, delusions, bizarre thinking or behavior endorsed or observed ?Orientation: AAOx4 ?Memory/Concentration: memory, attention, language, and fund of knowledge intact  ?Insight: fair ?Judgment: fair ? ?Interventions:  ?Conducted a brief chart review ?Provided empathic reflections and validation ?Reviewed content from the previous session ?Employed supportive psychotherapy interventions to facilitate reduced distress and to improve coping skills with identified stressors ?Psychoeducation provided regarding triggers for emotional eating behaviors ? ?DSM-5 Diagnosis(es): F50.89 Other Specified Feeding or Eating Disorder, Emotional Eating Behaviors ? ?Treatment Goal & Progress: During the initial appointment with this provider, the following treatment goal was established: increase coping skills. Progress is limited, as [name] has just begun treatment with this provider; however, she is receptive to the interaction and interventions and rapport is being established.  ? ?Plan: The next appointment is scheduled for 02/27/2022 at 2:30pm, which will be via MyChart Video Visit. The next session will focus on working towards the established treatment goal. ? ?

## 2022-01-22 ENCOUNTER — Ambulatory Visit (INDEPENDENT_AMBULATORY_CARE_PROVIDER_SITE_OTHER): Payer: No Typology Code available for payment source | Admitting: Family Medicine

## 2022-01-24 ENCOUNTER — Other Ambulatory Visit: Payer: Self-pay

## 2022-01-24 DIAGNOSIS — I8393 Asymptomatic varicose veins of bilateral lower extremities: Secondary | ICD-10-CM

## 2022-01-30 ENCOUNTER — Telehealth (INDEPENDENT_AMBULATORY_CARE_PROVIDER_SITE_OTHER): Payer: No Typology Code available for payment source | Admitting: Psychology

## 2022-01-30 DIAGNOSIS — F5089 Other specified eating disorder: Secondary | ICD-10-CM | POA: Diagnosis not present

## 2022-02-07 ENCOUNTER — Ambulatory Visit (HOSPITAL_COMMUNITY)
Admission: RE | Admit: 2022-02-07 | Discharge: 2022-02-07 | Disposition: A | Discharge status: Home / Self Care | Payer: No Typology Code available for payment source | Source: Ambulatory Visit | Attending: Vascular Surgery | Admitting: Vascular Surgery

## 2022-02-07 ENCOUNTER — Ambulatory Visit (INDEPENDENT_AMBULATORY_CARE_PROVIDER_SITE_OTHER): Payer: No Typology Code available for payment source | Admitting: Vascular Surgery

## 2022-02-07 ENCOUNTER — Encounter: Payer: No Typology Code available for payment source | Admitting: Vascular Surgery

## 2022-02-07 ENCOUNTER — Encounter: Payer: Self-pay | Admitting: Vascular Surgery

## 2022-02-07 VITALS — BP 110/62 | HR 53 | Temp 98.3°F | Resp 18 | Ht 67.5 in | Wt 217.9 lb

## 2022-02-07 DIAGNOSIS — I83811 Varicose veins of right lower extremities with pain: Secondary | ICD-10-CM

## 2022-02-07 DIAGNOSIS — I8393 Asymptomatic varicose veins of bilateral lower extremities: Secondary | ICD-10-CM | POA: Diagnosis present

## 2022-02-07 NOTE — Progress Notes (Signed)
? ?ASSESSMENT & PLAN  ? ?PAINFUL VARICOSE VEINS OF THE RIGHT LOWER EXTREMITY: This patient has markedly dilated varicose veins of the right lower extremity which are under significant pressure and are causing significant symptoms.  We have discussed the importance of intermittent leg elevation and the proper positioning for this.  In addition we have fitted her for some thigh-high compression stockings with a gradient of 20 to 30 mmHg.  I encouraged her to avoid prolonged sitting and standing.  We discussed the importance of exercise.  She has lost 40 pounds and this is certainly very helpful.  I will plan on seeing her back in 3 months.  If her symptoms have not improved, then I think she would be a good candidate for greater than 20 stab phlebectomies in the right leg.  There is a anterior accessory saphenous vein on my exam which appears to be feeding these varicosities however it is a very short segment that is markedly dilated.  I did not think that she would be a good candidate for laser ablation of this given the short segment that is quite dilated.  I think she would be at increased risk for DVT with this.  However by ligating the anterior sensory saphenous vein we can address this.  I will see her back in 3 months.  She knows to call sooner if she has problems ? ?REASON FOR CONSULT:   ? ?Painful varicose veins.  The consult is requested by Dr. Debbrah Alar. ? ?HPI:  ? ?Alicia Payne is a 56 y.o. female who presents with painful varicose veins of the right lower extremity.  She developed varicose veins when she was in high school over 40 years ago.  These became more prominent during her pregnancies.  Her last child was in 1998.  She developed cellulitis in the right leg in 2008 and at that time underwent what sounds like laser ablation of the great saphenous vein in [place].  In 2009 she had a similar procedure on the left leg.  She gradually developed progressive varicosities in her  right leg which are painful.  She describes aching pain and heaviness in addition to throbbing and swelling in the right leg.  Her symptoms are aggravated by standing and sitting and relieved with elevation.  She has been wearing knee-high compression stockings. ? ?She has no previous history of DVT. ? ?Past Medical History:  ?Diagnosis Date  ? Arthritis   ? Cyst of thyroid determined by ultrasound 03/10/2018  ? Hot flashes   ? Hyperlipidemia   ? Joint pain   ? Knee pain   ? left knee  ? Obesity   ? Phlebitis and thrombophlebitis of superficial vessels of right lower extremity 03/20/2017  ? Rosacea   ? Trouble in sleeping   ? Varicose vein of leg   ? Vitamin D deficiency   ? ? ?Family History  ?Problem Relation Age of Onset  ? Cancer Mother   ?     thyroid  ? Thyroid cancer Mother 57  ? Thyroid nodules Sister   ?     thyroidectomy  ? Melanoma Sister   ?     stage 4  ? Colon polyps Neg Hx   ? Colon cancer Neg Hx   ? Esophageal cancer Neg Hx   ? Stomach cancer Neg Hx   ? Rectal cancer Neg Hx   ? ? ?SOCIAL HISTORY: ?Social History  ? ?Tobacco Use  ? Smoking status: Former  ?  Packs/day: 0.30  ?  Types: Cigarettes  ?  Start date: 2  ?  Quit date: 1998  ?  Years since quitting: 25.3  ? Smokeless tobacco: Never  ?Substance Use Topics  ? Alcohol use: Yes  ?  Comment: social drinker  ? ? ?No Known Allergies ? ?Current Outpatient Medications  ?Medication Sig Dispense Refill  ? AMBULATORY NON FORMULARY MEDICATION Knee-high, medium compression, graduated compression stockings. ?Apply to lower extremities. [email or web address], Zippered Compression Stockings, large circ, long length 1 each 0  ? Ascorbic Acid (VITAMIN C) 1000 MG tablet Take 1,000 mg by mouth daily.    ? Bioflavonoid Products (BIOFLEX) TABS Take 1 tablet by mouth daily.    ? Black Cohosh 160 MG CAPS Take by mouth.    ? Calcium Citrate-Vitamin D (CALCIUM + D PO) Take 1 tablet by mouth daily.    ? clindamycin (CLEOCIN) 300 MG capsule Take 1 capsule (300 mg total)  by mouth 3 (three) times daily until gone 30 capsule 0  ? diclofenac sodium (VOLTAREN) 1 % GEL Apply 4 g topically 4 (four) times daily. To affected joint. 100 g 11  ? metFORMIN (GLUCOPHAGE) 500 MG tablet Take 1 tablet (500 mg total) by mouth 2 (two) times daily with a meal. 60 tablet 0  ? Omega-3 Fatty Acids (FISH OIL PO) Take 1 capsule by mouth daily.    ? Vitamin D, Ergocalciferol, (DRISDOL) 1.25 MG (50000 UNIT) CAPS capsule Take 1 capsule (50,000 Units total) by mouth every 7 (seven) days. 4 capsule 0  ? tirzepatide (MOUNJARO) 10 MG/0.5ML Pen Inject 10 mg into the skin once a week. (Patient not taking: Reported on 02/07/2022) 2 mL 0  ? tirzepatide (MOUNJARO) 12.5 MG/0.5ML Pen Inject 12.5 mg into the skin once a week. (Patient not taking: Reported on 02/07/2022) 6 mL 0  ? ?No current facility-administered medications for this visit.  ? ? ?REVIEW OF SYSTEMS:  ?'[X]'$  denotes positive finding, '[ ]'$  denotes negative finding ?Cardiac  Comments:  ?Chest pain or chest pressure:    ?Shortness of breath upon exertion:    ?Short of breath when lying flat:    ?Irregular heart rhythm:    ?    ?Vascular    ?Pain in calf, thigh, or hip brought on by ambulation:    ?Pain in feet at night that wakes you up from your sleep:     ?Blood clot in your veins:    ?Leg swelling:  x   ?    ?Pulmonary    ?Oxygen at home:    ?Productive cough:     ?Wheezing:     ?    ?Neurologic    ?Sudden weakness in arms or legs:     ?Sudden numbness in arms or legs:     ?Sudden onset of difficulty speaking or slurred speech:    ?Temporary loss of vision in one eye:     ?Problems with dizziness:     ?    ?Gastrointestinal    ?Blood in stool:     ?Vomited blood:     ?    ?Genitourinary    ?Burning when urinating:     ?Blood in urine:    ?    ?Psychiatric    ?Major depression:     ?    ?Hematologic    ?Bleeding problems:    ?Problems with blood clotting too easily:    ?    ?Skin    ?Rashes or ulcers:    ?    ?  Constitutional    ?Fever or chills:     ?- ? ?PHYSICAL EXAM:  ? ?Vitals:  ? 02/07/22 1555  ?BP: 110/62  ?Pulse: (!) 53  ?Resp: 18  ?Temp: 98.3 ?F (36.8 ?C)  ?TempSrc: Temporal  ?SpO2: 99%  ?Weight: 217 lb 14.4 oz (98.8 kg)  ?Height: 5' 7.5" (1.715 m)  ? ?Body mass index is 33.62 kg/m?. ?GENERAL: The patient is a well-nourished female, in no acute distress. The vital signs are documented above. ?CARDIAC: There is a regular rate and rhythm.  ?VASCULAR: I do not detect carotid bruits. ?She has palpable pedal pulses. ?She has markedly dilated varicose veins in her anterior and lateral right thigh and right leg. ? ? ? ? ? ? ?In addition I did look at the right groin myself and it appears that she has a markedly dilated anterior sensory saphenous vein but it is a fairly short segment and I think it would be difficult to perform laser ablation given the short segment which is markedly dilated.  I think she would be at increased risk for DVT with this. ? ?PULMONARY: There is good air exchange bilaterally without wheezing or rales. ?ABDOMEN: Soft and non-tender with normal pitched bowel sounds.  ?MUSCULOSKELETAL: There are no major deformities. ?NEUROLOGIC: No focal weakness or paresthesias are detected. ?SKIN: There are no ulcers or rashes noted. ?PSYCHIATRIC: The patient has a normal affect. ? ?DATA:   ? ?VENOUS DUPLEX: I have independently interpreted her venous duplex scan today this was of the right lower extremity only. ? ?There is no evidence of DVT in the right leg. ? ?There was deep venous reflux in the common femoral vein, femoral vein, and popliteal vein. ? ?There was dilated veins in the groin up to 22 mm but the great saphenous vein has been ablated.  There is reflux at the right saphenous popliteal junction but none below that and the vein is not especially dilated. ? ? ? ? ?Alicia Payne ?[facility] ?

## 2022-02-13 NOTE — Progress Notes (Signed)
  Office: [phone]  /  Fax: [phone]    Date: 02/27/2022   Appointment Start Time: 2:33pm Duration: 21 minutes Provider: Glennie Isle, Psy.D. Type of Session: Individual Therapy  Location of Patient: Parked in car at work (private/safe location) Location of Provider: Provider's Home (private office) Type of Contact: Telepsychological Visit via MyChart Video Visit  Session Content: Alicia Payne is a 56 y.o. female presenting for a follow-up appointment to address the previously established treatment goal of increasing coping skills.Today's appointment was a telepsychological visit due to COVID-19. Alicia Payne provided verbal consent for today's telepsychological appointment and she is aware she is responsible for securing confidentiality on her end of the session. Prior to proceeding with today's appointment, Alicia Payne's physical location at the time of this appointment was obtained as well a phone number she could be reached at in the event of technical difficulties. Alicia Payne and this provider participated in today's telepsychological service.   Per [employer] new policy, Quincey will be e-mailed two additional forms (AOB and Receipt of NPP) to sign as well as [facility]'s Notice of Privacy Practices. The content of the documents were explained to [place] at the onset of the appointment, and she agreed to proceed. Additionally, she agreed to complete the forms and return them prior to the next appointment with this provider.  Of note, today's appointment was switched to a regular telephone call at 2:51pm with Alicia Payne's verbal consent due to her phone overheating.   This provider conducted a brief check-in. Alicia Payne reported she is maintaining her weight. This was further explored. She acknowledged engagement in emotional eating behaviors secondary to various triggers discussed during the last appointment. Alicia Payne was engaged in problem solving to develop a plan to help cope with urges/cravings  involving activities to relax, activities to distract, comforting places, people to call and connect with, and activities that help soothe senses. She was observed writing the plan. Alicia Payne was receptive to today's appointment as evidenced by openness to sharing, responsiveness to feedback, and willingness to implement discussed strategies .  Mental Status Examination:  Appearance: neat Behavior: appropriate to circumstances Mood: neutral Affect: mood congruent Speech: WNL Eye Contact: appropriate Psychomotor Activity: WNL Gait: unable to assess Thought Process: linear, logical, and goal directed and no evidence or endorsement of suicidal, homicidal, and self-harm ideation, plan and intent  Thought Content/Perception: no hallucinations, delusions, bizarre thinking or behavior endorsed or observed Orientation: AAOx4 Memory/Concentration: memory, attention, language, and fund of knowledge intact  Insight: fair Judgment: fair  Interventions:  Conducted a brief chart review Provided empathic reflections and validation Reviewed content from the previous session Employed supportive psychotherapy interventions to facilitate reduced distress and to improve coping skills with identified stressors Engaged patient in problem solving  DSM-5 Diagnosis(es): F50.89 Other Specified Feeding or Eating Disorder, Emotional Eating Behaviors  Treatment Goal & Progress: During the initial appointment with this provider, the following treatment goal was established: increase coping skills. Alicia Payne has demonstrated progress in her goal as evidenced by increased awareness of hunger patterns and increased awareness of triggers for emotional eating behaviors. Alicia Payne also continues to demonstrate willingness to engage in learned skill(s).  Plan: The next appointment is scheduled for 03/26/2022 at 4:30pm, which will be via MyChart Video Visit. The next session will focus on working towards the established  treatment goal. Additionally, Alicia Payne will complete and return forms.

## 2022-02-19 ENCOUNTER — Ambulatory Visit (INDEPENDENT_AMBULATORY_CARE_PROVIDER_SITE_OTHER): Payer: No Typology Code available for payment source | Admitting: Family Medicine

## 2022-02-19 ENCOUNTER — Other Ambulatory Visit (HOSPITAL_COMMUNITY): Payer: Self-pay

## 2022-02-19 ENCOUNTER — Encounter (INDEPENDENT_AMBULATORY_CARE_PROVIDER_SITE_OTHER): Payer: Self-pay | Admitting: Family Medicine

## 2022-02-19 VITALS — BP 90/61 | HR 84 | Temp 97.9°F | Ht 67.0 in | Wt 209.0 lb

## 2022-02-19 DIAGNOSIS — R7303 Prediabetes: Secondary | ICD-10-CM

## 2022-02-19 DIAGNOSIS — Z6832 Body mass index (BMI) 32.0-32.9, adult: Secondary | ICD-10-CM

## 2022-02-19 DIAGNOSIS — F5089 Other specified eating disorder: Secondary | ICD-10-CM

## 2022-02-19 DIAGNOSIS — E559 Vitamin D deficiency, unspecified: Secondary | ICD-10-CM

## 2022-02-19 DIAGNOSIS — E669 Obesity, unspecified: Secondary | ICD-10-CM

## 2022-02-19 DIAGNOSIS — Z9189 Other specified personal risk factors, not elsewhere classified: Secondary | ICD-10-CM

## 2022-02-19 MED ORDER — METFORMIN HCL 500 MG PO TABS
500.0000 mg | ORAL_TABLET | Freq: Two times a day (BID) | ORAL | 0 refills | Status: DC
  Filled 2022-02-19: qty 180, 90d supply, fill #0

## 2022-02-19 MED ORDER — VITAMIN D (ERGOCALCIFEROL) 1.25 MG (50000 UNIT) PO CAPS
50000.0000 [IU] | ORAL_CAPSULE | ORAL | 0 refills | Status: DC
  Filled 2022-02-19: qty 12, 84d supply, fill #0

## 2022-02-23 ENCOUNTER — Other Ambulatory Visit (HOSPITAL_COMMUNITY): Payer: Self-pay

## 2022-02-23 ENCOUNTER — Ambulatory Visit: Payer: No Typology Code available for payment source | Admitting: Physician Assistant

## 2022-02-23 MED ORDER — PREDNISONE 10 MG (48) PO TBPK
ORAL_TABLET | ORAL | 0 refills | Status: DC
  Filled 2022-02-23: qty 48, 12d supply, fill #0

## 2022-02-25 NOTE — Progress Notes (Unsigned)
Chief Complaint:   OBESITY Alicia Payne is here to discuss her progress with her obesity treatment plan along with follow-up of her obesity related diagnoses. Alicia Payne is keeping a food journal of 1400 to 1500 calories and 90 grams of protein and states she is following her eating plan approximately 70% of the time. Alicia Payne states she is walking the dog 30 minutes 7 times per week.  Today's visit was #: 21 Starting weight: 250 lbs Starting date: 05/10/2020 Today's weight: 209 lbs Today's date: 02/19/2022 Total lbs lost to date: 41 lbs Total lbs lost since last in-office visit: 1  Interim History: Alicia Payne has been traveling a lot. Her last office visit was 12/21/21. She has gotten back to her meal plan and logging her intake.  Subjective:   1. Pre-diabetes Alicia Payne went off Mounjaro at the end of March. The 12.'5mg'$  caused a lot of GI side effects and wishes to remain off GLP-1's. Alicia Payne is still on metformin.  2. Vitamin D deficiency She is currently taking prescription vitamin D 50,000 IU each week. She denies nausea, vomiting or muscle weakness.  Lab Results  Component Value Date   VD25OH 57.5 11/23/2021   VD25OH 53.2 04/20/2021   VD25OH 45.8 10/04/2020   3. Other disorder of eating, with emotional eating Alicia Payne has seen Dr. Mallie Mussel 2 times now. She doesn't feel that her symptoms are out of control and she is gaining awareness.  4. At risk for impaired metabolic function Alicia Payne is at risk of impaired metabolic function due to prediabetes and obesity.  Assessment/Plan:  No orders of the defined types were placed in this encounter.   Medications Discontinued During This Encounter  Medication Reason   tirzepatide (MOUNJARO) 12.5 MG/0.5ML Pen    tirzepatide (MOUNJARO) 10 MG/0.5ML Pen    metFORMIN (GLUCOPHAGE) 500 MG tablet Reorder   Vitamin D, Ergocalciferol, (DRISDOL) 1.25 MG (50000 UNIT) CAPS capsule Reorder     Meds ordered this encounter  Medications   Vitamin  D, Ergocalciferol, (DRISDOL) 1.25 MG (50000 UNIT) CAPS capsule    Sig: Take 1 capsule (50,000 Units total) by mouth every 7 (seven) days.    Dispense:  12 capsule    Refill:  0   metFORMIN (GLUCOPHAGE) 500 MG tablet    Sig: Take 1 tablet (500 mg total) by mouth 2 (two) times daily with a meal.    Dispense:  180 tablet    Refill:  0     1. Pre-diabetes Alicia Payne agrees to continue taking metformin and will discontinue taking Mounjaro. Alicia Payne will continue to work on weight loss, exercise, and decreasing simple carbohydrates to help decrease the risk of diabetes.   - metFORMIN (GLUCOPHAGE) 500 MG tablet; Take 1 tablet (500 mg total) by mouth 2 (two) times daily with a meal.  Dispense: 180 tablet; Refill: 0  2. Vitamin D deficiency Low Vitamin D level contributes to fatigue and are associated with obesity, breast, and colon cancer. She agrees to continue to take prescription Vitamin D '@50'$ ,000 IU every week and will follow-up for routine testing of Vitamin D, at least 2-3 times per year to avoid over-replacement.  - Vitamin D, Ergocalciferol, (DRISDOL) 1.25 MG (50000 UNIT) CAPS capsule; Take 1 capsule (50,000 Units total) by mouth every 7 (seven) days.  Dispense: 12 capsule; Refill: 0  3. Other disorder of eating, with emotional eating Alicia Payne agrees to continue her current treatment plan and she denies the need for a change.  4. At risk for impaired metabolic function Alicia Payne  was given approximately 9 minutes of impaired metabolic function prevention counseling today. We discussed intensive lifestyle modifications today with an emphasis on specific nutrition and exercise instructions and strategies.   Repetitive spaced learning was employed today to elicit superior memory formation and behavioral change.   5. Obesity with current BMI of 32.8 Alicia Payne is currently in the action stage of change. As such, her goal is to continue with weight loss efforts. She has agreed to keeping a food  journal and adhering to recommended goals of 1400 to 1500 calories and 90 grams of protein.   Exercise goals:  As is.  Behavioral modification strategies: increasing lean protein intake, decreasing simple carbohydrates, and keeping a strict food journal.  Alicia Payne has agreed to follow-up with our clinic in 8 weeks per the patient's preference. She was informed of the importance of frequent follow-up visits to maximize her success with intensive lifestyle modifications for her multiple health conditions.   Objective:   Blood pressure 90/61, pulse 84, temperature 97.9 F (36.6 C), height '5\' 7"'$  (1.702 m), weight 209 lb (94.8 kg), last menstrual period 11/20/2014, SpO2 99 %. Body mass index is 32.73 kg/m.  General: Cooperative, alert, well developed, in no acute distress. HEENT: Conjunctivae and lids unremarkable. Cardiovascular: Regular rhythm.  Lungs: Normal work of breathing. Neurologic: No focal deficits.   Lab Results  Component Value Date   CREATININE 0.82 08/08/2021   BUN 13 08/08/2021   NA 139 08/08/2021   K 4.5 08/08/2021   CL 101 08/08/2021   CO2 23 08/08/2021   Lab Results  Component Value Date   ALT 24 08/08/2021   AST 18 08/08/2021   ALKPHOS 65 08/08/2021   BILITOT 0.6 08/08/2021   Lab Results  Component Value Date   HGBA1C 5.6 11/23/2021   HGBA1C 5.4 08/08/2021   HGBA1C 5.8 (H) 04/20/2021   HGBA1C 5.9 (H) 10/04/2020   HGBA1C 6.0 (H) 05/10/2020   Lab Results  Component Value Date   INSULIN 12.3 08/08/2021   INSULIN 9.9 04/20/2021   INSULIN 9.1 10/04/2020   INSULIN 16.9 05/10/2020   Lab Results  Component Value Date   TSH 1.310 08/08/2021   Lab Results  Component Value Date   CHOL 252 (H) 11/23/2021   HDL 44 11/23/2021   LDLCALC 172 (H) 11/23/2021   LDLDIRECT 165 (H) 11/23/2021   TRIG 193 (H) 11/23/2021   CHOLHDL 5.7 (H) 11/23/2021   Lab Results  Component Value Date   VD25OH 57.5 11/23/2021   VD25OH 53.2 04/20/2021   VD25OH 45.8 10/04/2020    Lab Results  Component Value Date   WBC 6.0 04/20/2021   HGB 15.7 04/20/2021   HCT 46.3 04/20/2021   MCV 91 04/20/2021   PLT 204 04/20/2021   No results found for: IRON, TIBC, FERRITIN  Attestation Statements:   Reviewed by clinician on day of visit: allergies, medications, problem list, medical history, surgical history, family history, social history, and previous encounter notes.  IMarcille Blanco, CMA, am acting as transcriptionist for [employer], DO  I have reviewed the above documentation for accuracy and completeness, and I agree with the above. Marjory Sneddon, D.O.  The [place] was signed into law in 2016 which includes the topic of electronic health records.  This provides immediate access to information in MyChart.  This includes consultation notes, operative notes, office notes, lab results and pathology reports.  If you have any questions about what you read please let us know  at your next visit so we can discuss your concerns and take corrective action if need be.  We are right here with you.

## 2022-02-27 ENCOUNTER — Telehealth (INDEPENDENT_AMBULATORY_CARE_PROVIDER_SITE_OTHER): Payer: No Typology Code available for payment source | Admitting: Psychology

## 2022-02-27 DIAGNOSIS — F5089 Other specified eating disorder: Secondary | ICD-10-CM

## 2022-02-28 ENCOUNTER — Ambulatory Visit: Payer: No Typology Code available for payment source | Admitting: Family Medicine

## 2022-03-09 ENCOUNTER — Ambulatory Visit: Payer: No Typology Code available for payment source | Admitting: Family

## 2022-03-12 NOTE — Progress Notes (Unsigned)
  Office: [phone]  /  Fax: [phone]    Date: 03/26/2022   Appointment Start Time: *** Duration: *** minutes Provider: Glennie Isle, Psy.D. Type of Session: Individual Therapy  Location of Patient: {gbptloc:23249} (private location) Location of Provider: Provider's Home (private office) Type of Contact: Telepsychological Visit via MyChart Video Visit  Session Content: Alicia Payne is a 56 y.o. female presenting for a follow-up appointment to address the previously established treatment goal of increasing coping skills.Today's appointment was a telepsychological visit due to COVID-19. [name] provided verbal consent for today's telepsychological appointment and she is aware she is responsible for securing confidentiality on her end of the session. Prior to proceeding with today's appointment, [name]'s physical location at the time of this appointment was obtained as well a phone number she could be reached at in the event of technical difficulties. [name] and this provider participated in today's telepsychological service.   This provider conducted a brief check-in. *** [name] was receptive to today's appointment as evidenced by openness to sharing, responsiveness to feedback, and {gbreceptiveness:23401}.  Mental Status Examination:  Appearance: {Appearance:22431} Behavior: {Behavior:22445} Mood: {gbmood:21757} Affect: {Affect:22436} Speech: {Speech:22432} Eye Contact: {Eye Contact:22433} Psychomotor Activity: {Motor Activity:22434} Gait: {gbgait:23404} Thought Process: {thought process:22448}  Thought Content/Perception: {disturbances:22451} Orientation: {Orientation:22437} Memory/Concentration: {gbcognition:22449} Insight: {Insight:22446} Judgment: {Insight:22446}  Interventions:  {Interventions for Progress Notes:23405}  DSM-5 Diagnosis(es): F50.89 Other Specified Feeding or Eating Disorder, Emotional Eating Behaviors  Treatment Goal & Progress: During the initial  appointment with this provider, the following treatment goal was established: increase coping skills. [name] has demonstrated progress in her goal as evidenced by {gbtxprogress:22839}. [name] also {gbtxprogress2:22951}.  Plan: The next appointment is scheduled for *** at ***, which will be via MyChart Video Visit. The next session will focus on {Plan for Next Appointment:23400}.

## 2022-03-26 ENCOUNTER — Telehealth (INDEPENDENT_AMBULATORY_CARE_PROVIDER_SITE_OTHER): Payer: No Typology Code available for payment source | Admitting: Psychology

## 2022-04-23 ENCOUNTER — Other Ambulatory Visit (HOSPITAL_COMMUNITY): Payer: Self-pay

## 2022-04-23 ENCOUNTER — Encounter (INDEPENDENT_AMBULATORY_CARE_PROVIDER_SITE_OTHER): Payer: Self-pay | Admitting: Family Medicine

## 2022-04-23 ENCOUNTER — Ambulatory Visit (INDEPENDENT_AMBULATORY_CARE_PROVIDER_SITE_OTHER): Payer: No Typology Code available for payment source | Admitting: Family Medicine

## 2022-04-23 VITALS — BP 119/66 | HR 83 | Temp 97.8°F | Ht 67.0 in | Wt 210.0 lb

## 2022-04-23 DIAGNOSIS — Z6832 Body mass index (BMI) 32.0-32.9, adult: Secondary | ICD-10-CM

## 2022-04-23 DIAGNOSIS — E669 Obesity, unspecified: Secondary | ICD-10-CM | POA: Diagnosis not present

## 2022-04-23 DIAGNOSIS — E66812 Obesity, class 2: Secondary | ICD-10-CM

## 2022-04-23 DIAGNOSIS — Z9189 Other specified personal risk factors, not elsewhere classified: Secondary | ICD-10-CM

## 2022-04-23 DIAGNOSIS — E559 Vitamin D deficiency, unspecified: Secondary | ICD-10-CM

## 2022-04-23 DIAGNOSIS — R7303 Prediabetes: Secondary | ICD-10-CM | POA: Diagnosis not present

## 2022-04-23 DIAGNOSIS — Z7984 Long term (current) use of oral hypoglycemic drugs: Secondary | ICD-10-CM

## 2022-04-23 MED ORDER — METFORMIN HCL 500 MG PO TABS
500.0000 mg | ORAL_TABLET | Freq: Two times a day (BID) | ORAL | 0 refills | Status: DC
  Filled 2022-04-23: qty 120, 60d supply, fill #0

## 2022-04-23 MED ORDER — VITAMIN D (ERGOCALCIFEROL) 1.25 MG (50000 UNIT) PO CAPS
50000.0000 [IU] | ORAL_CAPSULE | ORAL | 0 refills | Status: DC
  Filled 2022-04-23: qty 12, 84d supply, fill #0

## 2022-04-26 NOTE — Progress Notes (Signed)
Chief Complaint:   OBESITY Alicia Payne is here to discuss her progress with her obesity treatment plan along with follow-up of her obesity related diagnoses. Alicia Payne is on keeping a food journal and adhering to recommended goals of 1400-1500 calories and 90 grams protein and states she is following her eating plan approximately 50% of the time. Alicia Payne states she is not currently exercising.  Today's visit was #: 22 Starting weight: 250 lbs Starting date: 05/10/2020 Today's weight: 210 lbs Today's date: 04/23/2022 Total lbs lost to date: 40 Total lbs lost since last in-office visit: +1  Interim History: Pt is happy with maintaining her weight. She denies overeating, but is not necessarily following the meal plan.  Subjective:   1. Pre-diabetes Metformin was prescribed at pt's last OV. She is taking it twice a day and tolerating it well.  2. Vitamin D deficiency She is currently taking prescription vitamin D 50,000 IU each week. She denies nausea, vomiting or muscle weakness.  3. At risk for activity intolerance Alicia Payne is at risk for exercise intolerance due to inactivity.  Assessment/Plan:  No orders of the defined types were placed in this encounter.   Medications Discontinued During This Encounter  Medication Reason   Vitamin D, Ergocalciferol, (DRISDOL) 1.25 MG (50000 UNIT) CAPS capsule Reorder   metFORMIN (GLUCOPHAGE) 500 MG tablet Reorder     Meds ordered this encounter  Medications   Vitamin D, Ergocalciferol, (DRISDOL) 1.25 MG (50000 UNIT) CAPS capsule    Sig: Take 1 capsule (50,000 Units total) by mouth every 7 (seven) days.    Dispense:  12 capsule    Refill:  0   metFORMIN (GLUCOPHAGE) 500 MG tablet    Sig: Take 1 tablet (500 mg total) by mouth 2 (two) times daily with a meal.    Dispense:  120 tablet    Refill:  0     1. Pre-diabetes Alicia Payne will continue to work on weight loss, exercise, and decreasing simple carbohydrates to help decrease the risk  of diabetes.   Refill- metFORMIN (GLUCOPHAGE) 500 MG tablet; Take 1 tablet (500 mg total) by mouth 2 (two) times daily with a meal.  Dispense: 120 tablet; Refill: 0  2. Vitamin D deficiency Low Vitamin D level contributes to fatigue and are associated with obesity, breast, and colon cancer. She agrees to continue to take prescription Vitamin D '@50'$ ,000 IU every week and will follow-up for routine testing of Vitamin D, at least 2-3 times per year to avoid over-replacement.  Refill- Vitamin D, Ergocalciferol, (DRISDOL) 1.25 MG (50000 UNIT) CAPS capsule; Take 1 capsule (50,000 Units total) by mouth every 7 (seven) days.  Dispense: 12 capsule; Refill: 0  3. At risk for activity intolerance Alicia Payne was given approximately 9 minutes of counseling today regarding her increased risk for exercise intolerance.  We discussed patient's specific personal and medical issues that raise our concern.  She was advised of strategies to prevent injury and ways to improve her cardiopulmonary fitness levels slowly over time.  We additionally discussed various fitness trackers and smart phone apps to help motivate the patient to stay on track.  4. Obesity with current BMI of 32.9 Alicia Payne is currently in the action stage of change. As such, her goal is to continue with weight loss efforts. She has agreed to the Category 2 Plan and keeping a food journal and adhering to recommended goals of 1400-1500 calories and 90+ grams protein.   Exercise goals:  As is  Behavioral modification strategies: increasing  lean protein intake, decreasing simple carbohydrates, and planning for success.  Alicia Payne has agreed to follow-up with our clinic in 8 weeks with fasting blood work. She was informed of the importance of frequent follow-up visits to maximize her success with intensive lifestyle modifications for her multiple health conditions.   Objective:   Blood pressure 119/66, pulse 83, temperature 97.8 F (36.6 C), height '5\' 7"'$   (1.702 m), weight 210 lb (95.3 kg), last menstrual period 11/20/2014, SpO2 98 %. Body mass index is 32.89 kg/m.  General: Cooperative, alert, well developed, in no acute distress. HEENT: Conjunctivae and lids unremarkable. Cardiovascular: Regular rhythm.  Lungs: Normal work of breathing. Neurologic: No focal deficits.   Lab Results  Component Value Date   CREATININE 0.82 08/08/2021   BUN 13 08/08/2021   NA 139 08/08/2021   K 4.5 08/08/2021   CL 101 08/08/2021   CO2 23 08/08/2021   Lab Results  Component Value Date   ALT 24 08/08/2021   AST 18 08/08/2021   ALKPHOS 65 08/08/2021   BILITOT 0.6 08/08/2021   Lab Results  Component Value Date   HGBA1C 5.6 11/23/2021   HGBA1C 5.4 08/08/2021   HGBA1C 5.8 (H) 04/20/2021   HGBA1C 5.9 (H) 10/04/2020   HGBA1C 6.0 (H) 05/10/2020   Lab Results  Component Value Date   INSULIN 12.3 08/08/2021   INSULIN 9.9 04/20/2021   INSULIN 9.1 10/04/2020   INSULIN 16.9 05/10/2020   Lab Results  Component Value Date   TSH 1.310 08/08/2021   Lab Results  Component Value Date   CHOL 252 (H) 11/23/2021   HDL 44 11/23/2021   LDLCALC 172 (H) 11/23/2021   LDLDIRECT 165 (H) 11/23/2021   TRIG 193 (H) 11/23/2021   CHOLHDL 5.7 (H) 11/23/2021   Lab Results  Component Value Date   VD25OH 57.5 11/23/2021   VD25OH 53.2 04/20/2021   VD25OH 45.8 10/04/2020   Lab Results  Component Value Date   WBC 6.0 04/20/2021   HGB 15.7 04/20/2021   HCT 46.3 04/20/2021   MCV 91 04/20/2021   PLT 204 04/20/2021    Attestation Statements:   Reviewed by clinician on day of visit: allergies, medications, problem list, medical history, surgical history, family history, social history, and previous encounter notes.  I, Kathlene November, BS, CMA, am acting as transcriptionist for [employer], DO.   I have reviewed the above documentation for accuracy and completeness, and I agree with the above. Alicia Payne, D.O.  [employer] [place] was  signed into law in 2016 which includes the topic of electronic health records.  This provides immediate access to information in MyChart.  This includes consultation notes, operative notes, office notes, lab results and pathology reports.  If you have any questions about what you read please let us know at your next visit so we can discuss your concerns and take corrective action if need be.  We are right here with you.

## 2022-04-27 ENCOUNTER — Other Ambulatory Visit (HOSPITAL_COMMUNITY): Payer: Self-pay

## 2022-05-09 ENCOUNTER — Encounter (INDEPENDENT_AMBULATORY_CARE_PROVIDER_SITE_OTHER): Payer: Self-pay

## 2022-05-16 ENCOUNTER — Ambulatory Visit: Payer: No Typology Code available for payment source | Admitting: Vascular Surgery

## 2022-06-01 IMAGING — MG MM DIGITAL SCREENING BILAT W/ TOMO AND CAD
6 of 10 series · 6 of 30 positions shown · non-contrast
Comparison: Previous exam(s).

CLINICAL DATA: Screening.

EXAM:
DIGITAL SCREENING BILATERAL MAMMOGRAM WITH TOMOSYNTHESIS AND CAD
TECHNIQUE: Bilateral screening digital craniocaudal and mediolateral oblique
mammograms were obtained. Bilateral screening digital breast
tomosynthesis was performed. The images were evaluated with
computer-aided detection.

[L CC synth-2D]
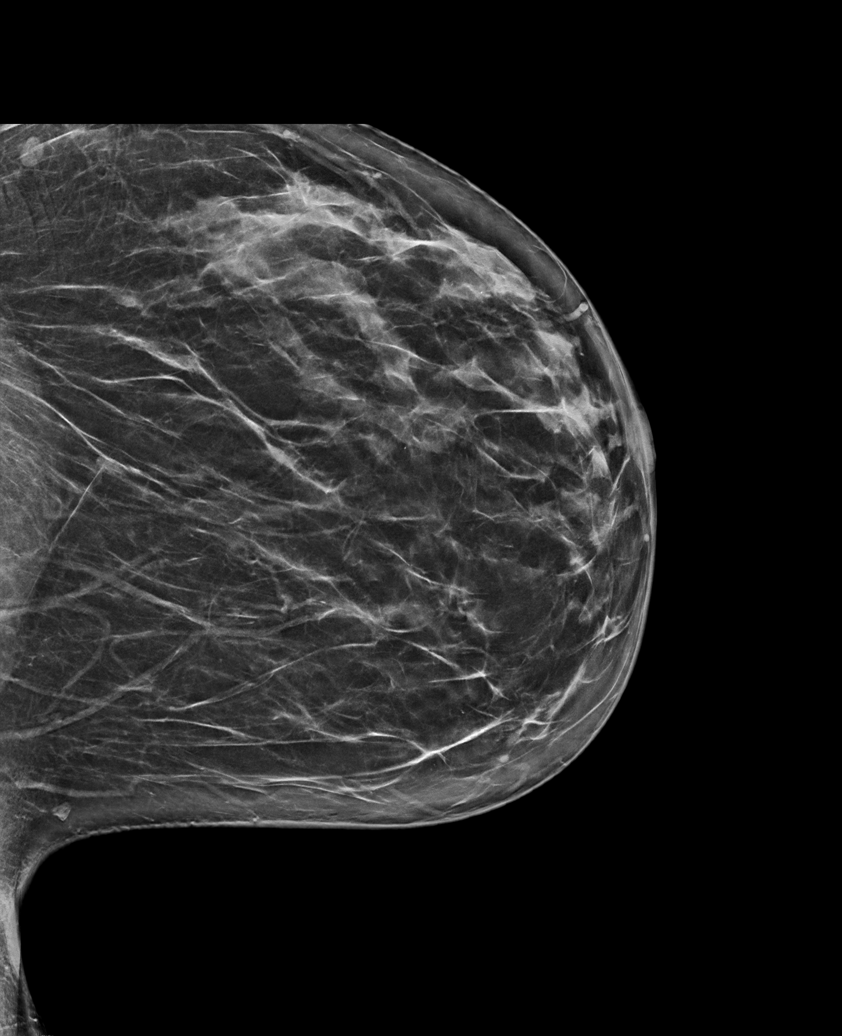

[L MLO synth-2D]
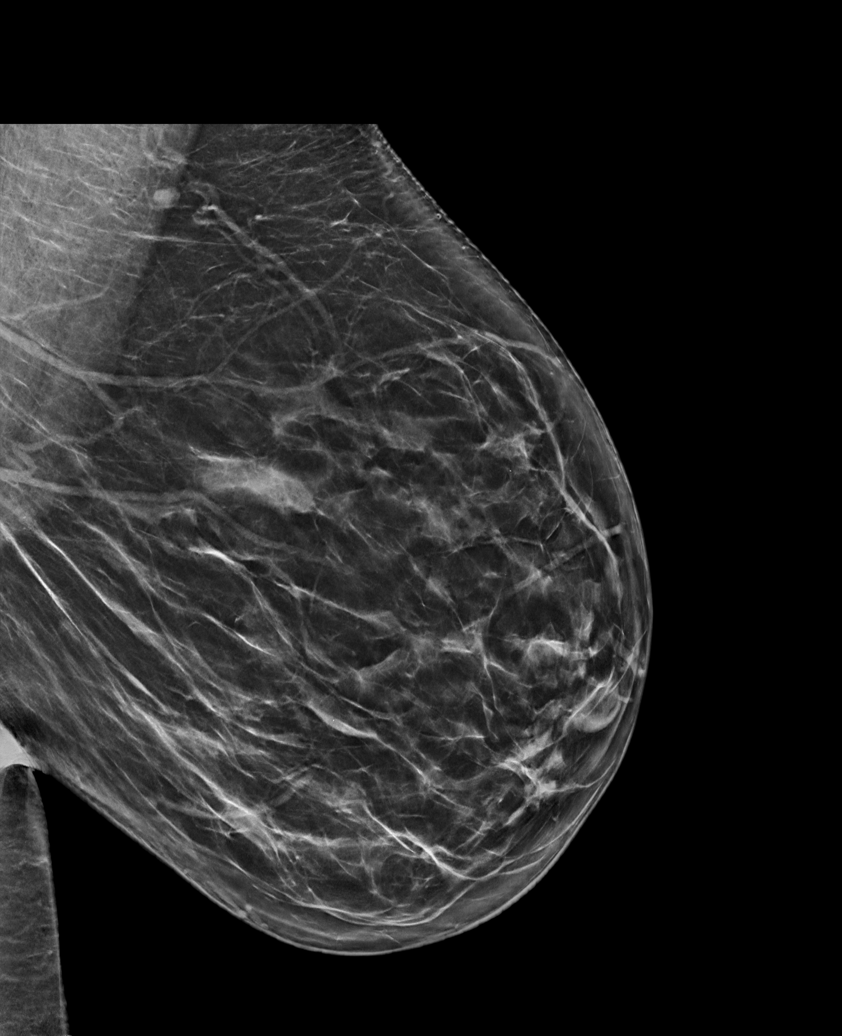

[R CC synth-2D (1 of 2)]
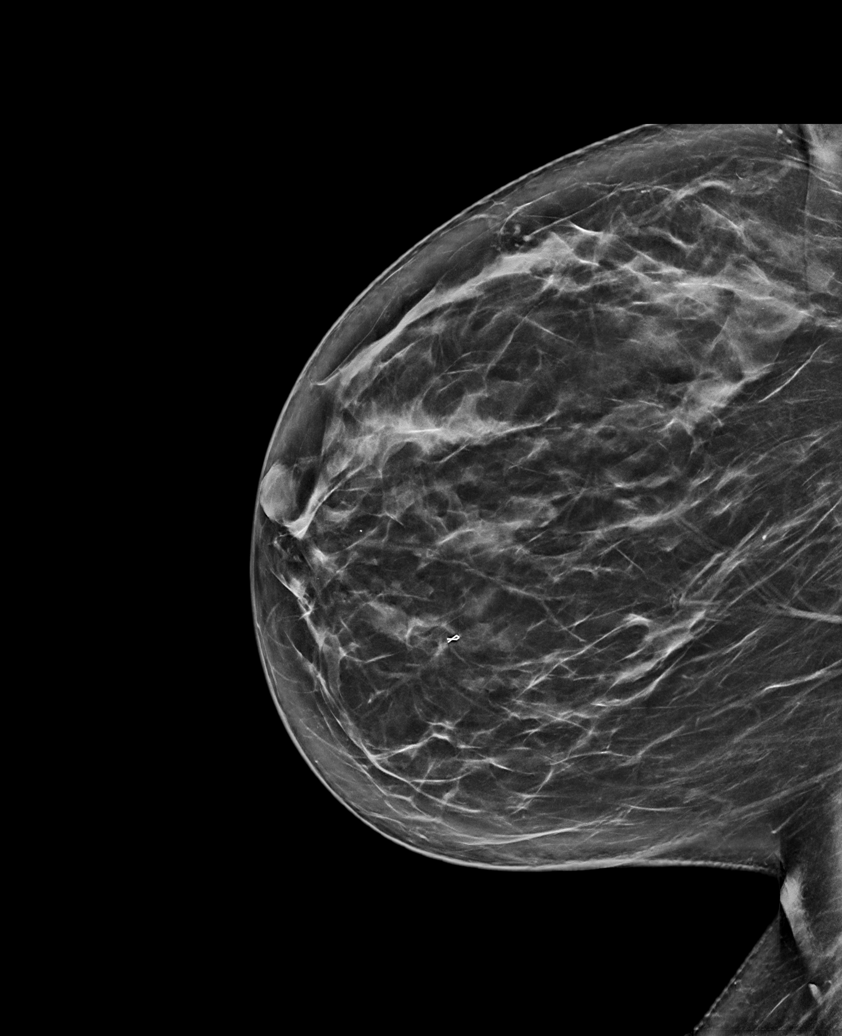

[R MLO synth-2D]
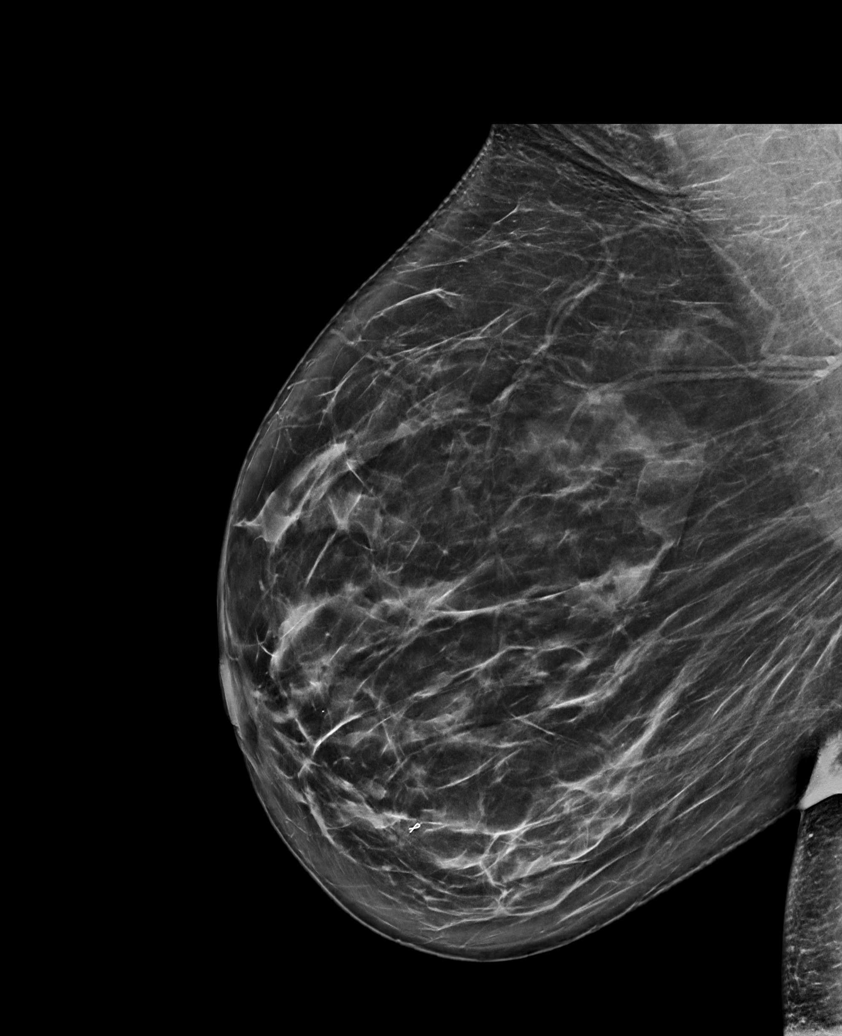

[R CC synth-2D (2 of 2)]
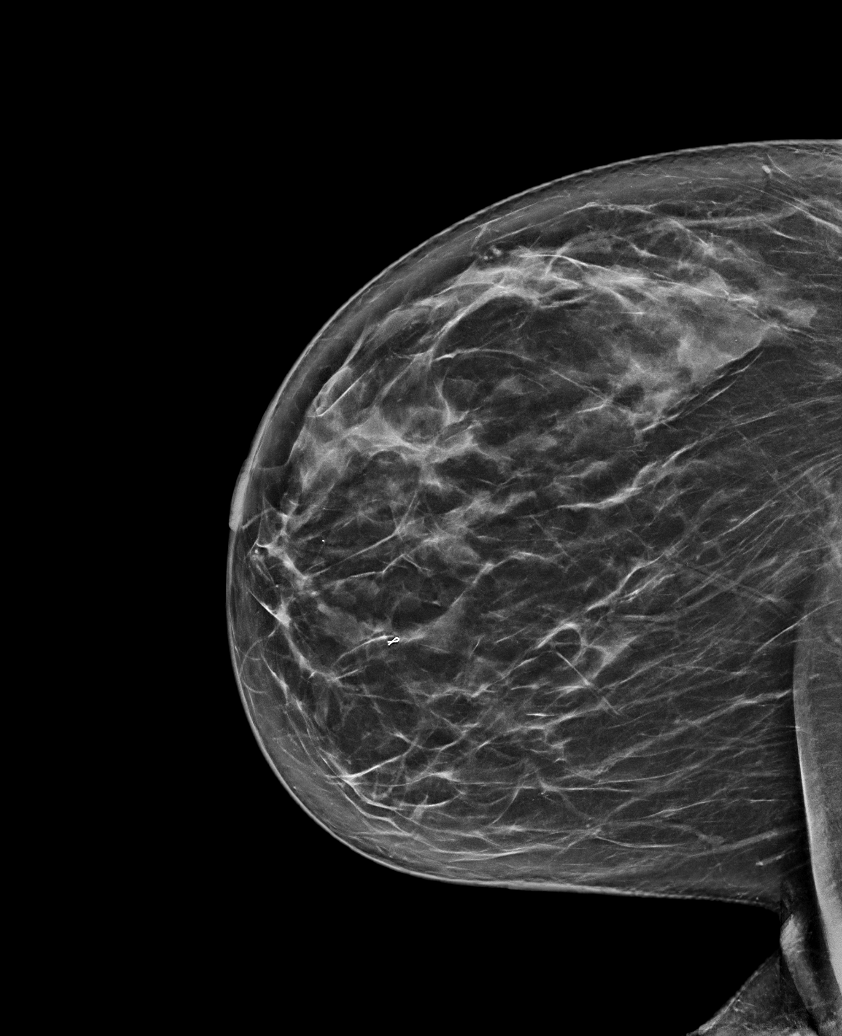

[R CC tomo · tomo slice 41/82.0]
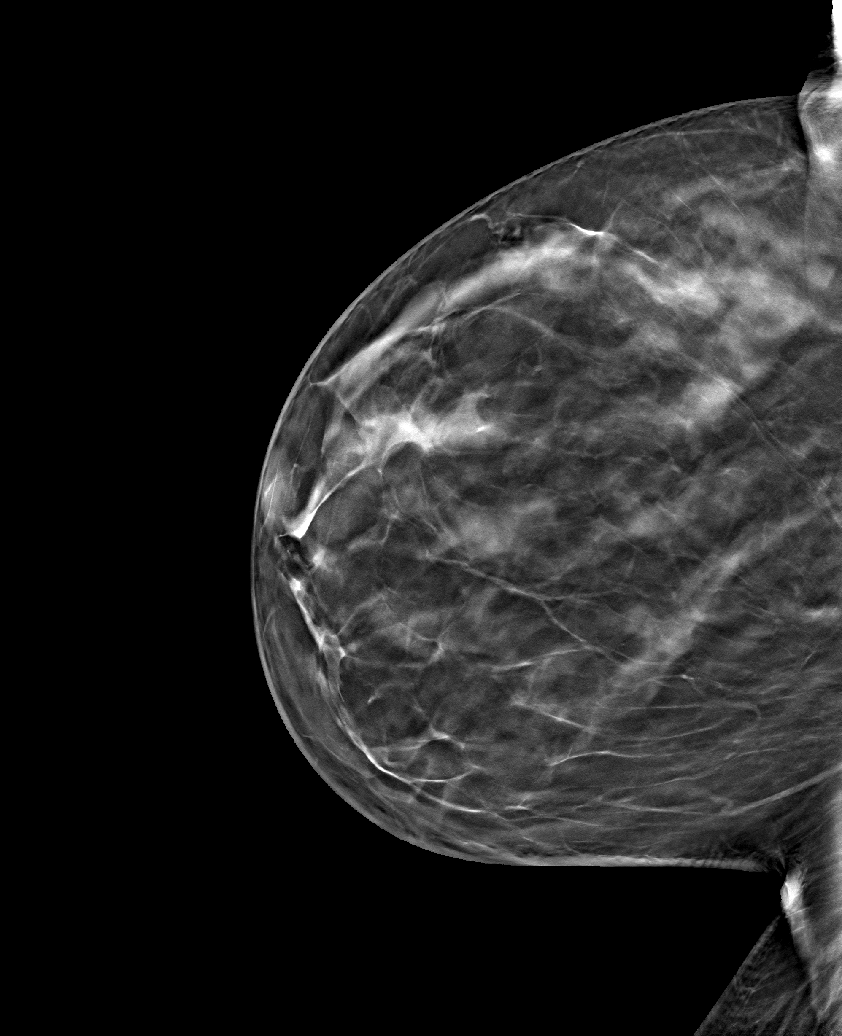

[6 of 30 positions shown; findings below may reference images not displayed]

ACR Breast Density Category c: The breast tissue is heterogeneously
dense, which may obscure small masses.
FINDINGS: There are no findings suspicious for malignancy.
IMPRESSION: No mammographic evidence of malignancy. A result letter of this
screening mammogram will be mailed directly to the patient.

RECOMMENDATION:
Screening mammogram in one year. (Code:[id number]-[id number])

BI-RADS CATEGORY  1: Negative.

## 2022-06-06 ENCOUNTER — Ambulatory Visit (INDEPENDENT_AMBULATORY_CARE_PROVIDER_SITE_OTHER): Payer: No Typology Code available for payment source | Admitting: Vascular Surgery

## 2022-06-06 ENCOUNTER — Encounter: Payer: Self-pay | Admitting: Vascular Surgery

## 2022-06-06 VITALS — BP 124/71 | HR 70 | Temp 98.4°F | Resp 14 | Ht 67.0 in | Wt 215.0 lb

## 2022-06-06 DIAGNOSIS — I83811 Varicose veins of right lower extremities with pain: Secondary | ICD-10-CM

## 2022-06-06 NOTE — Progress Notes (Signed)
REASON FOR VISIT:   Follow-up of painful varicose veins of the right lower extremity.  MEDICAL ISSUES:   PAINFUL VARICOSE VEINS RIGHT LOWER EXTREMITY: This patient has markedly enlarged varicose veins in her right thigh and leg as documented in the photographs below.  She has failed conservative treatment including thigh-high compression stockings with a gradient of 20 to 30 mmHg, leg elevation, and exercise.  I think she would be a good candidate for greater than 20 stab phlebectomies.  This would require some additional time given the extent of these varicosities.  She does have reflux in the anterior accessory saphenous vein although this is a very short segment and is markedly dilated just beyond that junction with the deep system.  For this reason I think it would be technically challenging to laser and would be associated with increased risk of DVT.  For this reason I think we can simply ligate the vein proximal to where it feeds these large varicosities as the safest approach.  We have discussed the indications for the procedure and the potential complications and she is agreeable to proceed.  If at all possible she would like to schedule this the Wednesday before Thanksgiving.  I will discuss this with Norberto Sorenson, RN.  HPI:   Alicia Payne is a pleasant 56 y.o. female who I saw with painful varicose veins in the right lower extremity on 02/07/2022.  She comes in for a 35-monthfollow-up visit.  She had markedly dilated varicose veins in the right lower extremity which are under significant pressure and causing significant symptoms.  We discussed the importance of leg elevation and the proper positioning for this.  We had her fitted for thigh-high compression stockings with a gradient of 20 to 30 mmHg.  I encouraged her to avoid prolonged sitting and standing.  We discussed the importance of exercise.  In addition she had lost 40 pounds which was very helpful.  I felt that if her  symptoms did not improve she would be a good candidate for greater than 20 stab phlebectomies of the right leg.  The anterior accessory saphenous vein on my exam appeared to be feeding these varicosities however it was a fairly short segment that could be potentially treated.  This reason I did not think she would be a good candidate for laser ablation of this short segment.  The vein here was significantly dilated and I felt that this could simply be ligated.  Since I saw her last, she continues to have pain in the thigh and leg on the right that is aggravated by standing at work.  Her symptoms are worse at the end of the day.  She has been wearing her thigh-high compression stockings.  She has been elevating her legs.  Past Medical History:  Diagnosis Date   Arthritis    Cyst of thyroid determined by ultrasound 03/10/2018   Hot flashes    Hyperlipidemia    Joint pain    Knee pain    left knee   Obesity    Phlebitis and thrombophlebitis of superficial vessels of right lower extremity 03/20/2017   Rosacea    Trouble in sleeping    Varicose vein of leg    Vitamin D deficiency     Family History  Problem Relation Age of Onset   Cancer Mother        thyroid   Thyroid cancer Mother 458  Thyroid nodules Sister  thyroidectomy   Melanoma Sister        stage 4   Colon polyps Neg Hx    Colon cancer Neg Hx    Esophageal cancer Neg Hx    Stomach cancer Neg Hx    Rectal cancer Neg Hx     SOCIAL HISTORY: Social History   Tobacco Use   Smoking status: Former    Packs/day: 0.30    Types: Cigarettes    Start date: 1984    Quit date: 1998    Years since quitting: 25.6   Smokeless tobacco: Never  Substance Use Topics   Alcohol use: Yes    Comment: social drinker    No Known Allergies  Current Outpatient Medications  Medication Sig Dispense Refill   AMBULATORY NON FORMULARY MEDICATION Knee-high, medium compression, graduated compression stockings. Apply to lower  extremities. [email or web address], Zippered Compression Stockings, large circ, long length 1 each 0   Ascorbic Acid (VITAMIN C) 1000 MG tablet Take 1,000 mg by mouth daily.     Bioflavonoid Products (BIOFLEX) TABS Take 1 tablet by mouth daily.     Black Cohosh 160 MG CAPS Take by mouth.     Calcium Citrate-Vitamin D (CALCIUM + D PO) Take 1 tablet by mouth daily.     clindamycin (CLEOCIN) 300 MG capsule Take 1 capsule (300 mg total) by mouth 3 (three) times daily until gone 30 capsule 0   diclofenac sodium (VOLTAREN) 1 % GEL Apply 4 g topically 4 (four) times daily. To affected joint. 100 g 11   Omega-3 Fatty Acids (FISH OIL PO) Take 1 capsule by mouth daily.     Vitamin D, Ergocalciferol, (DRISDOL) 1.25 MG (50000 UNIT) CAPS capsule Take 1 capsule (50,000 Units total) by mouth every 7 (seven) days. 12 capsule 0   metFORMIN (GLUCOPHAGE) 500 MG tablet Take 1 tablet (500 mg total) by mouth 2 (two) times daily with a meal. (Patient not taking: Reported on 06/06/2022) 120 tablet 0   predniSONE (STERAPRED UNI-PAK 48 TAB) 10 MG (48) TBPK tablet Take as directed for 12 days. (Patient not taking: Reported on 06/06/2022) 48 tablet 0   No current facility-administered medications for this visit.    REVIEW OF SYSTEMS:  '[X]'$  denotes positive finding, '[ ]'$  denotes negative finding Cardiac  Comments:  Chest pain or chest pressure:    Shortness of breath upon exertion:    Short of breath when lying flat:    Irregular heart rhythm:        Vascular    Pain in calf, thigh, or hip brought on by ambulation:    Pain in feet at night that wakes you up from your sleep:     Blood clot in your veins:    Leg swelling:  x       Pulmonary    Oxygen at home:    Productive cough:     Wheezing:         Neurologic    Sudden weakness in arms or legs:     Sudden numbness in arms or legs:     Sudden onset of difficulty speaking or slurred speech:    Temporary loss of vision in one eye:     Problems with dizziness:          Gastrointestinal    Blood in stool:     Vomited blood:         Genitourinary    Burning when urinating:     Blood in urine:  Psychiatric    Major depression:         Hematologic    Bleeding problems:    Problems with blood clotting too easily:        Skin    Rashes or ulcers:        Constitutional    Fever or chills:     PHYSICAL EXAM:   Vitals:   06/06/22 1550  BP: 124/71  Pulse: 70  Resp: 14  Temp: 98.4 F (36.9 C)  TempSrc: Temporal  SpO2: 97%  Weight: 215 lb (97.5 kg)  Height: '5\' 7"'$  (1.702 m)    GENERAL: The patient is a well-nourished female, in no acute distress. The vital signs are documented above. CARDIAC: There is a regular rate and rhythm.  VASCULAR: She has palpable pedal pulses. I again looked at her right anterior accessory saphenous vein.  If we were to treat this with a laser there is only about a 10-12 cm segment to treat.  The vein is significantly dilated just distal to the junction with the deep vein. She has markedly dilated varicose veins in her right thigh and right leg as documented in the photographs below.       PULMONARY: There is good air exchange bilaterally without wheezing or rales. MUSCULOSKELETAL: There are no major deformities or cyanosis. NEUROLOGIC: No focal weakness or paresthesias are detected. SKIN: There are no ulcers or rashes noted. PSYCHIATRIC: The patient has a normal affect.  DATA:    No new data  Deitra Mayo [facility] [phone]

## 2022-06-18 ENCOUNTER — Ambulatory Visit: Payer: No Typology Code available for payment source | Admitting: Dermatology

## 2022-06-19 ENCOUNTER — Other Ambulatory Visit: Payer: Self-pay | Admitting: *Deleted

## 2022-06-19 DIAGNOSIS — I83811 Varicose veins of right lower extremities with pain: Secondary | ICD-10-CM

## 2022-06-25 ENCOUNTER — Ambulatory Visit (INDEPENDENT_AMBULATORY_CARE_PROVIDER_SITE_OTHER): Payer: No Typology Code available for payment source | Admitting: Family Medicine

## 2022-06-27 ENCOUNTER — Other Ambulatory Visit: Payer: Self-pay

## 2022-07-23 ENCOUNTER — Ambulatory Visit (INDEPENDENT_AMBULATORY_CARE_PROVIDER_SITE_OTHER): Payer: No Typology Code available for payment source | Admitting: Family Medicine

## 2022-08-15 ENCOUNTER — Other Ambulatory Visit: Payer: Self-pay | Admitting: *Deleted

## 2022-08-15 MED ORDER — LORAZEPAM 1 MG PO TABS: ORAL_TABLET | ORAL | 0 refills | Status: DC

## 2022-08-16 ENCOUNTER — Other Ambulatory Visit (HOSPITAL_COMMUNITY): Payer: Self-pay

## 2022-08-16 MED ORDER — LORAZEPAM 1 MG PO TABS
1.0000 mg | ORAL_TABLET | ORAL | 0 refills | Status: DC
  Filled 2022-08-16: qty 2, 1d supply, fill #0

## 2022-08-17 ENCOUNTER — Ambulatory Visit
Admission: RE | Admit: 2022-08-17 | Discharge: 2022-08-17 | Disposition: A | Discharge status: Home / Self Care | Payer: No Typology Code available for payment source | Source: Ambulatory Visit | Attending: Emergency Medicine | Admitting: Emergency Medicine

## 2022-08-17 ENCOUNTER — Ambulatory Visit (HOSPITAL_COMMUNITY)
Admission: EM | Admit: 2022-08-17 | Discharge: 2022-08-17 | Disposition: A | Discharge status: Home / Self Care | Payer: No Typology Code available for payment source

## 2022-08-17 ENCOUNTER — Ambulatory Visit (INDEPENDENT_AMBULATORY_CARE_PROVIDER_SITE_OTHER): Payer: No Typology Code available for payment source

## 2022-08-17 ENCOUNTER — Other Ambulatory Visit (HOSPITAL_COMMUNITY): Payer: Self-pay

## 2022-08-17 VITALS — BP 134/73 | HR 63 | Temp 98.1°F | Resp 18

## 2022-08-17 DIAGNOSIS — W19XXXA Unspecified fall, initial encounter: Secondary | ICD-10-CM

## 2022-08-17 DIAGNOSIS — M1812 Unilateral primary osteoarthritis of first carpometacarpal joint, left hand: Secondary | ICD-10-CM

## 2022-08-17 DIAGNOSIS — M25532 Pain in left wrist: Secondary | ICD-10-CM | POA: Diagnosis not present

## 2022-08-17 DIAGNOSIS — M79643 Pain in unspecified hand: Secondary | ICD-10-CM

## 2022-08-17 NOTE — ED Triage Notes (Signed)
Patient states she had a fall yesterday and now has pain to her left wrist. The patient has limited ROM.   Home interventions: ibuprofen, aleve

## 2022-08-17 NOTE — Discharge Instructions (Addendum)
Unfortunately, the x-ray performed of your left wrist could not rule in or rule out fracture.  I recommend that you go to orthopedics at this time for possible cross-sectional imaging to evaluate for scaphoid fracture.  Thank you for visiting urgent care today.

## 2022-08-17 NOTE — ED Provider Notes (Signed)
[facility]    CSN: [id number] Arrival date & time: 08/17/22  1449    HISTORY   Chief Complaint  Patient presents with   Wrist Pain    Entered by patient   HPI Alicia Payne is a pleasant, 56 y.o. female who presents to urgent care today. Patient complains of falling yesterday and landing on her left wrist.  Patient states initially she did not have any pain and went on about her day.  Patient states that gradually throughout the evening she began to notice swelling, bruising, stiffness and loss of range of motion.  Patient states today that he has significant bruising on her left palm and significant swelling of all digits of her left hand.  Patient has tenderness to palpation at her thenar eminence radial styloid process and anatomic snuffbox.  Patient denies prior injury to her left wrist.    The history is provided by the patient.   Past Medical History:  Diagnosis Date   Arthritis    Cyst of thyroid determined by ultrasound 03/10/2018   Hot flashes    Hyperlipidemia    Joint pain    Knee pain    left knee   Obesity    Phlebitis and thrombophlebitis of superficial vessels of right lower extremity 03/20/2017   Rosacea    Trouble in sleeping    Varicose vein of leg    Vitamin D deficiency    Patient Active Problem List   Diagnosis Date Noted   Other Specified Feeding or Eating Disorder, Emotional Eating Behaviors 02/19/2022   Preventative health care 11/14/2021   Family history of melanoma 11/14/2021   Varicose veins of both lower extremities 11/14/2021   At risk for malnutrition 03/01/2021   At risk for depression 10/19/2020   At risk for impaired metabolic function 85/63/1497   Depression 08/22/2020   Acute reaction to stress- emotional eating 06/22/2020   Other hyperlipidemia 05/25/2020   Prediabetes 05/25/2020   At risk for diabetes mellitus 05/25/2020   Mass of right breast on mammogram 03/17/2018   Cyst of thyroid determined by  ultrasound 03/10/2018   Vitamin D deficiency 03/10/2018   Onychomycosis 03/10/2018   Menopausal vasomotor syndrome 03/10/2018   Colon cancer screening 03/10/2018   Mixed hyperlipidemia 02/06/2017   Class 1 obesity with serious comorbidity and body mass index (BMI) of 34.0 to 34.9 in adult 02/06/2017   Seasonal allergic conjunctivitis 06/28/2015   Ocular rosacea 06/28/2015   Varicose veins of leg with pain 01/14/2012   Past Surgical History:  Procedure Laterality Date   TUBAL LIGATION     UTERINE FIBROID SURGERY     VARICOSE VEIN SURGERY     OB History     Gravida  2   Para  2   Term      Preterm      AB      Living  2      SAB      IAB      Ectopic      Multiple      Live Births             Home Medications    Prior to Admission medications   Medication Sig Start Date End Date Taking? Authorizing Provider  AMBULATORY NON FORMULARY MEDICATION Knee-high, medium compression, graduated compression stockings. Apply to lower extremities. [email or web address], Zippered Compression Stockings, large circ, long length 02/11/17   Trixie Dredge, PA-C  Ascorbic Acid (VITAMIN C) 1000 MG tablet Take 1,000  mg by mouth daily.    [provider]  Bioflavonoid Products (BIOFLEX) TABS Take 1 tablet by mouth daily. 04/19/20   Debbrah Alar, NP  Black Cohosh 160 MG CAPS Take by mouth.    [provider]  Calcium Citrate-Vitamin D (CALCIUM + D PO) Take 1 tablet by mouth daily.    [provider]  diclofenac sodium (VOLTAREN) 1 % GEL Apply 4 g topically 4 (four) times daily. To affected joint. 06/17/18   Gregor Hams, MD  LORazepam (ATIVAN) 1 MG tablet Take 1 tablet 30 minutes before leaving house on day of office surgery.  Bring second tablet with you to office on day of office surgery. 08/15/22   Angelia Mould, MD  LORazepam (ATIVAN) 1 MG tablet Take 1 tablet (1 mg total) by mouth 30 minutes before leaving house on day of  office surgery. Bring 2nd tablet to office 08/15/22     metFORMIN (GLUCOPHAGE) 500 MG tablet Take 1 tablet (500 mg total) by mouth 2 (two) times daily with a meal. Patient not taking: Reported on 06/06/2022 04/23/22   Mellody Dance, DO  Omega-3 Fatty Acids (FISH OIL PO) Take 1 capsule by mouth daily.    [provider]  predniSONE (STERAPRED UNI-PAK 48 TAB) 10 MG (48) TBPK tablet Take as directed for 12 days. Patient not taking: Reported on 06/06/2022 02/23/22     Vitamin D, Ergocalciferol, (DRISDOL) 1.25 MG (50000 UNIT) CAPS capsule Take 1 capsule (50,000 Units total) by mouth every 7 (seven) days. 04/23/22   Mellody Dance, DO    Family History Family History  Problem Relation Age of Onset   Cancer Mother        thyroid   Thyroid cancer Mother 43   Thyroid nodules Sister        thyroidectomy   Melanoma Sister        stage 4   Colon polyps Neg Hx    Colon cancer Neg Hx    Esophageal cancer Neg Hx    Stomach cancer Neg Hx    Rectal cancer Neg Hx    Social History Social History   Tobacco Use   Smoking status: Former    Packs/day: 0.30    Types: Cigarettes    Start date: 1984    Quit date: 1998    Years since quitting: 25.8   Smokeless tobacco: Never  Vaping Use   Vaping Use: Never used  Substance Use Topics   Alcohol use: Yes    Comment: social drinker   Drug use: No   Allergies   Patient has no known allergies.  Review of Systems Review of Systems Pertinent findings revealed after performing a 14 point review of systems has been noted in the history of present illness.  Physical Exam Triage Vital Signs ED Triage Vitals  Enc Vitals Group     BP 07/28/21 0827 (!) 147/82     Pulse Rate 07/28/21 0827 72     Resp 07/28/21 0827 18     Temp 07/28/21 0827 98.3 F (36.8 C)     Temp Source 07/28/21 0827 Oral     SpO2 07/28/21 0827 98 %     Weight --      Height --      Head Circumference --      Peak Flow --      Pain Score 07/28/21 0826 5     Pain Loc  --      Pain Edu? --  Excl. in [place]? --    Updated Vital Signs BP 134/73 (BP Location: Right Arm)   Pulse 63   Temp 98.1 F (36.7 C) (Oral)   Resp 18   LMP 11/20/2014   SpO2 95%   Physical Exam Vitals and nursing note reviewed.  Constitutional:      General: She is awake. She is not in acute distress.    Appearance: Normal appearance. She is well-developed and well-groomed. She is not ill-appearing.  HENT:     Head: Normocephalic and atraumatic.  Eyes:     Pupils: Pupils are equal, round, and reactive to light.  Cardiovascular:     Rate and Rhythm: Normal rate and regular rhythm.  Pulmonary:     Effort: Pulmonary effort is normal.     Breath sounds: Normal breath sounds.  Musculoskeletal:     Left wrist: Swelling, tenderness, bony tenderness and snuff box tenderness present. No deformity, effusion, lacerations or crepitus. Decreased range of motion. Normal pulse.       Hands:     Cervical back: Normal range of motion and neck supple.  Skin:    General: Skin is warm and dry.  Neurological:     General: No focal deficit present.     Mental Status: She is alert and oriented to person, place, and time. Mental status is at baseline.  Psychiatric:        Mood and Affect: Mood normal.        Behavior: Behavior normal. Behavior is cooperative.        Thought Content: Thought content normal.        Judgment: Judgment normal.     [facility] / Diagnostics / Procedures:     Radiology DG Wrist Complete Left  Result Date: 08/17/2022 CLINICAL DATA:  Fall on wrist yesterday, wrist pain and limited range of motion. EXAM: LEFT WRIST - COMPLETE 3+ VIEW COMPARISON:  None Available. FINDINGS: Mild degenerative spurring at the first carpometacarpal articulation. No well-defined fracture or malalignment. No bulging of the pronator fat pad. IMPRESSION: 1. Mild degenerative spurring at the first carpometacarpal articulation. No fracture identified. If the patient's tenderness is in the  vicinity of the scaphoid/anatomic snuffbox, then cross-sectional imaging or presumptive treatment for occult scaphoid fracture might be considered. Electronically Signed   By: Van Clines M.D.   On: 08/17/2022 15:55    Procedures Procedures (including critical care time) EKG  Pending results:  Labs Reviewed - No data to display  Medications Ordered in UC: Medications - No data to display  UC Diagnoses / Final Clinical Impressions(s)   I have reviewed the triage vital signs and the nursing notes.  Pertinent labs & imaging results that were available during my care of the patient were reviewed by me and considered in my medical decision making (see chart for details).    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  ED Prescriptions   None    PDMP not reviewed this encounter.  Discharge Instructions:   Discharge Instructions      Unfortunately, the x-ray performed of your left wrist could not rule in or rule out fracture.  I recommend that you go to orthopedics at this time for  possible cross-sectional imaging to evaluate for scaphoid fracture.  Thank you for visiting urgent care today.      Disposition Upon Discharge:  Condition: stable for discharge home Home: take medications as prescribed; routine discharge instructions as discussed; follow up as advised.  Patient presented with an acute illness with associated systemic symptoms and significant discomfort requiring urgent management. In my opinion, this is a condition that a prudent lay person (someone who possesses an average knowledge of health and medicine) may potentially expect to result in  complications if not addressed urgently such as respiratory distress, impairment of bodily function or dysfunction of bodily organs.   Routine symptom specific, illness specific and/or disease specific instructions were discussed with the patient and/or caregiver at length.   As such, the patient has been evaluated and assessed, work-up was performed and treatment was provided in alignment with urgent care protocols and evidence based medicine.  Patient/parent/caregiver has been advised that the patient may require follow up for further testing and treatment if the symptoms continue in spite of treatment, as clinically indicated and appropriate.  Patient/parent/caregiver has been advised to report to orthopedic urgent care clinic or return to the [facility] or PCP in 3-5 days if no better; follow-up with orthopedics, PCP or the Emergency Department if new signs and symptoms develop or if the current signs or symptoms continue to change or worsen for further workup, evaluation and treatment as clinically indicated and appropriate  The patient will follow up with their current PCP if and as advised. If the patient does not currently have a PCP we will have assisted them in obtaining one.   The patient may need specialty follow up if the symptoms continue, in spite of conservative treatment and management, for further workup, evaluation, consultation and treatment as clinically indicated and appropriate.  Patient/parent/caregiver verbalized understanding and agreement of plan as discussed.  All questions were addressed during visit.  Please see discharge instructions below for further details of plan.  This office note has been dictated using Museum/gallery curator.  Unfortunately, this method of dictation can sometimes lead to typographical or grammatical errors.  I apologize for your inconvenience in advance if this occurs.  Please do not hesitate to reach out to me if clarification is needed.       Lynden Oxford Scales, PA-C 08/17/22 1639

## 2022-08-22 ENCOUNTER — Ambulatory Visit: Payer: No Typology Code available for payment source | Admitting: Vascular Surgery

## 2022-08-22 ENCOUNTER — Encounter: Payer: Self-pay | Admitting: Vascular Surgery

## 2022-08-22 VITALS — BP 132/73 | HR 76 | Temp 97.9°F | Resp 16 | Ht 68.0 in | Wt 215.0 lb

## 2022-08-22 DIAGNOSIS — I83811 Varicose veins of right lower extremities with pain: Secondary | ICD-10-CM

## 2022-08-22 HISTORY — PX: OTHER SURGICAL HISTORY: SHX169

## 2022-08-22 NOTE — Progress Notes (Signed)
    Stab Phlebectomy Procedure  Alicia Payne DOB:10-25-1965  08/22/2022  Consent signed: Yes  Surgeon:C. Scot Dock, MD  Procedure: stab phlebectomy: right leg  BP 132/73 (BP Location: Left Arm, Patient Position: Sitting, Cuff Size: Large)   Pulse 76   Temp 97.9 F (36.6 C) (Temporal)   Resp 16   Ht '5\' 8"'$  (1.727 m)   Wt 215 lb (97.5 kg)   LMP 11/20/2014   SpO2 98%   BMI 32.69 kg/m   Start time: 8:35 AM    End time: 10:15 AM    Tumescent Anesthesia: 750 cc 0.9% NaCl with 50 cc Lidocaine HCL with 1% Epi and 15 cc 8.4% NaHCO3  Local Anesthesia: 9 cc Lidocaine HCL and NaHCO3 (ratio 2:1)   Stab Phlebectomy: >20 Sites: Thigh and Calf  Patient tolerated procedure well: Yes  Notes: All staff members wore facial masks.  Alicia Payne took Ativan 1 mg (1 tablet) on 08-22-2022 at 7:10 AM and at 8:10 AM.    Description of Procedure:  After marking the course of the secondary varicosities, the patient was placed on the operating table in the supine position, and the right leg was prepped and draped in sterile fashion.    The patient was then put into Trendelenburg position.  Local anesthetic was administered at the previously marked varicosities, and tumescent anesthesia was administered around the vessels.  Greater than 20 stab wounds were made using the tip of an 11 blade. And using the vein hook, the phlebectomies were performed using a hemostat to avulse the varicosities.  Adequate hemostasis was achieved, and steri strips were applied to the stab wound.      ABD pads and thigh high compression stockings were applied as well ace wraps where needed. Blood loss was less than 15 cc.  The patient transported out of the operating room to her car via wheelchair to having tolerated the procedure well.

## 2022-08-22 NOTE — Progress Notes (Signed)
Patient name: Alicia Payne MRN: [id number] DOB: August 16, 1966 Sex: female  REASON FOR VISIT: For endovenous laser ablation of the right anterior accessory saphenous vein and greater than 20 stabs.  HPI: Alicia Payne is a 56 y.o. female who I last saw on 06/06/2022.  She had painful varicose veins of the right lower extremity.  She had markedly enlarged varicose veins in the right thigh and leg as documented by the photographs at that time.  She had failed conservative treatment.  I felt she would be a good candidate for greater than 20 stab phlebectomies.  She had an incompetent anterior accessory saphenous vein.  However the segment was quite short and the vein markedly dilated near the saphenofemoral junction.  I felt the safest option was to do stabs up to this level.  Current Outpatient Medications  Medication Sig Dispense Refill   AMBULATORY NON FORMULARY MEDICATION Knee-high, medium compression, graduated compression stockings. Apply to lower extremities. [email or web address], Zippered Compression Stockings, large circ, long length 1 each 0   Ascorbic Acid (VITAMIN C) 1000 MG tablet Take 1,000 mg by mouth daily.     Bioflavonoid Products (BIOFLEX) TABS Take 1 tablet by mouth daily.     Black Cohosh 160 MG CAPS Take by mouth.     Calcium Citrate-Vitamin D (CALCIUM + D PO) Take 1 tablet by mouth daily.     diclofenac sodium (VOLTAREN) 1 % GEL Apply 4 g topically 4 (four) times daily. To affected joint. 100 g 11   LORazepam (ATIVAN) 1 MG tablet Take 1 tablet 30 minutes before leaving house on day of office surgery.  Bring second tablet with you to office on day of office surgery. 2 tablet 0   LORazepam (ATIVAN) 1 MG tablet Take 1 tablet (1 mg total) by mouth 30 minutes before leaving house on day of office surgery. Bring 2nd tablet to office 2 tablet 0   Omega-3 Fatty Acids (FISH OIL PO) Take 1 capsule by mouth daily.     predniSONE (STERAPRED UNI-PAK 48 TAB) 10 MG (48)  TBPK tablet Take as directed for 12 days. 48 tablet 0   Vitamin D, Ergocalciferol, (DRISDOL) 1.25 MG (50000 UNIT) CAPS capsule Take 1 capsule (50,000 Units total) by mouth every 7 (seven) days. 12 capsule 0   metFORMIN (GLUCOPHAGE) 500 MG tablet Take 1 tablet (500 mg total) by mouth 2 (two) times daily with a meal. (Patient not taking: Reported on 06/06/2022) 120 tablet 0   No current facility-administered medications for this visit.    PHYSICAL EXAM: Vitals:   08/22/22 0822  BP: 132/73  Pulse: 76  Resp: 16  Temp: 97.9 F (36.6 C)  TempSrc: Temporal  SpO2: 98%  Weight: 215 lb (97.5 kg)  Height: '5\' 8"'$  (1.727 m)    PROCEDURE: Greater than 20 stab phlebectomies  TECHNIQUE: The patient was taken to the exam room and the dilated varicose veins were marked with the patient standing..  The patient was then placed supine.  The right leg was prepped and draped in usual sterile fashion.  Tumescent anesthesia was administered at all the marked areas.  This was done in sections as there was a very large number of varicosities in the thigh and leg.  A total of approximately 30 stab incisions were made.  The veins were grasped with a hook brought above the skin and excised with a hemostat.  Pressure was held for hemostasis.  Steri-Strips were applied.  A pressure dressing was applied.  Patient tolerated  the procedure well.  She will return in 2 weeks for follow-up visit.  Deitra Mayo [facility] [phone]

## 2022-08-31 ENCOUNTER — Telehealth: Payer: Self-pay

## 2022-08-31 NOTE — Telephone Encounter (Signed)
I spoke with patient. I advised that she should continue to wear her compression hose during the day and to remove at night, then elevate. I advised she should try to take ibuprofen to alleviate some of the discomfort she is experiencing. Patient states that she will be Flying when she heads out of town on Tuesday. I did advise that flying this soon after her procedure is not recommended but when she does I stressed the importance of wearing the compression hose and taking Ibuprofen. I also advised alternating warm and cold compresses to the site. Patient voiced her understanding.

## 2022-08-31 NOTE — Telephone Encounter (Signed)
Patient called into office stating she has been experiencing discomfort in her RLE after her procedure with Dr. Scot Dock 08/22/22. Patient stated most of her incisions have healed and feel fine. But, there are a few located on the front of her leg that is causing a "Pulling sensation" also, heaviness. She states she will be going out of town on Tuesday and would like to know what to do prior. She reports she is still wearing compression and hasn't taken and Ibuprofen past the date she had the procedure because she didn't feel it was necessary.

## 2022-09-03 ENCOUNTER — Encounter (HOSPITAL_COMMUNITY): Payer: No Typology Code available for payment source

## 2022-09-03 ENCOUNTER — Encounter: Payer: No Typology Code available for payment source | Admitting: Surgery

## 2022-09-05 ENCOUNTER — Encounter: Payer: No Typology Code available for payment source | Admitting: Vascular Surgery

## 2022-09-05 ENCOUNTER — Encounter (HOSPITAL_COMMUNITY): Payer: No Typology Code available for payment source

## 2022-09-17 ENCOUNTER — Ambulatory Visit (INDEPENDENT_AMBULATORY_CARE_PROVIDER_SITE_OTHER): Payer: No Typology Code available for payment source | Admitting: Family Medicine

## 2022-09-17 ENCOUNTER — Encounter (INDEPENDENT_AMBULATORY_CARE_PROVIDER_SITE_OTHER): Payer: Self-pay | Admitting: Family Medicine

## 2022-09-17 ENCOUNTER — Other Ambulatory Visit (HOSPITAL_COMMUNITY): Payer: Self-pay

## 2022-09-17 VITALS — BP 124/76 | HR 64 | Temp 98.2°F | Ht 67.0 in | Wt 210.8 lb

## 2022-09-17 DIAGNOSIS — E669 Obesity, unspecified: Secondary | ICD-10-CM

## 2022-09-17 DIAGNOSIS — R7303 Prediabetes: Secondary | ICD-10-CM | POA: Diagnosis not present

## 2022-09-17 DIAGNOSIS — R252 Cramp and spasm: Secondary | ICD-10-CM | POA: Diagnosis not present

## 2022-09-17 DIAGNOSIS — E559 Vitamin D deficiency, unspecified: Secondary | ICD-10-CM | POA: Diagnosis not present

## 2022-09-17 DIAGNOSIS — E66812 Obesity, class 2: Secondary | ICD-10-CM | POA: Insufficient documentation

## 2022-09-17 DIAGNOSIS — E7849 Other hyperlipidemia: Secondary | ICD-10-CM

## 2022-09-17 DIAGNOSIS — Z6833 Body mass index (BMI) 33.0-33.9, adult: Secondary | ICD-10-CM

## 2022-09-17 DIAGNOSIS — F4320 Adjustment disorder, unspecified: Secondary | ICD-10-CM

## 2022-09-17 MED ORDER — BUPROPION HCL ER (XL) 150 MG PO TB24
150.0000 mg | ORAL_TABLET | ORAL | 0 refills | Status: DC
  Filled 2022-09-17: qty 90, 90d supply, fill #0

## 2022-09-17 MED ORDER — VITAMIN D (ERGOCALCIFEROL) 1.25 MG (50000 UNIT) PO CAPS
50000.0000 [IU] | ORAL_CAPSULE | ORAL | 0 refills | Status: DC
  Filled 2022-09-17: qty 12, 84d supply, fill #0

## 2022-09-18 LAB — COMPREHENSIVE METABOLIC PANEL
ALT: 21 IU/L (ref 0–32)
AST: 14 IU/L (ref 0–40)
Albumin/Globulin Ratio: 2.1 (ref 1.2–2.2)
Albumin: 4.6 g/dL (ref 3.8–4.9)
Alkaline Phosphatase: 72 IU/L (ref 44–121)
BUN/Creatinine Ratio: 21 (ref 9–23)
BUN: 13 mg/dL (ref 6–24)
Bilirubin Total: 0.5 mg/dL (ref 0.0–1.2)
CO2: 21 mmol/L (ref 20–29)
Calcium: 9.6 mg/dL (ref 8.7–10.2)
Chloride: 104 mmol/L (ref 96–106)
Creatinine, Ser: 0.62 mg/dL (ref 0.57–1.00)
Globulin, Total: 2.2 g/dL (ref 1.5–4.5)
Glucose: 101 mg/dL — ABNORMAL HIGH (ref 70–99)
Potassium: 4.6 mmol/L (ref 3.5–5.2)
Sodium: 141 mmol/L (ref 134–144)
Total Protein: 6.8 g/dL (ref 6.0–8.5)
eGFR: 104 mL/min/{1.73_m2} (ref >59)

## 2022-09-18 LAB — LIPID PANEL
Chol/HDL Ratio: 4 ratio (ref 0.0–4.4)
Cholesterol, Total: 237 mg/dL — ABNORMAL HIGH (ref 100–199)
HDL: 59 mg/dL (ref >39)
LDL Chol Calc (NIH): 158 mg/dL — ABNORMAL HIGH (ref 0–99)
Triglycerides: 113 mg/dL (ref 0–149)
VLDL Cholesterol Cal: 20 mg/dL (ref 5–40)

## 2022-09-18 LAB — CBC WITH DIFFERENTIAL/PLATELET
Basophils Absolute: 0 10*3/uL (ref 0.0–0.2)
Basos: 0 %
EOS (ABSOLUTE): 0.1 10*3/uL (ref 0.0–0.4)
Eos: 1 %
Hematocrit: 45.6 % (ref 34.0–46.6)
Hemoglobin: 15 g/dL (ref 11.1–15.9)
Immature Grans (Abs): 0 10*3/uL (ref 0.0–0.1)
Immature Granulocytes: 0 %
Lymphocytes Absolute: 1 10*3/uL (ref 0.7–3.1)
Lymphs: 14 %
MCH: 30.4 pg (ref 26.6–33.0)
MCHC: 32.9 g/dL (ref 31.5–35.7)
MCV: 92 fL (ref 79–97)
Monocytes Absolute: 0.4 10*3/uL (ref 0.1–0.9)
Monocytes: 6 %
Neutrophils Absolute: 5.6 10*3/uL (ref 1.4–7.0)
Neutrophils: 79 %
Platelets: 217 10*3/uL (ref 150–450)
RBC: 4.94 x10E6/uL (ref 3.77–5.28)
RDW: 12.6 % (ref 11.7–15.4)
WBC: 7.1 10*3/uL (ref 3.4–10.8)

## 2022-09-18 LAB — INSULIN, RANDOM: INSULIN: 9 u[IU]/mL (ref 2.6–24.9)

## 2022-09-18 LAB — VITAMIN B12: Vitamin B-12: 820 pg/mL (ref 232–1245)

## 2022-09-18 LAB — MAGNESIUM: Magnesium: 2 mg/dL (ref 1.6–2.3)

## 2022-09-18 LAB — HEMOGLOBIN A1C
Est. average glucose Bld gHb Est-mCnc: 123 mg/dL
Hgb A1c MFr Bld: 5.9 % — ABNORMAL HIGH (ref 4.8–5.6)

## 2022-09-18 LAB — VITAMIN D 25 HYDROXY (VIT D DEFICIENCY, FRACTURES): Vit D, 25-Hydroxy: 34.6 ng/mL (ref 30.0–100.0)

## 2022-09-18 LAB — PHOSPHORUS: Phosphorus: 3.2 mg/dL (ref 3.0–4.3)

## 2022-09-18 LAB — TSH: TSH: 0.74 u[IU]/mL (ref 0.450–4.500)

## 2022-09-18 LAB — T4, FREE: Free T4: 1.22 ng/dL (ref 0.82–1.77)

## 2022-09-27 ENCOUNTER — Encounter: Payer: Self-pay | Admitting: Vascular Surgery

## 2022-09-27 ENCOUNTER — Ambulatory Visit (INDEPENDENT_AMBULATORY_CARE_PROVIDER_SITE_OTHER): Payer: No Typology Code available for payment source | Admitting: Vascular Surgery

## 2022-09-27 VITALS — BP 118/72 | HR 68 | Temp 98.2°F | Resp 20 | Ht 67.0 in | Wt 217.0 lb

## 2022-09-27 DIAGNOSIS — I83811 Varicose veins of right lower extremities with pain: Secondary | ICD-10-CM

## 2022-09-27 NOTE — Progress Notes (Signed)
   Patient name: Alicia Payne MRN: [id number] DOB: 1965/12/18 Sex: female  REASON FOR VISIT:   Follow-up after stab phlebectomies on 08/22/2022.  HPI:   [name] is a pleasant 56 y.o. female who underwent greater than 20 stab phlebectomies in the right lower extremity on 08/22/2022.  Of note she had an incompetent anterior accessory saphenous vein on the right but this was a very short segment and I did not think it was amenable to laser ablation.  Since I saw her last she has done well.  Her Steri-Strips are off now.  She did fly to [place] 2 weeks after her procedure but tolerated this fine.  Current Outpatient Medications  Medication Sig Dispense Refill   AMBULATORY NON FORMULARY MEDICATION Knee-high, medium compression, graduated compression stockings. Apply to lower extremities. [email or web address], Zippered Compression Stockings, large circ, long length 1 each 0   Ascorbic Acid (VITAMIN C) 1000 MG tablet Take 1,000 mg by mouth daily.     Bioflavonoid Products (BIOFLEX) TABS Take 1 tablet by mouth daily.     Black Cohosh 160 MG CAPS Take by mouth.     buPROPion (WELLBUTRIN XL) 150 MG 24 hr tablet Take 1 tablet (150 mg total) by mouth every morning. 90 tablet 0   Calcium Citrate-Vitamin D (CALCIUM + D PO) Take 1 tablet by mouth daily.     diclofenac sodium (VOLTAREN) 1 % GEL Apply 4 g topically 4 (four) times daily. To affected joint. 100 g 11   Omega-3 Fatty Acids (FISH OIL PO) Take 1 capsule by mouth daily.     Vitamin D, Ergocalciferol, (DRISDOL) 1.25 MG (50000 UNIT) CAPS capsule Take 1 capsule (50,000 Units total) by mouth every 7 (seven) days. 12 capsule 0   No current facility-administered medications for this visit.    REVIEW OF SYSTEMS:  '[X]'$  denotes positive finding, '[ ]'$  denotes negative finding Vascular    Leg swelling    Cardiac    Chest pain or chest pressure:    Shortness of breath upon exertion:    Short of breath when lying flat:     Irregular heart rhythm:    Constitutional    Fever or chills:     PHYSICAL EXAM:   Vitals:   09/27/22 0957  BP: 118/72  Pulse: 68  Resp: 20  Temp: 98.2 F (36.8 C)  SpO2: 96%  Weight: 217 lb (98.4 kg)  Height: '5\' 7"'$  (1.702 m)    GENERAL: The patient is a well-nourished female, in no acute distress. The vital signs are documented above. CARDIOVASCULAR: There is a regular rate and rhythm. PULMONARY: There is good air exchange bilaterally without wheezing or rales. VASCULAR: Incisions are all healing nicely.  DATA:   No new data  MEDICAL ISSUES:   S/P STAB PHLEBECTOMIES RIGHT LOWER EXTREMITY: Her stabs are all healed nicely.  She has minimal leg swelling.  She has minimal bruising.  I encouraged her to continue to elevate her legs daily.  We discussed importance of exercise and compression therapy.  I will see her back as needed.  Deitra Mayo [facility] [place] [phone]

## 2022-10-03 NOTE — Progress Notes (Signed)
Chief Complaint:   OBESITY Alicia Payne is here to discuss her progress with her obesity treatment plan along with follow-up of her obesity related diagnoses. [name] is on the Category 2 Plan and keeping a food journal and adhering to recommended goals of 1400-1500 calories and 90+ protein and states she is following her eating plan approximately 50% of the time. Alicia Payne states she is exercising.  Today's visit was #: 23 Starting weight: 250 LBS Starting date: 05/10/2020 Today's weight: 210 LBS Today's date: 09/17/2022 Total lbs lost to date: 40 LBS Total lbs lost since last in-office visit: 0  Interim History: Patient is eating breakfast and lunch on plan.  She is intentionally still snacking after dinner.  Just came back from home and after her sister died.  Subjective:   1. Pre-diabetes Patient has not been using metformin for several months.  Does not feels she needs it.  Patient denies hunger or cravings.  2. Vitamin D deficiency Alicia Payne is tolerating medication(s) well without side effects.  Medication compliance is good as patient endorses taking it as prescribed.  Symptoms are stable and the patient denies additional concerns regarding this condition.     3. Other hyperlipidemia Patient is not taking any medications.  He had patient historically has had an increased LDL.  She has been following PNP with low salt and low trans fat.  4. Leg cramping Positive for cramping especially when she is tired, at night or with more long periods of sitting.  Positive history of varicose vein stripping T-2 months ago.  5. Adjustment disorder, unspecified type Patient has used Wellbutrin in the past when her husband died 2 to 3 months ago, it worked well.  Positive for sadness, no energy, not exercising yet.  No suicidal ideations.  Patient has 2 daughters.    Assessment/Plan:   Orders Placed This Encounter  Procedures   VITAMIN D 25 Hydroxy (Vit-D Deficiency,  Fractures)   T4, free   TSH   Lipid panel   Insulin, random   Hemoglobin A1c   Comprehensive metabolic panel   CBC with Differential/Platelet   Phosphorus   Magnesium   Vitamin B12    Medications Discontinued During This Encounter  Medication Reason   metFORMIN (GLUCOPHAGE) 500 MG tablet    Vitamin D, Ergocalciferol, (DRISDOL) 1.25 MG (50000 UNIT) CAPS capsule Reorder     Meds ordered this encounter  Medications   Vitamin D, Ergocalciferol, (DRISDOL) 1.25 MG (50000 UNIT) CAPS capsule    Sig: Take 1 capsule (50,000 Units total) by mouth every 7 (seven) days.    Dispense:  12 capsule    Refill:  0   buPROPion (WELLBUTRIN XL) 150 MG 24 hr tablet    Sig: Take 1 tablet (150 mg total) by mouth every morning.    Dispense:  90 tablet    Refill:  0     1. Pre-diabetes Check labs today.  Continue PNP on weight loss.  - Insulin, random - Hemoglobin A1c - CBC with Differential/Platelet - Vitamin B12  2. Vitamin D deficiency Check labs today.  Counseling has been done.  Refill- Vitamin D, Ergocalciferol, (DRISDOL) 1.25 MG (50000 UNIT) CAPS capsule; Take 1 capsule (50,000 Units total) by mouth every 7 (seven) days.  Dispense: 12 capsule; Refill: 0  - VITAMIN D 25 Hydroxy (Vit-D Deficiency, Fractures)  3. Other hyperlipidemia Check labs today.  - Lipid panel - Comprehensive metabolic panel - CBC with Differential/Platelet  4. Leg cramping Increase hydration and  water intake.  Check labs today.  - T4, free - TSH - Comprehensive metabolic panel - CBC with Differential/Platelet - Phosphorus - Magnesium - Vitamin B12  5. Adjustment disorder, unspecified type Restart Wellbutrin, increase exercise.  Patient declines counseling has great support system.  Restart- buPROPion (WELLBUTRIN XL) 150 MG 24 hr tablet; Take 1 tablet (150 mg total) by mouth every morning.  Dispense: 90 tablet; Refill: 0  6. Obesity with current BMI of 33.0 Increase exercise to help with  mood.  Alicia Payne is currently in the action stage of change. As such, her goal is to continue with weight loss efforts. She has agreed to the Category 2 Plan or keeping a food journal and adhering to recommended goals of 1400-1500 calories and 90+ protein.   Exercise goals: All adults should avoid inactivity. Some physical activity is better than none, and adults who participate in any amount of physical activity gain some health benefits.  Behavioral modification strategies: holiday eating strategies , avoiding temptations, and planning for success.  Alicia Payne has agreed to follow-up with our clinic in 8 weeks. She was informed of the importance of frequent follow-up visits to maximize her success with intensive lifestyle modifications for her multiple health conditions.   Alicia Payne was informed we would discuss her lab results at her next visit unless there is a critical issue that needs to be addressed sooner. Alicia Payne agreed to keep her next visit at the agreed upon time to discuss these results.  Objective:   Blood pressure 124/76, pulse 64, temperature 98.2 F (36.8 C), height '5\' 7"'$  (1.702 m), weight 210 lb 12.8 oz (95.6 kg), last menstrual period 11/20/2014, SpO2 98 %. Body mass index is 33.02 kg/m.  General: Cooperative, alert, well developed, in no acute distress. HEENT: Conjunctivae and lids unremarkable. Cardiovascular: Regular rhythm.  Lungs: Normal work of breathing. Neurologic: No focal deficits.   Lab Results  Component Value Date   CREATININE 0.62 09/17/2022   BUN 13 09/17/2022   NA 141 09/17/2022   K 4.6 09/17/2022   CL 104 09/17/2022   CO2 21 09/17/2022   Lab Results  Component Value Date   ALT 21 09/17/2022   AST 14 09/17/2022   ALKPHOS 72 09/17/2022   BILITOT 0.5 09/17/2022   Lab Results  Component Value Date   HGBA1C 5.9 (H) 09/17/2022   HGBA1C 5.6 11/23/2021   HGBA1C 5.4 08/08/2021   HGBA1C 5.8 (H) 04/20/2021   HGBA1C 5.9 (H) 10/04/2020   Lab  Results  Component Value Date   INSULIN 9.0 09/17/2022   INSULIN 12.3 08/08/2021   INSULIN 9.9 04/20/2021   INSULIN 9.1 10/04/2020   INSULIN 16.9 05/10/2020   Lab Results  Component Value Date   TSH 0.740 09/17/2022   Lab Results  Component Value Date   CHOL 237 (H) 09/17/2022   HDL 59 09/17/2022   LDLCALC 158 (H) 09/17/2022   LDLDIRECT 165 (H) 11/23/2021   TRIG 113 09/17/2022   CHOLHDL 4.0 09/17/2022   Lab Results  Component Value Date   VD25OH 34.6 09/17/2022   VD25OH 57.5 11/23/2021   VD25OH 53.2 04/20/2021   Lab Results  Component Value Date   WBC 7.1 09/17/2022   HGB 15.0 09/17/2022   HCT 45.6 09/17/2022   MCV 92 09/17/2022   PLT 217 09/17/2022   No results found for: "IRON", "TIBC", "FERRITIN"  Attestation Statements:   Reviewed by clinician on day of visit: allergies, medications, problem list, medical history, surgical history, family history, social history, and  previous encounter notes.  I, Davy Pique, RMA, am acting as Location manager for [employer], DO.  I have reviewed the above documentation for accuracy and completeness, and I agree with the above. Marjory Sneddon, D.O.  The [place] was signed into law in 2016 which includes the topic of electronic health records.  This provides immediate access to information in MyChart.  This includes consultation notes, operative notes, office notes, lab results and pathology reports.  If you have any questions about what you read please let us know at your next visit so we can discuss your concerns and take corrective action if need be.  We are right here with you.'

## 2022-10-16 ENCOUNTER — Ambulatory Visit: Payer: 59 | Admitting: Sports Medicine

## 2022-10-23 ENCOUNTER — Ambulatory Visit
Admission: RE | Admit: 2022-10-23 | Discharge: 2022-10-23 | Disposition: A | Discharge status: Home / Self Care | Payer: 59 | Source: Ambulatory Visit | Attending: Sports Medicine | Admitting: Sports Medicine

## 2022-10-23 ENCOUNTER — Ambulatory Visit: Payer: 59 | Admitting: Sports Medicine

## 2022-10-23 VITALS — BP 130/68 | Ht 68.0 in | Wt 215.0 lb

## 2022-10-23 DIAGNOSIS — M25552 Pain in left hip: Secondary | ICD-10-CM | POA: Diagnosis not present

## 2022-10-23 DIAGNOSIS — R531 Weakness: Secondary | ICD-10-CM | POA: Diagnosis not present

## 2022-10-23 NOTE — Progress Notes (Addendum)
   New Patient Office Visit Subjective   Patient ID: Alicia Payne, female    DOB: [birth year]  Age: 57 y.o. MRN: [id number]  CC: Left hip pain   HPI: Alicia Payne is a 57 year old female who presents with 2-3 months of left hip pain. She denies any inciting injury and feels like her pain has become more noticeable. She describes feeling like "something is not fitting right." She identifies the pain primarily at the posterolateral hip at her gluteus medius. The pain is worst when getting up from a sitting position or going up stairs. When she gets up from sitting she limps and then with more walking improves. She denies numbness or tingling. The pain does not radiate. She has not had any prior hip issues. She works as a Psychologist, educational at [employer].  She denies groin pain.   Objective:    BP 130/68   Ht '5\' 8"'$  (1.727 m)   Wt 215 lb (97.5 kg)   LMP 11/20/2014   BMI 32.69 kg/m  Vital signs reviewed.   Physical Exam  Left Hip: Walks with a limp initially. Nontender to palpation anteriorly or posteriorly. Full ROM. Strength 5/5 with flexion, 4/5 with abduction. Negative FAbER. Negative straight leg raise test. Positive Trendelenburg on right and left hip, more notable when testing left hip.    Assessment & Plan:   Problem List Items Addressed This Visit       Other   Left hip pain - Primary    Patient with 2-3 months of posterolateral left hip pain. Weakness with hip abduction on exam and positive Trendelenburg. Will obtain hip XR as she describes feeling like "something is not fitting right." If XR is normal, will refer to PT for hip abduction strengthening. Follow-up in 6 weeks.       Relevant Orders   DG HIP UNILAT WITH PELVIS 2-3 VIEWS LEFT   Ambulatory referral to Physical Therapy   Sabino Dick, MS4 [facility]  Patient seen and evaluated with the medical student.  I agree with the above plan of care.  Will order an x-ray of her left hip and I  will follow-up with her with those results when available.  If unremarkable, then refer to physical therapy for hip strengthening with subsequent follow-up with me 6 weeks later.  This note was dictated using Dragon naturally speaking software and may contain errors in syntax, spelling, or content which have not been identified prior to signing this note.   Addendum: X-ray of the left hip is unremarkable

## 2022-10-23 NOTE — Patient Instructions (Signed)
Please get the xray at your earliest convenience. Start Physical Therapy. Follow up with [place] 6 weeks after you start PT.

## 2022-10-23 NOTE — Assessment & Plan Note (Signed)
Patient with 2-3 months of posterolateral left hip pain. Weakness with hip abduction on exam and positive Trendelenburg. Will obtain hip XR as she describes feeling like "something is not fitting right." If XR is normal, will refer to PT for hip abduction strengthening. Follow-up in 6 weeks.

## 2022-10-26 ENCOUNTER — Encounter: Payer: Self-pay | Admitting: Sports Medicine

## 2022-11-07 ENCOUNTER — Ambulatory Visit: Payer: 59 | Attending: Sports Medicine | Admitting: Physical Therapy

## 2022-11-07 ENCOUNTER — Encounter: Payer: Self-pay | Admitting: Physical Therapy

## 2022-11-07 ENCOUNTER — Other Ambulatory Visit: Payer: Self-pay

## 2022-11-07 DIAGNOSIS — M6281 Muscle weakness (generalized): Secondary | ICD-10-CM

## 2022-11-07 DIAGNOSIS — M25552 Pain in left hip: Secondary | ICD-10-CM | POA: Diagnosis not present

## 2022-11-07 NOTE — Therapy (Signed)
OUTPATIENT PHYSICAL THERAPY EVALUATION   Patient Name: Alicia Payne MRN: [id number] DOB:Nov 28, 1965, 57 y.o., female Today's Date: 11/07/2022  END OF SESSION:  PT End of Session - 11/07/22 1546     Visit Number 1    Number of Visits 9    Date for PT Re-Evaluation 01/02/23    Authorization Type [facility]OC[facility]    PT Start Time 1530    PT Stop Time 5176    PT Time Calculation (min) 45 min    Activity Tolerance Patient tolerated treatment well    Behavior During Therapy WFL for tasks assessed/performed             Past Medical History:  Diagnosis Date   Arthritis    Cyst of thyroid determined by ultrasound 03/10/2018   Hot flashes    Hyperlipidemia    Joint pain    Knee pain    left knee   Obesity    Phlebitis and thrombophlebitis of superficial vessels of right lower extremity 03/20/2017   Rosacea    Trouble in sleeping    Varicose vein of leg    Vitamin D deficiency    Past Surgical History:  Procedure Laterality Date   stab phlebectomy Right 08/22/2022   stab phlebectomy > 20 incisions right leg by Gae Gallop MD   [place]     Patient Active Problem List   Diagnosis Date Noted   Left hip pain 10/23/2022   Class 2 severe obesity with serious comorbidity and body mass index (BMI) of 38.0 to 38.9 in adult ([place]) 09/17/2022   Leg cramping 09/17/2022   Other Specified Feeding or Eating Disorder, Emotional Eating Behaviors 02/19/2022   Preventative health care 11/14/2021   Family history of melanoma 11/14/2021   Varicose veins of both lower extremities 11/14/2021   At risk for malnutrition 03/01/2021   At risk for depression 10/19/2020   At risk for impaired metabolic function 16/03/3709   Depression 08/22/2020   Acute reaction to stress- emotional eating 06/22/2020   Other hyperlipidemia 05/25/2020   Prediabetes 05/25/2020   At risk for diabetes mellitus 05/25/2020   Mass of right breast on  mammogram 03/17/2018   Cyst of thyroid determined by ultrasound 03/10/2018   Vitamin D deficiency 03/10/2018   Onychomycosis 03/10/2018   Menopausal vasomotor syndrome 03/10/2018   Colon cancer screening 03/10/2018   Mixed hyperlipidemia 02/06/2017   Class 1 obesity with serious comorbidity and body mass index (BMI) of 34.0 to 34.9 in adult 02/06/2017   Seasonal allergic conjunctivitis 06/28/2015   Ocular rosacea 06/28/2015   Varicose veins of leg with pain 01/14/2012    PCP: Debbrah Alar, NP  REFERRING PROVIDER: Thurman Coyer, DO  REFERRING DIAG: Left hip pain  THERAPY DIAG:  Pain in left hip  Muscle weakness (generalized)  Rationale for Evaluation and Treatment: Rehabilitation  ONSET DATE: about 3 months ago   SUBJECTIVE:  SUBJECTIVE STATEMENT: Patient reports about 3 months her left hip started hurting gradually, after she had been sitting for longer periods she would have to take a moment to take a step due to pain. It felt like something wasn't fitting right in the left hip. The pain would last for longer and longer after she would get up from sitting. Then one night she was tossing in turning in bed and she felt a pop in her left hip and now her pain is improving. She does  still have some pain but can go longer without the pain. One she gets going then walking is fine, but she does still have trouble going up the stairs. She feels like she has to have her foot at a certain angle to not put all her weight on it. Lying on the left side can cause some soreness on the outside of the hip. She does have tenderness to the outside of the left hip. She has had some occasional lower back pain for years, mainly occurs if she is standing or doing housework for longer periods.  PERTINENT HISTORY: None  PAIN:  Are you having pain? Yes:  NPRS scale: 0/10 (4-5/10 at worst) Pain location: Left hip  Pain description: "feels like hip is not fitting right" Aggravating factors:  when sitting for extended periods and then going to stand, going up stairs, lying on left side Relieving factors: moving/walking, ibuprofen or aleve  PRECAUTIONS: None  WEIGHT BEARING RESTRICTIONS: No  FALLS:  Has patient fallen in last 6 months? Yes. Number of falls 1, tripped  OCCUPATION: Nuclear medicine tech at [employer]   PLOF: Independent  PATIENT GOALS: Get rid of pain   OBJECTIVE:  PATIENT SURVEYS:  FOTO ***  COGNITION: Overall cognitive status: Within functional limits for tasks assessed     SENSATION: WFL  MUSCLE LENGTH: ***  POSTURE:   ***  PALPATION: ***  LOWER EXTREMITY ROM:  {AROM/PROM:27142} ROM Right eval Left eval  Hip flexion    Hip extension    Hip abduction    Hip adduction    Hip internal rotation    Hip external rotation    Knee flexion    Knee extension    Ankle dorsiflexion    Ankle plantarflexion    Ankle inversion    Ankle eversion     (Blank rows = not tested)  LOWER EXTREMITY MMT:  MMT Right eval Left eval  Hip flexion 4 4  Hip extension    Hip abduction    Hip adduction    Hip internal rotation 5 4  Hip external rotation 5 5  Knee flexion 5 5  Knee extension 5 5  Ankle dorsiflexion    Ankle plantarflexion    Ankle inversion    Ankle eversion     (Blank rows = not tested)  LOWER EXTREMITY SPECIAL TESTS:  {LEspecialtests:26242}  FUNCTIONAL TESTS:  {Functional tests:24029}  GAIT: Distance walked: *** Assistive device utilized: {Assistive devices:23999} Level of assistance: {Levels of assistance:24026} Comments: ***   TODAY'S TREATMENT:            [facility] Adult PT Treatment:                                                DATE: 11/07/2022 Therapeutic Exercise: ***  PATIENT EDUCATION:  Education details: Exam findings, POC, HEP Person educated: Patient Education method: Explanation, Demonstration, Tactile cues, Verbal cues, and Handouts Education comprehension: verbalized  understanding, returned demonstration, verbal cues required, tactile cues required, and needs further education  HOME EXERCISE PROGRAM: ***   ASSESSMENT: CLINICAL IMPRESSION: Patient is a 57 y.o. female who was seen today for physical therapy evaluation and treatment for ***.   OBJECTIVE IMPAIRMENTS: {opptimpairments:25111}.   ACTIVITY LIMITATIONS: {activitylimitations:27494}  PARTICIPATION LIMITATIONS: {participationrestrictions:25113}  PERSONAL FACTORS: {Personal factors:25162} are also affecting patient's functional outcome.   REHAB POTENTIAL: {rehabpotential:25112}  CLINICAL DECISION MAKING: {clinical decision making:25114}  EVALUATION COMPLEXITY: {Evaluation complexity:25115}   GOALS: Goals reviewed with patient? {yes/no:20286}  SHORT TERM GOALS: Target date: ***  *** Baseline: Goal status: INITIAL  2.  *** Baseline:  Goal status: INITIAL  3.  *** Baseline:  Goal status: INITIAL  LONG TERM GOALS: Target date: ***  Patient will be I with final HEP to maintain progress from PT. Baseline:  Goal status: INITIAL  2.  *** Baseline:  Goal status: INITIAL  3.  *** Baseline:  Goal status: INITIAL  4.  *** Baseline:  Goal status: INITIAL   PLAN: PT FREQUENCY: {rehab frequency:25116}  PT DURATION: {rehab duration:25117}  PLANNED INTERVENTIONS: {rehab planned interventions:25118::"Therapeutic exercises","Therapeutic activity","Neuromuscular re-education","Balance training","Gait training","Patient/Family education","Self Care","Joint mobilization"}  PLAN FOR NEXT SESSION: ***   Hilda Blades, PT, DPT, LAT, ATC 11/07/22  3:48 PM Phone: [phone] Fax: [phone]

## 2022-11-07 NOTE — Patient Instructions (Signed)
Access Code: [email or web address] URL: [email or web address] Date: 11/07/2022 Prepared by: Hilda Blades  Exercises - Clam with Resistance  - 1 x daily - 3-4 x weekly - 3 sets - 10 reps - Bridge  - 1 x daily - 3-4 x weekly - 3 sets - 10 reps - 2-3 seconds hold

## 2022-11-13 ENCOUNTER — Ambulatory Visit: Payer: 59

## 2022-11-13 NOTE — Therapy (Incomplete)
OUTPATIENT PHYSICAL THERAPY TREATMENT NOTE   Patient Name: Alicia Payne MRN: [id number] DOB:1966/09/16, 57 y.o., female Today's Date: 11/13/2022  PCP: Debbrah Alar, NP  REFERRING PROVIDER: Thurman Coyer, DO   END OF SESSION:    Past Medical History:  Diagnosis Date   Arthritis    Cyst of thyroid determined by ultrasound 03/10/2018   Hot flashes    Hyperlipidemia    Joint pain    Knee pain    left knee   Obesity    Phlebitis and thrombophlebitis of superficial vessels of right lower extremity 03/20/2017   Rosacea    Trouble in sleeping    Varicose vein of leg    Vitamin D deficiency    Past Surgical History:  Procedure Laterality Date   stab phlebectomy Right 08/22/2022   stab phlebectomy > 20 incisions right leg by Gae Gallop MD   [place]     Patient Active Problem List   Diagnosis Date Noted   Left hip pain 10/23/2022   Class 2 severe obesity with serious comorbidity and body mass index (BMI) of 38.0 to 38.9 in adult ([place]) 09/17/2022   Leg cramping 09/17/2022   Other Specified Feeding or Eating Disorder, Emotional Eating Behaviors 02/19/2022   Preventative health care 11/14/2021   Family history of melanoma 11/14/2021   Varicose veins of both lower extremities 11/14/2021   At risk for malnutrition 03/01/2021   At risk for depression 10/19/2020   At risk for impaired metabolic function [id number]   Depression 08/22/2020   Acute reaction to stress- emotional eating 06/22/2020   Other hyperlipidemia 05/25/2020   Prediabetes 05/25/2020   At risk for diabetes mellitus 05/25/2020   Mass of right breast on mammogram 03/17/2018   Cyst of thyroid determined by ultrasound 03/10/2018   Vitamin D deficiency 03/10/2018   Onychomycosis 03/10/2018   Menopausal vasomotor syndrome 03/10/2018   Colon cancer screening 03/10/2018   Mixed hyperlipidemia 02/06/2017   Class 1 obesity with  serious comorbidity and body mass index (BMI) of 34.0 to 34.9 in adult 02/06/2017   Seasonal allergic conjunctivitis 06/28/2015   Ocular rosacea 06/28/2015   Varicose veins of leg with pain 01/14/2012    REFERRING DIAG: left hip pain   THERAPY DIAG:  No diagnosis found.  Rationale for Evaluation and Treatment Rehabilitation  PERTINENT HISTORY: none   PRECAUTIONS: none   SUBJECTIVE:  SUBJECTIVE STATEMENT:  ***   PAIN:  Are you having pain? Yes:  NPRS scale: 0/10 (4-5/10 at worst) Pain location: Left hip  Pain description: "feels like hip is not fitting right" Aggravating factors: when sitting for extended periods and then going to stand, going up stairs, lying on left side Relieving factors: moving/walking, ibuprofen or aleve   OBJECTIVE: (objective measures completed at initial evaluation unless otherwise dated)  PATIENT SURVEYS:  FOTO 69% functional status   COGNITION: Overall cognitive status: Within functional limits for tasks assessed                         SENSATION: WFL   MUSCLE LENGTH: Grossly WFL   POSTURE:             Grossly WFL   PALPATION: Tenderness noted left greater trochanter and glute med region   LOWER EXTREMITY ROM:                       Hip PROM grossly WFL                         Lumbar AROM grossly WFL and non concordant   LOWER EXTREMITY MMT:   MMT Right eval Left eval  Hip flexion 4 4  Hip extension 4 4-  Hip abduction 4 4-  Hip internal rotation 5 4  Hip external rotation 5 5  Knee flexion 5 5  Knee extension 5 5   (Blank rows = not tested)   FUNCTIONAL TESTS:  Squat: weight shift toward right with minimal valgus on left   Step-up: trendelenburg greater on left   GAIT: Assistive device utilized: None Level of assistance: Complete  Independence Comments: Trendelenburg greater on left     TODAY'S TREATMENT:            [facility] Adult PT Treatment:                                                DATE: 11/13/22 Therapeutic Exercise: *** Manual Therapy: *** Neuromuscular re-ed: *** Therapeutic Activity: *** Modalities: *** Self Care: ***   [facility] Adult PT Treatment:                                                DATE: 11/07/2022 Therapeutic Exercise: Side clam with green 2 x 10  Bridge 2 x 10   PATIENT EDUCATION:  Education details: Exam findings, POC, HEP Person educated: Patient Education method: Explanation, Demonstration, Tactile cues, Verbal cues, and Handouts Education comprehension: verbalized understanding, returned demonstration, verbal cues required, tactile cues required, and needs further education   HOME EXERCISE PROGRAM: Access Code: [email or web address]      ASSESSMENT: CLINICAL IMPRESSION: Patient is a 56 y.o. female who was seen today for physical therapy evaluation and treatment for chronic left hip pain. Her pain seems most consistent with glute med tendinopathy and weakness.     OBJECTIVE IMPAIRMENTS: Abnormal gait, decreased activity tolerance, decreased strength, improper body mechanics, and pain.    ACTIVITY LIMITATIONS: sitting, standing, and stairs   PARTICIPATION LIMITATIONS: occupation   PERSONAL FACTORS: Fitness and Time since onset of injury/illness/exacerbation are also affecting  patient's functional outcome.    REHAB POTENTIAL: Good   CLINICAL DECISION MAKING: Stable/uncomplicated   EVALUATION COMPLEXITY: Low     GOALS: Goals reviewed with patient? Yes   SHORT TERM GOALS: Target date: 12/05/2022   Patient will be I with initial HEP in order to progress with therapy. Baseline: HEP provided at eval Goal status: INITIAL   2.  PT will review FOTO with patient by 3rd visit in order to understand expected progress and outcome with therapy. Baseline: FOTO assessed at eval Goal  status: INITIAL   3.  Patient will report </= 2/10 left hip pain when she gets up from a seated position to reduce functional limitations Baseline: 4-5/10 pain Goal status: INITIAL   LONG TERM GOALS: Target date: 01/02/2023   Patient will be I with final HEP to maintain progress from PT. Baseline: HEP provided at eval Goal status: INITIAL   2.  Patient will report >/= 73% status on FOTO to indicate improved functional ability. Baseline: 69% Goal status: INITIAL   3.  Patient will demonstrate left hip strength grossly >/= 4/5 MMT in order to improve ability to go up stairs without pain or limitation Baseline: grossly 4-/5 MMT Goal status: INITIAL   4.  Patient will exhibit improved pelvic control with minimal trendelenburg while performing step-up and with gait to reduce left hip pain Baseline: patient exhibits trendelenburg on left with gait and step-up Goal status: INITIAL     PLAN: PT FREQUENCY: 1x/week   PT DURATION: 8 weeks   PLANNED INTERVENTIONS: Therapeutic exercises, Therapeutic activity, Neuromuscular re-education, Balance training, Gait training, Patient/Family education, Self Care, Joint mobilization, Aquatic Therapy, Dry Needling, Cryotherapy, Moist heat, Ionotophoresis 42m/ml Dexamethasone, Manual therapy, and Re-evaluation   PLAN FOR NEXT SESSION: Review HEP and progress PRN, focus on strengthening for left hip with progression to closed chain exercises, manual/dry needling for left gluteal region, check pelvic alignment/leg length as needed     SGwendolyn Grant PT, DPT, ATC 11/13/22 10:28 AM

## 2022-11-19 ENCOUNTER — Other Ambulatory Visit: Payer: Self-pay | Admitting: Family

## 2022-11-19 DIAGNOSIS — Z1231 Encounter for screening mammogram for malignant neoplasm of breast: Secondary | ICD-10-CM

## 2022-11-19 NOTE — Progress Notes (Signed)
MyChart Video Visit    Virtual Visit via Video Note   This visit type was conducted due to national recommendations for restrictions regarding the COVID-19 Pandemic (e.g. social distancing) in an effort to limit this patient's exposure and mitigate transmission in our community. This patient is at least at moderate risk for complications without adequate follow up. This format is felt to be most appropriate for this patient at this time. Physical exam was limited by quality of the video and audio technology used for the visit. CMA  Patient location: [place] Patient scribe and provider in visit Provider location: Office  I discussed the limitations of evaluation and management by telemedicine and the availability of in person appointments. The patient expressed understanding and agreed to proceed.  Visit Date: 11/20/2022.  Today's healthcare provider: Nance Pear, NP     Subjective:    Patient ID: Alicia Payne, female    DOB: [birth year], 57 y.o.   MRN: [id number]  Chief Complaint  Patient presents with   Watery eye    Complains of eyes being watery and red, she reports this happened once before    HPI Patient is in today for a video visit.   Watery/Red Eyes: She complains of red and watery eyes beginning a couple of weeks ago. She reports that her eyes are crusty in the morning. She denies itching. She does not wear contact lens. She had seen an eye doctor many years ago for very similar symptoms and believes it may be a flare up of ocular rosacea.   Past Medical History:  Diagnosis Date   Arthritis    Cyst of thyroid determined by ultrasound 03/10/2018   Hot flashes    Hyperlipidemia    Joint pain    Knee pain    left knee   Obesity    Phlebitis and thrombophlebitis of superficial vessels of right lower extremity 03/20/2017   Rosacea    Trouble in sleeping    Varicose vein of leg    Vitamin D deficiency     Past Surgical History:  Procedure  Laterality Date   stab phlebectomy Right 08/22/2022   stab phlebectomy > 20 incisions right leg by Gae Gallop MD   TUBAL LIGATION     UTERINE FIBROID SURGERY     VARICOSE VEIN SURGERY      Family History  Problem Relation Age of Onset   Cancer Mother        thyroid   Thyroid cancer Mother 66   Thyroid nodules Sister        thyroidectomy   Melanoma Sister        stage 4   Colon polyps Neg Hx    Colon cancer Neg Hx    Esophageal cancer Neg Hx    Stomach cancer Neg Hx    Rectal cancer Neg Hx     Social History   Socioeconomic History   Marital status: Widowed    Spouse name: Calla Kicks   Number of children: 2   Years of education: Not on file   Highest education level: Not on file  Occupational History   Occupation: Patent examiner:   Tobacco Use   Smoking status: Former    Packs/day: 0.30    Types: Cigarettes    Start date: 1984    Quit date: 1998    Years since quitting: 26.1   Smokeless tobacco: Never  Vaping Use   Vaping Use: Never used  Substance and Sexual Activity   Alcohol use: Yes    Comment: social drinker   Drug use: No   Sexual activity: Not Currently    Partners: Male  Other Topics Concern   Not on file  Social History Narrative   2 daughters- adult (older one in [place] and younger is in [place], [place])   Facilities manager for cardiology   Married (second marriage)   [place] up in [place]- moved to [place] at age age 50    Enjoys reading, movies, house projects/gardening, spending time with family   Dog   Husband has stage 4 bladder cancer diagnosed 2019- has urost   Social Determinants of Radio broadcast assistant Strain: Not on file  Food Insecurity: Not on file  Transportation Needs: Not on file  Physical Activity: Inactive (04/19/2020)   Exercise Vital Sign    Days of Exercise per Week: 0 days    Minutes of Exercise per Session: 0 min  Stress: Not on file  Social Connections: Not on file  Intimate Partner  Violence: Not on file    Outpatient Medications Prior to Visit  Medication Sig Dispense Refill   AMBULATORY NON FORMULARY MEDICATION Knee-high, medium compression, graduated compression stockings. Apply to lower extremities. [email or web address], Zippered Compression Stockings, large circ, long length 1 each 0   Ascorbic Acid (VITAMIN C) 1000 MG tablet Take 1,000 mg by mouth daily.     Bioflavonoid Products (BIOFLEX) TABS Take 1 tablet by mouth daily.     Black Cohosh 160 MG CAPS Take by mouth.     buPROPion (WELLBUTRIN XL) 150 MG 24 hr tablet Take 1 tablet (150 mg total) by mouth every morning. 90 tablet 0   Calcium Citrate-Vitamin D (CALCIUM + D PO) Take 1 tablet by mouth daily.     diclofenac sodium (VOLTAREN) 1 % GEL Apply 4 g topically 4 (four) times daily. To affected joint. 100 g 11   Omega-3 Fatty Acids (FISH OIL PO) Take 1 capsule by mouth daily.     Vitamin D, Ergocalciferol, (DRISDOL) 1.25 MG (50000 UNIT) CAPS capsule Take 1 capsule (50,000 Units total) by mouth every 7 (seven) days. 12 capsule 0   No facility-administered medications prior to visit.    No Known Allergies  Review of Systems  Eyes:  Positive for discharge and redness.       Eyes watery, crusty, and red       Objective:    Physical Exam Constitutional:      General: She is awake. She is not in acute distress. Eyes:     Comments: Limited visualization of sclera noted due to camera resolution  Pulmonary:     Effort: Pulmonary effort is normal.  Neurological:     Mental Status: She is alert and oriented to person, place, and time.     Cranial Nerves: No facial asymmetry.  Psychiatric:        Mood and Affect: Mood normal.        Speech: Speech normal.     LMP 11/20/2014  Wt Readings from Last 3 Encounters:  10/23/22 215 lb (97.5 kg)  09/27/22 217 lb (98.4 kg)  09/17/22 210 lb 12.8 oz (95.6 kg)       Assessment & Plan:   Problem List Items Addressed This Visit       Unprioritized    Conjunctivitis of both eyes - Primary    New. Similar to previous episode of "ocular rosacea" which improved with antibiotic eye drops.  Meds ordered this encounter  Medications   ciprofloxacin (CILOXAN) 0.3 % ophthalmic ointment    Sig: Place into both eyes 3 (three) times daily for 5 days.    Dispense:  3.5 g    Refill:  0    Order Specific Question:   Supervising Provider    Answer:   Penni Homans A [[id number]]    I discussed the assessment and treatment plan with the patient. The patient was provided an opportunity to ask questions and all were answered. The patient agreed with the plan and demonstrated an understanding of the instructions.   The patient was advised to call back or seek an in-person evaluation if the symptoms worsen or if the condition fails to improve as anticipated.   I,Rachel Rivera,acting as a Education administrator for Nance Pear, NP.,have documented all relevant documentation on the behalf of Nance Pear, NP,as directed by  Nance Pear, NP while in the presence of Nance Pear, NP.  Nance Pear, NP [facility] (phone) [phone] (fax)  [place]

## 2022-11-20 ENCOUNTER — Other Ambulatory Visit: Payer: Self-pay

## 2022-11-20 ENCOUNTER — Encounter: Payer: Self-pay | Admitting: Physical Therapy

## 2022-11-20 ENCOUNTER — Ambulatory Visit: Payer: 59 | Admitting: Physical Therapy

## 2022-11-20 ENCOUNTER — Other Ambulatory Visit (HOSPITAL_COMMUNITY): Payer: Self-pay

## 2022-11-20 ENCOUNTER — Telehealth (INDEPENDENT_AMBULATORY_CARE_PROVIDER_SITE_OTHER): Payer: 59 | Admitting: Family

## 2022-11-20 DIAGNOSIS — M25552 Pain in left hip: Secondary | ICD-10-CM

## 2022-11-20 DIAGNOSIS — H109 Unspecified conjunctivitis: Secondary | ICD-10-CM | POA: Diagnosis not present

## 2022-11-20 DIAGNOSIS — M6281 Muscle weakness (generalized): Secondary | ICD-10-CM | POA: Diagnosis not present

## 2022-11-20 MED ORDER — CIPROFLOXACIN HCL 0.3 % OP OINT
TOPICAL_OINTMENT | Freq: Three times a day (TID) | OPHTHALMIC | 0 refills | Status: AC
  Filled 2022-11-20: qty 3.5, 5d supply, fill #0

## 2022-11-20 NOTE — Therapy (Signed)
OUTPATIENT PHYSICAL THERAPY TREATMENT NOTE   Patient Name: Alicia Payne MRN: [id number] DOB:05-07-1966, 57 y.o., female Today's Date: 11/20/2022  PCP: Debbrah Alar, NP REFERRING PROVIDER: Thurman Coyer, DO   END OF SESSION:   PT End of Session - 11/20/22 1620     Visit Number 2    Number of Visits 9    Date for PT Re-Evaluation 01/02/23    Authorization Type [facility]    PT Start Time 1615    PT Stop Time [id number]    PT Time Calculation (min) 40 min    Activity Tolerance Patient tolerated treatment well    Behavior During Therapy WFL for tasks assessed/performed             Past Medical History:  Diagnosis Date   Arthritis    Cyst of thyroid determined by ultrasound 03/10/2018   Hot flashes    Hyperlipidemia    Joint pain    Knee pain    left knee   Obesity    Phlebitis and thrombophlebitis of superficial vessels of right lower extremity 03/20/2017   Rosacea    Trouble in sleeping    Varicose vein of leg    Vitamin D deficiency    Past Surgical History:  Procedure Laterality Date   stab phlebectomy Right 08/22/2022   stab phlebectomy > 20 incisions right leg by Gae Gallop MD   [place]     Patient Active Problem List   Diagnosis Date Noted   Conjunctivitis of both eyes 11/20/2022   Left hip pain 10/23/2022   Class 2 severe obesity with serious comorbidity and body mass index (BMI) of 38.0 to 38.9 in adult ([place]) 09/17/2022   Leg cramping 09/17/2022   Other Specified Feeding or Eating Disorder, Emotional Eating Behaviors 02/19/2022   Preventative health care 11/14/2021   Family history of melanoma 11/14/2021   Varicose veins of both lower extremities 11/14/2021   At risk for malnutrition 03/01/2021   At risk for depression 10/19/2020   At risk for impaired metabolic function [id number]   Depression 08/22/2020   Acute reaction to stress- emotional eating 06/22/2020   Other  hyperlipidemia 05/25/2020   Prediabetes 05/25/2020   At risk for diabetes mellitus 05/25/2020   Mass of right breast on mammogram 03/17/2018   Cyst of thyroid determined by ultrasound 03/10/2018   Vitamin D deficiency 03/10/2018   Onychomycosis 03/10/2018   Menopausal vasomotor syndrome 03/10/2018   Colon cancer screening 03/10/2018   Mixed hyperlipidemia 02/06/2017   Class 1 obesity with serious comorbidity and body mass index (BMI) of 34.0 to 34.9 in adult 02/06/2017   Seasonal allergic conjunctivitis 06/28/2015   Ocular rosacea 06/28/2015   Varicose veins of leg with pain 01/14/2012    REFERRING DIAG: Left hip pain   THERAPY DIAG:  Pain in left hip  Muscle weakness (generalized)  Rationale for Evaluation and Treatment Rehabilitation  PERTINENT HISTORY: None   PRECAUTIONS: None    SUBJECTIVE:           SUBJECTIVE STATEMENT:  Patient reports she is feeling pretty good today.  PAIN:  Are you having pain? Yes:  NPRS scale: 0/10 (4-5/10 at worst) Pain location: Left hip  Pain description: "feels like hip is not fitting right" Aggravating factors: when sitting for extended periods and then going to stand, going up stairs, lying on left side Relieving factors: moving/walking, ibuprofen or aleve  OBJECTIVE: (objective measures completed at initial evaluation unless otherwise dated) PATIENT SURVEYS:  FOTO 69% functional status   PALPATION: Tenderness noted left greater trochanter and glute med region   LOWER EXTREMITY ROM:                       Hip PROM grossly WFL                         Lumbar AROM grossly WFL and non concordant   LOWER EXTREMITY MMT:   MMT Right eval Left eval Left 11/20/2022  Hip flexion 4 4   Hip extension 4 4-   Hip abduction 4 4- 4-  Hip internal rotation 5 4   Hip external rotation 5 5   Knee flexion 5 5   Knee extension 5 5    (Blank rows = not tested)   FUNCTIONAL TESTS:  Squat: weight shift toward right with minimal valgus on  left   Step-up: trendelenburg greater on left   GAIT: Assistive device utilized: None Level of assistance: Complete Independence Comments: Trendelenburg greater on left     TODAY'S TREATMENT:            [facility] Adult PT Treatment:                                                DATE: 11/20/2022 Therapeutic Exercise: NuStep L7 x 5 min with LE only while taking subjective Bridge x 10 Marching bridge 2 x 6 Side clam with blue 2 x 10  SLR 2 x 10 each with abdominal engagement Side plank on knees 2 x 3 x 15 sec on left 90-90 alternating leg lift abdominal set x 10 Sit to stand from elevated table with blue at knees 2 x 10 Hip hike on 2" box 2 x 10 on left   [facility] Adult PT Treatment:                                                DATE: 11/07/2022 Therapeutic Exercise: Side clam with green 2 x 10  Bridge 2 x 10   PATIENT EDUCATION:  Education details: HEP update Person educated: Patient Education method: Consulting civil engineer, Demonstration, Tactile cues, Verbal cues, and Handouts Education comprehension: verbalized understanding, returned demonstration, verbal cues required, tactile cues required, and needs further education   HOME EXERCISE PROGRAM: Access Code: [email or web address]      ASSESSMENT: CLINICAL IMPRESSION: Patient tolerated therapy well with no adverse effects. Therapy focused on progressing hip strength with good tolerance. She did not report any increased pain with therapy but did report muscular fatigue and greater difficulty with exercises on left. Updated HEP to progress hip strengthening. Patient would benefit from continued skilled PT to progress strength and control in order to reduce pain and maximize functional ability.     OBJECTIVE IMPAIRMENTS: Abnormal gait, decreased activity tolerance, decreased strength, improper body mechanics, and pain.    ACTIVITY LIMITATIONS: sitting, standing, and stairs   PARTICIPATION LIMITATIONS: occupation   PERSONAL FACTORS: Fitness and Time  since onset of injury/illness/exacerbation are also affecting patient's functional outcome.      GOALS: Goals reviewed with patient? Yes   SHORT TERM GOALS:  Target date: 12/05/2022   Patient will be I with initial HEP in order to progress with therapy. Baseline: HEP provided at eval Goal status: INITIAL   2.  PT will review FOTO with patient by 3rd visit in order to understand expected progress and outcome with therapy. Baseline: FOTO assessed at eval Goal status: INITIAL   3.  Patient will report </= 2/10 left hip pain when she gets up from a seated position to reduce functional limitations Baseline: 4-5/10 pain Goal status: INITIAL   LONG TERM GOALS: Target date: 01/02/2023   Patient will be I with final HEP to maintain progress from PT. Baseline: HEP provided at eval Goal status: INITIAL   2.  Patient will report >/= 73% status on FOTO to indicate improved functional ability. Baseline: 69% Goal status: INITIAL   3.  Patient will demonstrate left hip strength grossly >/= 4/5 MMT in order to improve ability to go up stairs without pain or limitation Baseline: grossly 4-/5 MMT Goal status: INITIAL   4.  Patient will exhibit improved pelvic control with minimal trendelenburg while performing step-up and with gait to reduce left hip pain Baseline: patient exhibits trendelenburg on left with gait and step-up Goal status: INITIAL     PLAN: PT FREQUENCY: 1x/week   PT DURATION: 8 weeks   PLANNED INTERVENTIONS: Therapeutic exercises, Therapeutic activity, Neuromuscular re-education, Balance training, Gait training, Patient/Family education, Self Care, Joint mobilization, Aquatic Therapy, Dry Needling, Cryotherapy, Moist heat, Ionotophoresis 15m/ml Dexamethasone, Manual therapy, and Re-evaluation   PLAN FOR NEXT SESSION: Review HEP and progress PRN, focus on strengthening for left hip with progression to closed chain exercises, manual/dry needling for left gluteal region, check  pelvic alignment/leg length as needed   CHilda Blades PT, DPT, LAT, ATC 11/20/22  5:03 PM Phone: [phone]: [phone]

## 2022-11-20 NOTE — Patient Instructions (Signed)
Access Code: [email or web address] URL: [email or web address] Date: 11/20/2022 Prepared by: Hilda Blades  Exercises - Clam with Resistance  - 1 x daily - 3-4 x weekly - 3 sets - 10 reps - Marching Bridge  - 1 x daily - 3-4 x weekly - 3 sets - 10 reps

## 2022-11-20 NOTE — Assessment & Plan Note (Signed)
New. Similar to previous episode of "ocular rosacea" which improved with antibiotic eye drops.

## 2022-11-21 ENCOUNTER — Other Ambulatory Visit (HOSPITAL_COMMUNITY): Payer: Self-pay

## 2022-11-27 ENCOUNTER — Ambulatory Visit
Admission: RE | Admit: 2022-11-27 | Discharge: 2022-11-27 | Disposition: A | Discharge status: Home / Self Care | Payer: 59 | Source: Ambulatory Visit | Attending: Family | Admitting: Family

## 2022-11-27 DIAGNOSIS — Z1231 Encounter for screening mammogram for malignant neoplasm of breast: Secondary | ICD-10-CM

## 2022-11-29 ENCOUNTER — Encounter: Payer: Self-pay | Admitting: Family

## 2022-12-03 ENCOUNTER — Other Ambulatory Visit: Payer: Self-pay

## 2022-12-03 ENCOUNTER — Ambulatory Visit: Payer: 59 | Attending: Sports Medicine | Admitting: Physical Therapy

## 2022-12-03 ENCOUNTER — Encounter: Payer: Self-pay | Admitting: Physical Therapy

## 2022-12-03 DIAGNOSIS — M25552 Pain in left hip: Secondary | ICD-10-CM | POA: Diagnosis not present

## 2022-12-03 DIAGNOSIS — M6281 Muscle weakness (generalized): Secondary | ICD-10-CM | POA: Diagnosis not present

## 2022-12-03 NOTE — Therapy (Addendum)
OUTPATIENT PHYSICAL THERAPY TREATMENT NOTE  DISCHARGE   Patient Name: Alicia Payne MRN: [id number] DOB:05-31-1966, 57 y.o., female Today's Date: 12/03/2022  PCP: Sandford Craze, NP REFERRING PROVIDER: Ralene Cork, DO   END OF SESSION:   PT End of Session - 12/03/22 1710     Visit Number 3    Number of Visits 9    Date for PT Re-Evaluation 01/02/23    Authorization Type [facility]OC[facility]    PT Start Time 1618    PT Stop Time 1700    PT Time Calculation (min) 42 min    Activity Tolerance Patient tolerated treatment well    Behavior During Therapy WFL for tasks assessed/performed              Past Medical History:  Diagnosis Date   Arthritis    Cyst of thyroid determined by ultrasound 03/10/2018   Hot flashes    Hyperlipidemia    Joint pain    Knee pain    left knee   Obesity    Phlebitis and thrombophlebitis of superficial vessels of right lower extremity 03/20/2017   Rosacea    Trouble in sleeping    Varicose vein of leg    Vitamin D deficiency    Past Surgical History:  Procedure Laterality Date   stab phlebectomy Right 08/22/2022   stab phlebectomy > 20 incisions right leg by Cari Caraway MD   TUBAL LIGATION     UTERINE FIBROID SURGERY     VARICOSE VEIN SURGERY     Patient Active Problem List   Diagnosis Date Noted   Conjunctivitis of both eyes 11/20/2022   Left hip pain 10/23/2022   Class 2 severe obesity with serious comorbidity and body mass index (BMI) of 38.0 to 38.9 in adult (HCC) 09/17/2022   Leg cramping 09/17/2022   Other Specified Feeding or Eating Disorder, Emotional Eating Behaviors 02/19/2022   Preventative health care 11/14/2021   Family history of melanoma 11/14/2021   Varicose veins of both lower extremities 11/14/2021   At risk for malnutrition 03/01/2021   At risk for depression 10/19/2020   At risk for impaired metabolic function 08/22/2020   Depression 08/22/2020   Acute reaction to stress- emotional eating  06/22/2020   Other hyperlipidemia 05/25/2020   Prediabetes 05/25/2020   At risk for diabetes mellitus 05/25/2020   Mass of right breast on mammogram 03/17/2018   Cyst of thyroid determined by ultrasound 03/10/2018   Vitamin D deficiency 03/10/2018   Onychomycosis 03/10/2018   Menopausal vasomotor syndrome 03/10/2018   Colon cancer screening 03/10/2018   Mixed hyperlipidemia 02/06/2017   Class 1 obesity with serious comorbidity and body mass index (BMI) of 34.0 to 34.9 in adult 02/06/2017   Seasonal allergic conjunctivitis 06/28/2015   Ocular rosacea 06/28/2015   Varicose veins of leg with pain 01/14/2012    REFERRING DIAG: Left hip pain   THERAPY DIAG:  Pain in left hip  Muscle weakness (generalized)  Rationale for Evaluation and Treatment Rehabilitation  PERTINENT HISTORY: None   PRECAUTIONS: None    SUBJECTIVE:           SUBJECTIVE STATEMENT:  Patient reports today she is feeling good. She did drive for about 1 and a half hours it gets stiff and uncomfortable when she gets up. Sometime she gets sore on the outside of the hip without her lying on it, but it isn't there all the time.  PAIN:  Are you having pain? Yes:  NPRS scale: 0/10 (4-5/10  at worst) Pain location: Left hip  Pain description: "feels like hip is not fitting right" Aggravating factors: when sitting for extended periods and then going to stand, going up stairs, lying on left side Relieving factors: moving/walking, ibuprofen or aleve   OBJECTIVE: (objective measures completed at initial evaluation unless otherwise dated) PATIENT SURVEYS:  FOTO 69% functional status   PALPATION: Tenderness noted left greater trochanter and glute med region   LOWER EXTREMITY ROM:                       Hip PROM grossly WFL                         Lumbar AROM grossly WFL and non concordant   LOWER EXTREMITY MMT:   MMT Right eval Left eval Left 11/20/2022  Hip flexion 4 4   Hip extension 4 4-   Hip abduction 4  4- 4-  Hip internal rotation 5 4   Hip external rotation 5 5   Knee flexion 5 5   Knee extension 5 5    (Blank rows = not tested)   FUNCTIONAL TESTS:  Squat: weight shift toward right with minimal valgus on left   Step-up: trendelenburg greater on left  12/03/2022: trendelenburg on left, requires UE support   GAIT: Assistive device utilized: None Level of assistance: Complete Independence Comments: Trendelenburg greater on left     TODAY'S TREATMENT:            [facility] Adult PT Treatment:                                                DATE: 12/03/2022 Therapeutic Exercise: NuStep L7 x 5 min with LE only while taking subjective Bridge with clamshell using blue 2 x 3 x 5 90-90 abdominal set 2 x 5 x 5 seconds Sidelying hip abduction 2 x 10 each Side plank on knees with clamshell 2 x 3 x 5 Side and forward step-up on 4" box x 10 each - requires UE support on left   [facility] Adult PT Treatment:                                                DATE: 11/20/2022 Therapeutic Exercise: NuStep L7 x 5 min with LE only while taking subjective Bridge x 10 Marching bridge 2 x 6 Side clam with blue 2 x 10  SLR 2 x 10 each with abdominal engagement Side plank on knees 2 x 3 x 15 sec on left 90-90 alternating leg lift abdominal set x 10 Sit to stand from elevated table with blue at knees 2 x 10 Hip hike on 2" box 2 x 10 on left  [facility] Adult PT Treatment:                                                DATE: 11/07/2022 Therapeutic Exercise: Side clam with green 2 x 10  Bridge 2 x 10   PATIENT EDUCATION:  Education details: HEP Person educated: Patient Education method: Explanation,  Demonstration, Tactile cues, Verbal cues Education comprehension: verbalized understanding, returned demonstration, verbal cues required, tactile cues required, and needs further education   HOME EXERCISE PROGRAM: Access Code: [email or web address]      ASSESSMENT: CLINICAL IMPRESSION: Patient tolerated therapy well with no  adverse effects. Therapy continues to focus on progressing her hip strength with good tolerance. She does report greater difficulty and soreness of the left hip with exercises, and requires UE assist with step-up on the left due to hip weakness and poor control. No changes to HEP this visit. Her symptoms continue to seem to be glute med related. Patient would benefit from continued skilled PT to progress strength and control in order to reduce pain and maximize functional ability.     OBJECTIVE IMPAIRMENTS: Abnormal gait, decreased activity tolerance, decreased strength, improper body mechanics, and pain.    ACTIVITY LIMITATIONS: sitting, standing, and stairs   PARTICIPATION LIMITATIONS: occupation   PERSONAL FACTORS: Fitness and Time since onset of injury/illness/exacerbation are also affecting patient's functional outcome.      GOALS: Goals reviewed with patient? Yes   SHORT TERM GOALS: Target date: 12/05/2022   Patient will be I with initial HEP in order to progress with therapy. Baseline: HEP provided at eval 12/03/2022: independent with initial HEP Goal status: MET   2.  PT will review FOTO with patient by 3rd visit in order to understand expected progress and outcome with therapy. Baseline: FOTO assessed at eval 12/03/2022: reviewed Goal status: MET   3.  Patient will report </= 2/10 left hip pain when she gets up from a seated position to reduce functional limitations Baseline: 4-5/10 pain 12/03/2022: continues to report stiffness and pain Goal status: ONGOING   LONG TERM GOALS: Target date: 01/02/2023   Patient will be I with final HEP to maintain progress from PT. Baseline: HEP provided at eval Goal status: INITIAL   2.  Patient will report >/= 73% status on FOTO to indicate improved functional ability. Baseline: 69% Goal status: INITIAL   3.  Patient will demonstrate left hip strength grossly >/= 4/5 MMT in order to improve ability to go up stairs without pain or  limitation Baseline: grossly 4-/5 MMT Goal status: INITIAL   4.  Patient will exhibit improved pelvic control with minimal trendelenburg while performing step-up and with gait to reduce left hip pain Baseline: patient exhibits trendelenburg on left with gait and step-up Goal status: INITIAL     PLAN: PT FREQUENCY: 1x/week   PT DURATION: 8 weeks   PLANNED INTERVENTIONS: Therapeutic exercises, Therapeutic activity, Neuromuscular re-education, Balance training, Gait training, Patient/Family education, Self Care, Joint mobilization, Aquatic Therapy, Dry Needling, Cryotherapy, Moist heat, Ionotophoresis 4mg /ml Dexamethasone, Manual therapy, and Re-evaluation   PLAN FOR NEXT SESSION: Review HEP and progress PRN, focus on strengthening for left hip with progression to closed chain exercises, manual/dry needling for left gluteal region, check pelvic alignment/leg length as needed   Rosana Hoes, PT, DPT, LAT, ATC 12/03/22  5:10 PM Phone: [phone] Fax: [phone]     PHYSICAL THERAPY DISCHARGE SUMMARY  Visits from Start of Care: 3  Current functional level related to goals / functional outcomes: See above   Remaining deficits: See above   Education / Equipment: HEP   Patient agrees to discharge. Patient goals were not met. Patient is being discharged due to not returning since the last visit.  Rosana Hoes, PT, DPT, LAT, ATC 01/25/23  8:21 AM Phone: [phone] Fax: [phone]

## 2022-12-12 ENCOUNTER — Ambulatory Visit: Payer: 59 | Admitting: Physical Therapy

## 2022-12-17 ENCOUNTER — Ambulatory Visit (INDEPENDENT_AMBULATORY_CARE_PROVIDER_SITE_OTHER): Payer: 59 | Admitting: Family Medicine

## 2023-02-04 ENCOUNTER — Ambulatory Visit (INDEPENDENT_AMBULATORY_CARE_PROVIDER_SITE_OTHER): Payer: 59 | Admitting: Family Medicine

## 2023-02-26 ENCOUNTER — Ambulatory Visit (INDEPENDENT_AMBULATORY_CARE_PROVIDER_SITE_OTHER): Payer: 59 | Admitting: Family Medicine

## 2023-04-16 ENCOUNTER — Other Ambulatory Visit: Payer: Self-pay | Admitting: Oncology

## 2023-04-16 DIAGNOSIS — Z006 Encounter for examination for normal comparison and control in clinical research program: Secondary | ICD-10-CM

## 2023-04-17 ENCOUNTER — Encounter: Payer: 59 | Admitting: Family

## 2023-04-23 ENCOUNTER — Ambulatory Visit (INDEPENDENT_AMBULATORY_CARE_PROVIDER_SITE_OTHER): Payer: 59 | Admitting: Family

## 2023-04-23 ENCOUNTER — Encounter: Payer: Self-pay | Admitting: Family

## 2023-04-23 ENCOUNTER — Other Ambulatory Visit (HOSPITAL_COMMUNITY): Payer: Self-pay

## 2023-04-23 ENCOUNTER — Other Ambulatory Visit (HOSPITAL_COMMUNITY): Admission: RE | Admit: 2023-04-23 | Payer: 59 | Source: Ambulatory Visit

## 2023-04-23 VITALS — BP 118/72 | HR 67 | Temp 98.8°F | Resp 16 | Ht 67.5 in | Wt 222.0 lb

## 2023-04-23 DIAGNOSIS — Z01419 Encounter for gynecological examination (general) (routine) without abnormal findings: Secondary | ICD-10-CM

## 2023-04-23 DIAGNOSIS — B351 Tinea unguium: Secondary | ICD-10-CM

## 2023-04-23 DIAGNOSIS — E7849 Other hyperlipidemia: Secondary | ICD-10-CM | POA: Diagnosis not present

## 2023-04-23 DIAGNOSIS — R739 Hyperglycemia, unspecified: Secondary | ICD-10-CM | POA: Diagnosis not present

## 2023-04-23 DIAGNOSIS — Z808 Family history of malignant neoplasm of other organs or systems: Secondary | ICD-10-CM

## 2023-04-23 DIAGNOSIS — Z0000 Encounter for general adult medical examination without abnormal findings: Secondary | ICD-10-CM | POA: Diagnosis not present

## 2023-04-23 DIAGNOSIS — E559 Vitamin D deficiency, unspecified: Secondary | ICD-10-CM

## 2023-04-23 LAB — COMPREHENSIVE METABOLIC PANEL
ALT: 17 U/L (ref 0–35)
AST: 13 U/L (ref 0–37)
Albumin: 4.5 g/dL (ref 3.5–5.2)
Alkaline Phosphatase: 58 U/L (ref 39–117)
BUN: 18 mg/dL (ref 6–23)
CO2: 26 mEq/L (ref 19–32)
Calcium: 9.6 mg/dL (ref 8.4–10.5)
Chloride: 104 mEq/L (ref 96–112)
Creatinine, Ser: 0.75 mg/dL (ref 0.40–1.20)
GFR: 88.14 mL/min (ref >60.00)
Glucose, Bld: 100 mg/dL — ABNORMAL HIGH (ref 70–99)
Potassium: 4.3 mEq/L (ref 3.5–5.1)
Sodium: 139 mEq/L (ref 135–145)
Total Bilirubin: 0.9 mg/dL (ref 0.2–1.2)
Total Protein: 6.8 g/dL (ref 6.0–8.3)

## 2023-04-23 LAB — LIPID PANEL
Cholesterol: 282 mg/dL — ABNORMAL HIGH (ref 0–200)
HDL: 55.2 mg/dL (ref >39.00)
LDL Cholesterol: 195 mg/dL — ABNORMAL HIGH (ref 0–99)
NonHDL: 226.82
Total CHOL/HDL Ratio: 5
Triglycerides: 158 mg/dL — ABNORMAL HIGH (ref 0.0–149.0)
VLDL: 31.6 mg/dL (ref 0.0–40.0)

## 2023-04-23 LAB — HEMOGLOBIN A1C: Hgb A1c MFr Bld: 5.8 % (ref 4.6–6.5)

## 2023-04-23 LAB — VITAMIN D 25 HYDROXY (VIT D DEFICIENCY, FRACTURES): VITD: 44.83 ng/mL (ref 30.00–100.00)

## 2023-04-23 MED ORDER — TERBINAFINE HCL 250 MG PO TABS
250.0000 mg | ORAL_TABLET | Freq: Every day | ORAL | 0 refills | Status: DC
  Filled 2023-04-23: qty 30, 30d supply, fill #0

## 2023-04-23 NOTE — Assessment & Plan Note (Signed)
  General Health Maintenance: -Flu shot and COVID booster due in the fall. -Colonoscopy due on May 25. -Pap smear due in September, but will be performed today. -Mammogram done on February 24. -Consider genetic testing due to family history of cancer-work is offering through a study -Check cholesterol and A1C today.

## 2023-04-23 NOTE — Assessment & Plan Note (Signed)
New.  Rx with lamisil x 12 weeks, repeat LFT in 1 month.

## 2023-04-23 NOTE — Patient Instructions (Signed)
VISIT SUMMARY:  During your recent visit, we discussed your concerns about weight gain, a persistent toenail fungus, and your interest in genetic testing due to your family's medical history. We also reviewed your current medications and your general health maintenance.  YOUR PLAN:  -WEIGHT MANAGEMENT: You've gained some weight recently, likely due to stress and not consistently following your diet plan. It's important to get back on track with your diet.  -TOENAIL FUNGUS: Your toenail fungus hasn't improved with over-the-counter treatments. We'll start a prescription medication called Lamisil to help clear it up.  -VITAMIN D DEFICIENCY: You're currently taking prescription Vitamin D supplements. We'll check your Vitamin D levels and may reduce your dose if they're within the normal range.  -DEPRESSION: You've been taking Wellbutrin for depression, but you're not currently taking it regularly and your mood has been stable. We'll discontinue this medication.  -GENERAL HEALTH MAINTENANCE: We discussed your upcoming health maintenance tasks, including your flu shot and COVID booster, colonoscopy, Pap smear, and mammogram. We'll also continue your B12 injections and check your cholesterol and A1C levels.  INSTRUCTIONS:  Please start taking Lamisil for your toenail fungus and we'll check your liver function in 4 weeks. We'll also check your Vitamin D levels today and may adjust your dose based on the results. Remember to discontinue your Wellbutrin. Your flu shot and COVID booster are due in the fall, your colonoscopy is due on May 25, and your Pap smear will be performed today. Your mammogram was done on February 24.  We will check your cholesterol and A1C levels today. We also discussed the possibility of genetic testing due to your family history of cancer.

## 2023-04-23 NOTE — Progress Notes (Signed)
Subjective:     Patient ID: Alicia Payne, female    DOB: [birth year], 57 y.o.   MRN: [id number]  Chief Complaint  Patient presents with   Annual Exam    HPI  Discussed the use of AI scribe software for clinical note transcription with the patient, who gave verbal consent to proceed.  History of Present Illness         Patient presents today for complete physical.  Immunizations: flu and covid booster this fall Diet: needs improvement Wt Readings from Last 3 Encounters:  04/23/23 222 lb (100.7 kg)  10/23/22 215 lb (97.5 kg)  09/27/22 217 lb (98.4 kg)  Exercise: some walking the dog Colonoscopy: 5/22 Pap Smear: due Mammogram: 2/24 Vision/Dental: up to date      Health Maintenance Due  Topic Date Due   HIV Screening  Never done   Hepatitis C Screening  Never done   COVID-19 Vaccine (3 - 2023-24 season) 06/01/2022    Past Medical History:  Diagnosis Date   Arthritis    Cyst of thyroid determined by ultrasound 03/10/2018   Hot flashes    Hyperlipidemia    Joint pain    Knee pain    left knee   Obesity    Phlebitis and thrombophlebitis of superficial vessels of right lower extremity 03/20/2017   Rosacea    Trouble in sleeping    Varicose vein of leg    Vitamin D deficiency     Past Surgical History:  Procedure Laterality Date   stab phlebectomy Right 08/22/2022   stab phlebectomy > 20 incisions right leg by Cari Caraway MD   TUBAL LIGATION     UTERINE FIBROID SURGERY     VARICOSE VEIN SURGERY      Family History  Problem Relation Age of Onset   Cancer Mother        thyroid   Thyroid cancer Mother 98   Thyroid nodules Sister        thyroidectomy   Melanoma Sister        stage 4   Colon polyps Neg Hx    Colon cancer Neg Hx    Esophageal cancer Neg Hx    Stomach cancer Neg Hx    Rectal cancer Neg Hx     Social History   Socioeconomic History   Marital status: Widowed    Spouse name: Duaine Dredge   Number of children: 2   Years  of education: Not on file   Highest education level: Not on file  Occupational History   Occupation: Sports coach: [employer]  Tobacco Use   Smoking status: Former    Current packs/day: 0.00    Average packs/day: 0.3 packs/day for 14.0 years (4.2 ttl pk-yrs)    Types: Cigarettes    Start date: 43    Quit date: 1998    Years since quitting: 26.5   Smokeless tobacco: Never  Vaping Use   Vaping status: Never Used  Substance and Sexual Activity   Alcohol use: Yes    Comment: social drinker   Drug use: No   Sexual activity: Not Currently    Partners: Male  Other Topics Concern   Not on file  Social History Narrative   2 daughters- adult (older one in [place] and younger is in [place], [place])   Development worker, 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   Married (second marriage)   Grew up in [place]- moved to [place] at age age 69  Enjoys reading, movies, house projects/gardening, spending time with family   Dog   Husband has stage 4 bladder cancer diagnosed 2019- has urost   Social Determinants of Corporate investment banker Strain: Not on file  Food Insecurity: Not on file  Transportation Needs: Not on file  Physical Activity: Inactive (04/19/2020)   Exercise Vital Sign    Days of Exercise per Week: 0 days    Minutes of Exercise per Session: 0 min  Stress: Not on file  Social Connections: Unknown (02/10/2022)   Received from [facility]   Social Network    Social Network: Not on file  Intimate Partner Violence: Unknown (01/02/2022)   Received from [facility], [facility]   HITS    Physically Hurt: Not on file    Insult or Talk Down To: Not on file    Threaten Physical Harm: Not on file    Scream or Curse: Not on file    Outpatient Medications Prior to Visit  Medication Sig Dispense Refill   AMBULATORY NON FORMULARY MEDICATION Knee-high, medium compression, graduated compression stockings. Apply to lower extremities. [email or web address],  Zippered Compression Stockings, large circ, long length 1 each 0   Ascorbic Acid (VITAMIN C) 1000 MG tablet Take 1,000 mg by mouth daily.     Bioflavonoid Products (BIOFLEX) TABS Take 1 tablet by mouth daily.     Black Cohosh 160 MG CAPS Take by mouth.     Calcium Citrate-Vitamin D (CALCIUM + D PO) Take 1 tablet by mouth daily.     diclofenac sodium (VOLTAREN) 1 % GEL Apply 4 g topically 4 (four) times daily. To affected joint. 100 g 11   Omega-3 Fatty Acids (FISH OIL PO) Take 1 capsule by mouth daily.     Vitamin D, Ergocalciferol, (DRISDOL) 1.25 MG (50000 UNIT) CAPS capsule Take 1 capsule (50,000 Units total) by mouth every 7 (seven) days. 12 capsule 0   buPROPion (WELLBUTRIN XL) 150 MG 24 hr tablet Take 1 tablet (150 mg total) by mouth every morning. 90 tablet 0   No facility-administered medications prior to visit.    No Known Allergies  Review of Systems  Constitutional:  Negative for weight loss.  HENT:  Negative for congestion and hearing loss.   Eyes:  Negative for blurred vision.  Respiratory:  Negative for cough.   Cardiovascular:  Negative for leg swelling.  Gastrointestinal:  Negative for constipation and diarrhea.  Genitourinary:  Negative for dysuria and frequency.  Musculoskeletal:  Negative for joint pain and myalgias.  Skin:  Negative for rash.  Neurological:  Negative for headaches.  Psychiatric/Behavioral:         Denies significant anxiety/depression       Objective:    Physical Exam   BP 118/72 (BP Location: Right Arm, Patient Position: Sitting, Cuff Size: Large)   Pulse 67   Temp 98.8 F (37.1 C) (Oral)   Resp 16   Ht 5' 7.5" (1.715 m)   Wt 222 lb (100.7 kg)   LMP 11/20/2014   SpO2 98%   BMI 34.26 kg/m  Wt Readings from Last 3 Encounters:  04/23/23 222 lb (100.7 kg)  10/23/22 215 lb (97.5 kg)  09/27/22 217 lb (98.4 kg)  Physical Exam  Constitutional: She is oriented to person, place, and time. She appears well-developed and well-nourished. No  distress.  HENT:  Head: Normocephalic and atraumatic.  Right Ear: Tympanic membrane and ear canal normal.  Left Ear: Tympanic membrane and ear canal normal.  Mouth/Throat: Oropharynx is clear and moist.  Eyes: Pupils are equal, round, and reactive to light. No scleral icterus.  Neck: Normal range of motion. No thyromegaly present.  Cardiovascular: Normal rate and regular rhythm.   No murmur heard. Pulmonary/Chest: Effort normal and breath sounds normal. No respiratory distress. He has no wheezes. She has no rales. She exhibits no tenderness.  Abdominal: Soft. Bowel sounds are normal. She exhibits no distension and no mass. There is no tenderness. There is no rebound and no guarding.  Musculoskeletal: She exhibits no edema.  Lymphadenopathy:    She has no cervical adenopathy.  Neurological: She is alert and oriented to person, place, and time. She has normal patellar reflexes. She exhibits normal muscle tone. Coordination normal.  Skin: Skin is warm and dry.  Psychiatric: She has a normal mood and affect. Her behavior is normal. Judgment and thought content normal.  Breasts: Examined lying Right: Without masses, retractions, discharge or axillary adenopathy.  Left: Without masses, retractions, discharge or axillary adenopathy.  Inguinal/mons: Normal without inguinal adenopathy  External genitalia: Normal  BUS/Urethra/Skene's glands: Normal  Bladder: Normal  Vagina: atrophic Cervix: Normal  Uterus: normal in size, shape and contour. Midline and mobile  Adnexa/parametria:  Rt: Without masses or tenderness.  Lt: Without masses or tenderness.  Anus and perineum: Normal            Assessment & Plan:        Assessment & Plan:   Problem List Items Addressed This Visit       Unprioritized   Vitamin D deficiency   Relevant Orders   VITAMIN D 25 Hydroxy (Vit-D Deficiency, Fractures)   Preventative health care - Primary     General Health Maintenance: -Flu shot and COVID  booster due in the fall. -Colonoscopy due on May 25. -Pap smear due in September, but will be performed today. -Mammogram done on February 24. -Consider genetic testing due to family history of cancer-work is offering through a study -Check cholesterol and A1C today.      Other hyperlipidemia   Relevant Orders   Lipid panel   Onychomycosis    New.  Rx with lamisil x 12 weeks, repeat LFT in 1 month.      Relevant Medications   terbinafine (LAMISIL) 250 MG tablet   Other Relevant Orders   Hepatic function panel   Family history of melanoma   Relevant Orders   Ambulatory referral to Dermatology   Other Visit Diagnoses     Hyperglycemia       Relevant Orders   HgB A1c   Comp Met (CMET)   Encounter for routine gynecological examination with Papanicolaou smear of cervix       Relevant Orders   Cytology - PAP( [place])       I have discontinued Seneca H. Bernabei's buPROPion. I am also having her start on terbinafine. Additionally, I am having her maintain her Omega-3 Fatty Acids (FISH OIL PO), Calcium Citrate-Vitamin D (CALCIUM + D PO), AMBULATORY NON FORMULARY MEDICATION, Black Cohosh, diclofenac sodium, Bioflex, vitamin C, and Vitamin D (Ergocalciferol).  Meds ordered this encounter  Medications   terbinafine (LAMISIL) 250 MG tablet    Sig: Take 1 tablet (250 mg total) by mouth daily.    Dispense:  30 tablet    Refill:  0    Order Specific Question:   Supervising Provider    Answer:   Danise Edge A [[id number]]

## 2023-04-25 ENCOUNTER — Encounter: Payer: Self-pay | Admitting: Family

## 2023-04-25 LAB — CYTOLOGY - PAP
Diagnosis: NEGATIVE
Diagnosis: REACTIVE
High risk HPV: NEGATIVE

## 2023-04-26 ENCOUNTER — Other Ambulatory Visit: Payer: Self-pay | Admitting: Family

## 2023-04-26 ENCOUNTER — Other Ambulatory Visit (HOSPITAL_COMMUNITY): Payer: Self-pay

## 2023-04-26 MED ORDER — SIMVASTATIN 10 MG PO TABS
10.0000 mg | ORAL_TABLET | Freq: Every day | ORAL | 3 refills | Status: DC
  Filled 2023-04-26: qty 30, 30d supply, fill #0
  Filled 2023-05-23: qty 30, 30d supply, fill #1
  Filled 2023-06-21: qty 30, 30d supply, fill #2
  Filled 2023-07-31: qty 30, 30d supply, fill #3

## 2023-05-01 ENCOUNTER — Other Ambulatory Visit (HOSPITAL_COMMUNITY)
Admission: RE | Admit: 2023-05-01 | Discharge: 2023-05-01 | Disposition: A | Discharge status: Home / Self Care | Payer: Self-pay | Source: Ambulatory Visit | Attending: Oncology | Admitting: Oncology

## 2023-05-01 DIAGNOSIS — Z006 Encounter for examination for normal comparison and control in clinical research program: Secondary | ICD-10-CM | POA: Insufficient documentation

## 2023-05-23 ENCOUNTER — Other Ambulatory Visit (HOSPITAL_COMMUNITY): Payer: Self-pay

## 2023-05-23 ENCOUNTER — Other Ambulatory Visit: Payer: Self-pay

## 2023-05-23 ENCOUNTER — Other Ambulatory Visit (INDEPENDENT_AMBULATORY_CARE_PROVIDER_SITE_OTHER): Payer: 59

## 2023-05-23 ENCOUNTER — Telehealth: Payer: Self-pay | Admitting: Family

## 2023-05-23 ENCOUNTER — Other Ambulatory Visit: Payer: Self-pay | Admitting: Family

## 2023-05-23 DIAGNOSIS — B351 Tinea unguium: Secondary | ICD-10-CM | POA: Diagnosis not present

## 2023-05-23 NOTE — Telephone Encounter (Signed)
See mychart.  

## 2023-05-24 ENCOUNTER — Other Ambulatory Visit (HOSPITAL_COMMUNITY): Payer: Self-pay

## 2023-05-24 ENCOUNTER — Telehealth: Payer: Self-pay | Admitting: Family

## 2023-05-24 DIAGNOSIS — B351 Tinea unguium: Secondary | ICD-10-CM

## 2023-05-24 LAB — HEPATIC FUNCTION PANEL
ALT: 20 U/L (ref 0–35)
AST: 16 U/L (ref 0–37)
Albumin: 4.3 g/dL (ref 3.5–5.2)
Alkaline Phosphatase: 66 U/L (ref 39–117)
Bilirubin, Direct: 0.1 mg/dL (ref 0.0–0.3)
Total Bilirubin: 0.4 mg/dL (ref 0.2–1.2)
Total Protein: 6.6 g/dL (ref 6.0–8.3)

## 2023-05-24 MED ORDER — TERBINAFINE HCL 250 MG PO TABS
250.0000 mg | ORAL_TABLET | Freq: Every day | ORAL | 1 refills | Status: DC
  Filled 2023-05-24: qty 30, 30d supply, fill #0
  Filled 2023-06-21: qty 30, 30d supply, fill #1

## 2023-05-24 NOTE — Telephone Encounter (Signed)
See mychart.  

## 2023-07-17 ENCOUNTER — Encounter: Payer: Self-pay | Admitting: Dermatology

## 2023-07-17 ENCOUNTER — Ambulatory Visit: Payer: 59 | Admitting: Dermatology

## 2023-07-17 VITALS — BP 129/68 | HR 73

## 2023-07-17 DIAGNOSIS — D229 Melanocytic nevi, unspecified: Secondary | ICD-10-CM

## 2023-07-17 DIAGNOSIS — Z808 Family history of malignant neoplasm of other organs or systems: Secondary | ICD-10-CM

## 2023-07-17 DIAGNOSIS — D1801 Hemangioma of skin and subcutaneous tissue: Secondary | ICD-10-CM

## 2023-07-17 DIAGNOSIS — L578 Other skin changes due to chronic exposure to nonionizing radiation: Secondary | ICD-10-CM

## 2023-07-17 DIAGNOSIS — L821 Other seborrheic keratosis: Secondary | ICD-10-CM | POA: Diagnosis not present

## 2023-07-17 DIAGNOSIS — W908XXA Exposure to other nonionizing radiation, initial encounter: Secondary | ICD-10-CM

## 2023-07-17 DIAGNOSIS — D2372 Other benign neoplasm of skin of left lower limb, including hip: Secondary | ICD-10-CM | POA: Diagnosis not present

## 2023-07-17 DIAGNOSIS — L738 Other specified follicular disorders: Secondary | ICD-10-CM

## 2023-07-17 DIAGNOSIS — L814 Other melanin hyperpigmentation: Secondary | ICD-10-CM

## 2023-07-17 DIAGNOSIS — Z1283 Encounter for screening for malignant neoplasm of skin: Secondary | ICD-10-CM

## 2023-07-17 DIAGNOSIS — D239 Other benign neoplasm of skin, unspecified: Secondary | ICD-10-CM

## 2023-07-17 NOTE — Progress Notes (Signed)
   New Patient Visit   Subjective  Alicia Payne is a 57 y.o. female who presents for the following: Skin Cancer Screening and Full Body Skin Exam  The patient presents for Total-Body Skin Exam (TBSE) for skin cancer screening and mole check. The patient has spots, moles and lesions to be evaluated, some may be new or changing and the patient may have concern these could be cancer. Pt has no hx of skin cancer but sister had MM- acral of the great toe, and passed away from it last year 17-Aug-2022)  The following portions of the chart were reviewed this encounter and updated as appropriate: medications, allergies, medical history  Review of Systems:  No other skin or systemic complaints except as noted in HPI or Assessment and Plan.  Objective  Well appearing patient in no apparent distress; mood and affect are within normal limits.  A full examination was performed including scalp, head, eyes, ears, nose, lips, neck, chest, axillae, abdomen, back, buttocks, bilateral upper extremities, bilateral lower extremities, hands, feet, fingers, toes, fingernails, and toenails. All findings within normal limits unless otherwise noted below.   Relevant physical exam findings are noted in the Assessment and Plan.    Assessment & Plan   SKIN CANCER SCREENING PERFORMED TODAY.  ACTINIC DAMAGE - Chronic condition, secondary to cumulative UV/sun exposure - diffuse scaly erythematous macules with underlying dyspigmentation - Recommend daily broad spectrum sunscreen SPF 30+ to sun-exposed areas, reapply every 2 hours as needed.  - Staying in the shade or wearing long sleeves, sun glasses (UVA+UVB protection) and wide brim hats (4-inch brim around the entire circumference of the hat) are also recommended for sun protection.  - Call for new or changing lesions.  MELANOCYTIC NEVI - Tan-brown and/or pink-flesh-colored symmetric macules and papules - Benign appearing on exam today - Observation -  Call clinic for new or changing moles - Recommend daily use of broad spectrum spf 30+ sunscreen to sun-exposed areas.   SEBORRHEIC KERATOSIS - Stuck-on, waxy, tan-brown papules and/or plaques  - Benign-appearing - Discussed benign etiology and prognosis. - Observe - Call for any changes  LENTIGINES Exam: scattered tan macules Due to sun exposure Treatment Plan: Benign-appearing, observe. Recommend daily broad spectrum sunscreen SPF 30+ to sun-exposed areas, reapply every 2 hours as needed.  Call for any changes    HEMANGIOMA Exam: red papule(s) Discussed benign nature. Recommend observation. Call for changes.   DERMATOFIBROMA left lower leg and left knee Exam: Firm pink/brown papulenodule with dimple sign. Treatment Plan: A dermatofibroma is a benign growth possibly related to trauma, such as an insect bite, cut from shaving, or inflamed acne-type bump.  Treatment options to remove include shave or excision with resulting scar and risk of recurrence.  Since benign-appearing and not bothersome, will observe for now.    Sebaceous Hyperplasia forehead - Small yellow papules with a central dell - Benign-appearing - Observe. Call for changes.    Return in about 1 year (around 07/16/2024) for TBSE.  I, Tillie Fantasia, CMA, am acting as scribe for Gwenith Daily, MD.   Documentation: I have reviewed the above documentation for accuracy and completeness, and I agree with the above.  Gwenith Daily, MD

## 2023-07-17 NOTE — Patient Instructions (Signed)

## 2023-10-29 ENCOUNTER — Other Ambulatory Visit: Payer: Self-pay | Admitting: Family

## 2023-10-29 ENCOUNTER — Other Ambulatory Visit (HOSPITAL_COMMUNITY): Payer: Self-pay

## 2023-10-29 ENCOUNTER — Telehealth: Payer: Commercial Managed Care - PPO | Admitting: Physician Assistant

## 2023-10-29 DIAGNOSIS — R3989 Other symptoms and signs involving the genitourinary system: Secondary | ICD-10-CM | POA: Diagnosis not present

## 2023-10-29 DIAGNOSIS — Z1231 Encounter for screening mammogram for malignant neoplasm of breast: Secondary | ICD-10-CM

## 2023-10-29 MED ORDER — CEPHALEXIN 500 MG PO CAPS
500.0000 mg | ORAL_CAPSULE | Freq: Two times a day (BID) | ORAL | 0 refills | Status: AC
  Filled 2023-10-29: qty 14, 7d supply, fill #0

## 2023-10-29 NOTE — Progress Notes (Signed)

## 2023-10-29 NOTE — Progress Notes (Signed)
I have spent 5 minutes in review of e-visit questionnaire, review and updating patient chart, medical decision making and response to patient.   Piedad Climes, PA-C

## 2023-11-08 ENCOUNTER — Other Ambulatory Visit (HOSPITAL_COMMUNITY): Payer: Self-pay

## 2023-11-29 ENCOUNTER — Ambulatory Visit
Admission: RE | Admit: 2023-11-29 | Discharge: 2023-11-29 | Disposition: A | Discharge status: Home / Self Care | Payer: Commercial Managed Care - PPO | Source: Ambulatory Visit | Attending: Family | Admitting: Family

## 2023-11-29 DIAGNOSIS — Z1231 Encounter for screening mammogram for malignant neoplasm of breast: Secondary | ICD-10-CM | POA: Diagnosis not present

## 2024-01-06 ENCOUNTER — Encounter: Payer: Self-pay | Admitting: Internal Medicine

## 2024-04-06 ENCOUNTER — Encounter

## 2024-04-08 ENCOUNTER — Ambulatory Visit (AMBULATORY_SURGERY_CENTER)

## 2024-04-08 ENCOUNTER — Other Ambulatory Visit (HOSPITAL_COMMUNITY): Payer: Self-pay

## 2024-04-08 ENCOUNTER — Encounter: Payer: Self-pay | Admitting: Internal Medicine

## 2024-04-08 VITALS — Ht 67.5 in | Wt 226.0 lb

## 2024-04-08 DIAGNOSIS — Z860100 Personal history of colon polyps, unspecified: Secondary | ICD-10-CM

## 2024-04-08 MED ORDER — NA SULFATE-K SULFATE-MG SULF 17.5-3.13-1.6 GM/177ML PO SOLN
1.0000 | Freq: Once | ORAL | 0 refills | Status: AC
Start: 2024-04-08 — End: 2024-04-14
  Filled 2024-04-08: qty 354, 2d supply, fill #0

## 2024-04-08 NOTE — Progress Notes (Signed)

## 2024-04-20 ENCOUNTER — Encounter: Payer: Self-pay | Admitting: Internal Medicine

## 2024-04-20 ENCOUNTER — Ambulatory Visit (AMBULATORY_SURGERY_CENTER): Admitting: Internal Medicine

## 2024-04-20 VITALS — BP 120/50 | HR 59 | Temp 97.8°F | Resp 16 | Ht 67.5 in | Wt 226.0 lb

## 2024-04-20 DIAGNOSIS — K648 Other hemorrhoids: Secondary | ICD-10-CM | POA: Diagnosis not present

## 2024-04-20 DIAGNOSIS — Z1211 Encounter for screening for malignant neoplasm of colon: Secondary | ICD-10-CM

## 2024-04-20 DIAGNOSIS — D122 Benign neoplasm of ascending colon: Secondary | ICD-10-CM

## 2024-04-20 DIAGNOSIS — E669 Obesity, unspecified: Secondary | ICD-10-CM | POA: Diagnosis not present

## 2024-04-20 DIAGNOSIS — D125 Benign neoplasm of sigmoid colon: Secondary | ICD-10-CM

## 2024-04-20 DIAGNOSIS — D123 Benign neoplasm of transverse colon: Secondary | ICD-10-CM

## 2024-04-20 DIAGNOSIS — Z860101 Personal history of adenomatous and serrated colon polyps: Secondary | ICD-10-CM | POA: Diagnosis not present

## 2024-04-20 DIAGNOSIS — E785 Hyperlipidemia, unspecified: Secondary | ICD-10-CM | POA: Diagnosis not present

## 2024-04-20 DIAGNOSIS — Z860100 Personal history of colon polyps, unspecified: Secondary | ICD-10-CM

## 2024-04-20 DIAGNOSIS — K635 Polyp of colon: Secondary | ICD-10-CM | POA: Diagnosis not present

## 2024-04-20 MED ORDER — SODIUM CHLORIDE 0.9 % IV SOLN: 500.0000 mL | Freq: Once | INTRAVENOUS | Status: DC

## 2024-04-20 NOTE — Progress Notes (Signed)
 Called to room to assist during endoscopic procedure.  Patient ID and intended procedure confirmed with present staff. Received instructions for my participation in the procedure from the performing physician.

## 2024-04-20 NOTE — Patient Instructions (Signed)
 Resume previous diet and medications. Awaiting pathology results. Repeat Colonoscopy date to be determined based on pathology results. Handout provided on colon polyps and hemorrhoids.  YOU HAD AN ENDOSCOPIC PROCEDURE TODAY AT THE [facility]:   Refer to the procedure report that was given to you for any specific questions about what was found during the examination.  If the procedure report does not answer your questions, please call your gastroenterologist to clarify.  If you requested that your care partner not be given the details of your procedure findings, then the procedure report has been included in a sealed envelope for you to review at your convenience later.  YOU SHOULD EXPECT: Some feelings of bloating in the abdomen. Passage of more gas than usual.  Walking can help get rid of the air that was put into your GI tract during the procedure and reduce the bloating. If you had a lower endoscopy (such as a colonoscopy or flexible sigmoidoscopy) you may notice spotting of blood in your stool or on the toilet paper. If you underwent a bowel prep for your procedure, you may not have a normal bowel movement for a few days.  Please Note:  You might notice some irritation and congestion in your nose or some drainage.  This is from the oxygen used during your procedure.  There is no need for concern and it should clear up in a day or so.  SYMPTOMS TO REPORT IMMEDIATELY:  Following lower endoscopy (colonoscopy or flexible sigmoidoscopy):  Excessive amounts of blood in the stool  Significant tenderness or worsening of abdominal pains  Swelling of the abdomen that is new, acute  Fever of 100F or higher  For urgent or emergent issues, a gastroenterologist can be reached at any hour by calling [phone]. Do not use MyChart messaging for urgent concerns.    DIET:  We do recommend a small meal at first, but then you may proceed to your regular diet.  Drink plenty of fluids but you  should avoid alcoholic beverages for 24 hours.  ACTIVITY:  You should plan to take it easy for the rest of today and you should NOT DRIVE or use heavy machinery until tomorrow (because of the sedation medicines used during the test).    FOLLOW UP: Our staff will call the number listed on your records the next business day following your procedure.  We will call around 7:15- 8:00 am to check on you and address any questions or concerns that you may have regarding the information given to you following your procedure. If we do not reach you, we will leave a message.     If any biopsies were taken you will be contacted by phone or by letter within the next 1-3 weeks.  Please call us at [phone] if you have not heard about the biopsies in 3 weeks.    SIGNATURES/CONFIDENTIALITY: You and/or your care partner have signed paperwork which will be entered into your electronic medical record.  These signatures attest to the fact that that the information above on your After Visit Summary has been reviewed and is understood.  Full responsibility of the confidentiality of this discharge information lies with you and/or your care-partner.

## 2024-04-20 NOTE — Progress Notes (Signed)
To PACU, VSS. Report to [email or web address] 

## 2024-04-20 NOTE — Op Note (Signed)
 [facility] Patient Name: Alicia Payne Procedure Date: 04/20/2024 10:14 AM MRN: [id number] Endoscopist: Rosario Estefana Kidney , , [id number] Age: 58 Referring MD:  Date of Birth: Jul 15, 1966 Gender: Female Account #: [id number] Procedure:                Colonoscopy Indications:              High risk colon cancer surveillance: Personal                            history of colonic polyps Medicines:                Monitored Anesthesia Care Procedure:                Pre-Anesthesia Assessment:                           - Prior to the procedure, a History and Physical                            was performed, and patient medications and                            allergies were reviewed. The patient's tolerance of                            previous anesthesia was also reviewed. The risks                            and benefits of the procedure and the sedation                            options and risks were discussed with the patient.                            All questions were answered, and informed consent                            was obtained. Prior Anticoagulants: The patient has                            taken no anticoagulant or antiplatelet agents. ASA                            Grade Assessment: II - A patient with mild systemic                            disease. After reviewing the risks and benefits,                            the patient was deemed in satisfactory condition to                            undergo the procedure.  After obtaining informed consent, the colonoscope                            was passed under direct vision. Throughout the                            procedure, the patient's blood pressure, pulse, and                            oxygen saturations were monitored continuously. The                            CF [id number] #[id number] was introduced through the anus                            and advanced to the the  terminal ileum. The                            colonoscopy was performed without difficulty. The                            patient tolerated the procedure well. The quality                            of the bowel preparation was excellent. The                            terminal ileum, ileocecal valve, appendiceal                            orifice, and rectum were photographed. Scope In: 10:27:58 AM Scope Out: 10:52:16 AM Scope Withdrawal Time: 0 hours 20 minutes 39 seconds  Total Procedure Duration: 0 hours 24 minutes 18 seconds  Findings:                 The terminal ileum appeared normal.                           Four sessile polyps were found in the transverse                            colon and ascending colon. The polyps were 3 to 6                            mm in size. These polyps were removed with a cold                            snare. Resection and retrieval were complete.                           A 5 mm polyp was found in the sigmoid colon. The                            polyp was  sessile. The polyp was removed with a                            cold snare. Resection and retrieval were complete.                           Non-bleeding internal hemorrhoids were found during                            retroflexion. Complications:            No immediate complications. Estimated Blood Loss:     Estimated blood loss was minimal. Impression:               - The examined portion of the ileum was normal.                           - Four 3 to 6 mm polyps in the transverse colon and                            in the ascending colon, removed with a cold snare.                            Resected and retrieved.                           - One 5 mm polyp in the sigmoid colon, removed with                            a cold snare. Resected and retrieved.                           - Non-bleeding internal hemorrhoids. Recommendation:           - Discharge patient to home (with  escort).                           - Await pathology results.                           - The findings and recommendations were discussed                            with the patient. Dr Estefana Federico Rosario Estefana Federico,  04/20/2024 10:56:08 AM

## 2024-04-20 NOTE — Progress Notes (Signed)
 Pt's states no medical or surgical changes since previsit or office visit.

## 2024-04-20 NOTE — Progress Notes (Signed)
 GASTROENTEROLOGY PROCEDURE H&P NOTE   Primary Care Physician: Daryl Setter, NP    Reason for Procedure:   History of colon polyps  Plan:    Colonoscopy  Patient is appropriate for endoscopic procedure(s) in the ambulatory ([facility]) setting.  The nature of the procedure, as well as the risks, benefits, and alternatives were carefully and thoroughly reviewed with the patient. Ample time for discussion and questions allowed. The patient understood, was satisfied, and agreed to proceed.     HPI: Alicia Payne is a 58 y.o. female who presents for colonoscopy for history of colon polyps. Denies blood in stools, changes in bowel habits, or unintentional weight loss. Denies family history of colon cancer.  Colonoscopy 02/03/21: - One 2 mm polyp in the rectum, removed with a cold snare. Resected and retrieved. - One 3 mm polyp in the sigmoid colon, removed with a cold snare. Resected and retrieved. - One 3 mm polyp in the transverse colon, removed with a cold snare. Resected and retrieved. - Three 2 to 4 mm polyps in the ascending colon, removed with a cold snare. Resected and retrieved. - One less than 1 mm polyp at the ileocecal valve, removed with a cold biopsy forceps. Resected and retrieved. - The examination was otherwise normal on direct and retroflexion views. 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 - SESSILE SERRATED POLYP WITHOUT CYTOLOGIC DYSPLASIA (X MULTIPLE).  Past Medical History:  Diagnosis Date   Arthritis    Cyst of thyroid  determined by ultrasound 03/10/2018   Hot flashes    Hyperlipidemia    Joint pain    Knee pain    left knee   Obesity    Phlebitis and thrombophlebitis of superficial vessels of right lower extremity 03/20/2017   Rosacea    Trouble in sleeping    Varicose vein of leg    Vitamin D  deficiency     Past  Surgical History:  Procedure Laterality Date   stab phlebectomy Right 08/22/2022   stab phlebectomy > 20 incisions right leg by Medford Blade MD   TUBAL LIGATION     UTERINE FIBROID SURGERY     VARICOSE VEIN SURGERY      Prior to Admission medications   Medication Sig Start Date End Date Taking? Authorizing Provider  simvastatin  (ZOCOR ) 10 MG tablet Take 1 tablet (10 mg total) by mouth at bedtime. Patient not taking: Reported on 04/08/2024 04/26/23   Douglass Caul B, FNP  AMBULATORY NON FORMULARY MEDICATION Knee-high, medium compression, graduated compression stockings. Apply to lower extremities. [email or web address], Zippered Compression Stockings, large circ, long length 02/11/17   Tommas Severa Norris, PA-C  Ascorbic Acid (VITAMIN C) 1000 MG tablet Take 1,000 mg by mouth daily. Patient not taking: Reported on 04/08/2024    [provider]  Bioflavonoid Products (BIOFLEX) TABS Take 1 tablet by mouth daily. 04/19/20   O'Sullivan, Melissa, NP  Black Cohosh 160 MG CAPS Take by mouth. Patient not taking: Reported on 04/08/2024    [provider]  Calcium Citrate-Vitamin D  (CALCIUM + D PO) Take 1 tablet by mouth daily.    [provider]  diclofenac  sodium (VOLTAREN ) 1 % GEL Apply 4 g topically 4 (four) times daily. To affected joint. Patient taking differently: Apply 4 g topically 4 (four) times daily as needed. To affected joint. 06/17/18   Corey, Evan S, MD  Omega-3 Fatty Acids (FISH OIL  PO) Take 1 capsule by mouth daily.    [provider]  Vitamin D , Ergocalciferol , (DRISDOL ) 1.25 MG (50000 UNIT) CAPS capsule Take 1 capsule (50,000 Units total) by mouth every 7 (seven) days. 09/17/22   Midge Sober, DO    Current Outpatient Medications  Medication Sig Dispense Refill   simvastatin  (ZOCOR ) 10 MG tablet Take 1 tablet (10 mg total) by mouth at bedtime. (Patient not taking: Reported on 04/08/2024) 30 tablet 3   AMBULATORY NON FORMULARY MEDICATION  Knee-high, medium compression, graduated compression stockings. Apply to lower extremities. [email or web address], Zippered Compression Stockings, large circ, long length 1 each 0   Ascorbic Acid (VITAMIN C) 1000 MG tablet Take 1,000 mg by mouth daily. (Patient not taking: Reported on 04/08/2024)     Bioflavonoid Products (BIOFLEX) TABS Take 1 tablet by mouth daily.     Black Cohosh 160 MG CAPS Take by mouth. (Patient not taking: Reported on 04/08/2024)     Calcium Citrate-Vitamin D  (CALCIUM + D PO) Take 1 tablet by mouth daily.     diclofenac  sodium (VOLTAREN ) 1 % GEL Apply 4 g topically 4 (four) times daily. To affected joint. (Patient taking differently: Apply 4 g topically 4 (four) times daily as needed. To affected joint.) 100 g 11   Omega-3 Fatty Acids (FISH OIL PO) Take 1 capsule by mouth daily.     Vitamin D , Ergocalciferol , (DRISDOL ) 1.25 MG (50000 UNIT) CAPS capsule Take 1 capsule (50,000 Units total) by mouth every 7 (seven) days. 12 capsule 0   Current Facility-Administered Medications  Medication Dose Route Frequency Provider Last Rate Last Admin   0.9 %  sodium chloride  infusion  500 mL Intravenous Once Federico Rosario BROCKS, MD        Allergies as of 04/20/2024   (No Known Allergies)    Family History  Problem Relation Age of Onset   Cancer Mother        thyroid    Thyroid  cancer Mother 16   Thyroid  nodules Sister        thyroidectomy   Melanoma Sister        stage 4   Colon polyps Neg Hx    Colon cancer Neg Hx    Esophageal cancer Neg Hx    Stomach cancer Neg Hx    Rectal cancer Neg Hx     Social History   Socioeconomic History   Marital status: Widowed    Spouse name: Louis   Number of children: 2   Years of education: Not on file   Highest education level: Not on file  Occupational History   Occupation: Sports coach: [employer]  Tobacco Use   Smoking status: Former    Current packs/day: 0.00    Average packs/day: 0.3 packs/day  for 14.0 years (4.2 ttl pk-yrs)    Types: Cigarettes    Start date: 6    Quit date: 1998    Years since quitting: 27.5   Smokeless tobacco: Never  Vaping Use   Vaping status: Never Used  Substance and Sexual Activity   Alcohol use: Yes    Comment: social drinker   Drug use: No   Sexual activity: Not Currently    Partners: Male  Other Topics Concern   Not on file  Social History Narrative   2 daughters- adult (older one in [place] and younger is in [place], [place])   Development worker, 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   Married (second marriage)   Grew up in [place]- moved [place] age age 57    Enjoys reading, movies, house projects/gardening, spending time with family   Dog   Husband has stage 4 bladder cancer diagnosed 2019- has urost   Social Drivers of Corporate investment banker Strain: Not on [employer] Insecurity: Not on file  Transportation Needs: Not on file  Physical Activity: Inactive (04/19/2020)   Exercise Vital Sign    Days of Exercise per Week: 0 days    Minutes of Exercise per Session: 0 min  Stress: Not on file  Social Connections: Unknown (02/10/2022)   [facility] from [facility]   Social Network    Social Network: Not on file  Intimate Partner Violence: Unknown (01/02/2022)   Received from [facility]   HITS    Physically Hurt: Not on file    Insult or Talk Down To: Not on file    Threaten Physical Harm: Not on file    Scream or Curse: Not on file    Physical Exam: Vital signs in last 24 hours: BP 135/78   Pulse 78   Temp 97.8 F (36.6 C)   Ht 5' 7.5 (1.715 m)   Wt 226 lb (102.5 kg)   LMP 11/20/2014   SpO2 98%   BMI 34.87 kg/m  GEN: NAD EYE: Sclerae anicteric ENT: MMM CV: Non-tachycardic Pulm: No increased work of breathing GI: Soft, NT/ND NEURO:  Alert & Oriented   Estefana Kidney, MD [facility]  04/20/2024 10:07 AM

## 2024-04-21 ENCOUNTER — Telehealth: Payer: Self-pay

## 2024-04-21 NOTE — Telephone Encounter (Signed)
Attempted to reach patient for post-procedure f/u call. No answer. Left message for her to please not hesitate to call if she has any questions/concerns regarding her care. 

## 2024-04-22 ENCOUNTER — Ambulatory Visit: Payer: Self-pay | Admitting: Internal Medicine

## 2024-04-22 LAB — SURGICAL PATHOLOGY

## 2024-04-24 ENCOUNTER — Encounter: Admitting: Family

## 2024-04-29 ENCOUNTER — Telehealth: Admitting: Physician Assistant

## 2024-04-29 ENCOUNTER — Other Ambulatory Visit (HOSPITAL_COMMUNITY): Payer: Self-pay

## 2024-04-29 DIAGNOSIS — R3989 Other symptoms and signs involving the genitourinary system: Secondary | ICD-10-CM | POA: Diagnosis not present

## 2024-04-29 MED ORDER — CEPHALEXIN 500 MG PO CAPS
500.0000 mg | ORAL_CAPSULE | Freq: Two times a day (BID) | ORAL | 0 refills | Status: AC
  Filled 2024-04-29: qty 14, 7d supply, fill #0

## 2024-04-29 NOTE — Progress Notes (Signed)
 I have spent 5 minutes in review of e-visit questionnaire, review and updating patient chart, medical decision making and response to patient.   Piedad Climes, PA-C

## 2024-04-29 NOTE — Progress Notes (Signed)

## 2024-05-22 ENCOUNTER — Encounter: Admitting: Family

## 2024-07-07 ENCOUNTER — Ambulatory Visit (INDEPENDENT_AMBULATORY_CARE_PROVIDER_SITE_OTHER): Admitting: Family

## 2024-07-07 ENCOUNTER — Other Ambulatory Visit (HOSPITAL_COMMUNITY): Payer: Self-pay

## 2024-07-07 VITALS — BP 114/58 | HR 70 | Temp 98.4°F | Resp 16 | Ht 67.0 in | Wt 230.0 lb

## 2024-07-07 DIAGNOSIS — F32A Depression, unspecified: Secondary | ICD-10-CM

## 2024-07-07 DIAGNOSIS — E559 Vitamin D deficiency, unspecified: Secondary | ICD-10-CM | POA: Diagnosis not present

## 2024-07-07 DIAGNOSIS — R739 Hyperglycemia, unspecified: Secondary | ICD-10-CM

## 2024-07-07 DIAGNOSIS — Z0000 Encounter for general adult medical examination without abnormal findings: Secondary | ICD-10-CM | POA: Diagnosis not present

## 2024-07-07 DIAGNOSIS — Z808 Family history of malignant neoplasm of other organs or systems: Secondary | ICD-10-CM

## 2024-07-07 DIAGNOSIS — E782 Mixed hyperlipidemia: Secondary | ICD-10-CM

## 2024-07-07 MED ORDER — ATORVASTATIN CALCIUM 20 MG PO TABS
20.0000 mg | ORAL_TABLET | Freq: Every day | ORAL | 1 refills | Status: AC
  Filled 2024-07-07: qty 90, 90d supply, fill #0
  Filled 2024-10-05: qty 90, 90d supply, fill #1

## 2024-07-07 MED ORDER — ESCITALOPRAM OXALATE 5 MG PO TABS
5.0000 mg | ORAL_TABLET | Freq: Every day | ORAL | 1 refills | Status: DC
  Filled 2024-07-07: qty 30, 30d supply, fill #0
  Filled 2024-08-03: qty 30, 30d supply, fill #1

## 2024-07-07 NOTE — Assessment & Plan Note (Signed)
  Increased irritability and decreased patience. - Prescribe Lexapro 5 mg daily. - Follow up in 6 weeks to assess response to Lexapro.

## 2024-07-07 NOTE — Assessment & Plan Note (Signed)
Not currently on supplement. Will update level.

## 2024-07-07 NOTE — Patient Instructions (Signed)
 VISIT SUMMARY:  Today, you had your annual physical exam. We discussed your vaccination status, weight gain, sleep issues, and overall wellness. We also reviewed your recent colonoscopy results and discussed your cholesterol management.  YOUR PLAN:  VACCINATIONS: You are uncertain about your hepatitis B vaccine series and interested in the Prevnar 20 vaccine for pneumonia. -Schedule Prevnar 20 vaccination when available or get it at a pharmacy. -Discuss COVID booster with pharmacy if desired.  HYPERLIPIDEMIA: Your cholesterol levels are high, and you have stopped taking simvastatin . -Check lipid panel today. -Prescribe atorvastatin after lipid panel results.  WEIGHT GAIN: You have gained 5-8 pounds over the past year due to late-night snacking related to insomnia. -Encourage healthier snack options like vegetables. -Discuss potential for increased physical activity.  INSOMNIA: You have difficulty falling asleep, leading to late-night snacking. -Consider healthier snack options and discuss potential for increased physical activity.  DEPRESSION: You are experiencing increased irritability and decreased patience. -Prescribe Lexapro 5 mg daily. -Follow up in 6 weeks to assess response to Lexapro.  VITAMIN D  DEFICIENCY, SUSPECTED: You are not currently taking vitamin D  supplements. -Check vitamin D  level today.  CALCIUM SCORE SCREENING: You are interested in calcium score screening for cardiovascular risk assessment. -Order calcium score screening at [facility] CV imaging.  GENERAL HEALTH MAINTENANCE: Routine wellness visit with satisfactory screening results. Family history of melanoma noted. -Ensure annual dermatology skin check. -Remove HIV and hepatitis C screening from list.

## 2024-07-07 NOTE — Assessment & Plan Note (Signed)
 Not currently on statin, update lipids, start atorvastatin.  Pt would also like to do a cardiac CT calcium score.

## 2024-07-07 NOTE — Assessment & Plan Note (Signed)
  Routine wellness visit with satisfactory screening results. Declined HIV and hepatitis C screening. Family history of melanoma noted. - Schedule Prevnar 20 vaccination when available or at pharmacy. - Flu shot at work - Discuss COVID booster with pharmacy if desired. - Ensure annual dermatology skin check. - Continue work on healthy diet/exercise/weight loss.

## 2024-07-07 NOTE — Progress Notes (Signed)
 Subjective:     Patient ID: Alicia Payne, female    DOB: October 16, 1965, 58 y.o.   MRN: [id number]  Chief Complaint  Patient presents with   Annual Exam    HPI  Discussed the use of AI scribe software for clinical note transcription with the patient, who gave verbal consent to proceed.  History of Present Illness  Alicia Payne is a 58 year old female who presents for an annual physical exam.  She is uncertain about the completion of her hepatitis B vaccine series, which is relevant due to her occupation involving IV procedures. She is aware of the Prevnar 20 vaccine for pneumonia but notes its unavailability at the clinic. She has received COVID vaccinations but is uncertain about getting a booster.  She experiences late-night snacking, particularly when unable to sleep, resulting in a weight gain of 5-8 pounds over the past year. She has tried valerian root for sleep with mixed results. Her exercise routine includes walking her dog and moving around at work, but she does not engage in structured workouts.  A colonoscopy in July revealed five benign but precancerous polyps. Her last Pap smear was in the summer of 2024, and her mammogram in February 2025 was normal. She is up to date with dental check-ups but had to reschedule her vision appointment due to insurance issues.  She stopped taking simvastatin  after running out and has not had her lipids checked since. Her last lipid panel showed a total cholesterol of 282 mg/dL and an LDL of 804 mg/dL. She is not currently taking any vitamin D  supplements but takes joint supplements and fish oil occasionally.  She reports increased irritability and decreased patience, which she attributes to stress and possibly post-menopausal changes. She enjoys her job and social activities but feels less enthusiastic about daily activities during the week. She has a supportive social network and engages in activities with her daughter  and friends on weekends.  Immunizations: candidate for Prevnar- we are out today.  Diet: overall healthy- snacks a lot Exercise: not much, walks the dog Colonoscopy:  Due 2025 Pap Smear: due 7/29 Mammogram: 2/25- normal Vision: due, scheduled Dental: up to date      Health Maintenance Due  Topic Date Due   Pneumococcal Vaccine: 50+ Years (1 of 1 - PCV) Never done   COVID-19 Vaccine (4 - 2025-26 season) 06/01/2024    Past Medical History:  Diagnosis Date   Arthritis    Cyst of thyroid  determined by ultrasound 03/10/2018   Hot flashes    Hyperlipidemia    Joint pain    Knee pain    left knee   Obesity    Phlebitis and thrombophlebitis of superficial vessels of right lower extremity 03/20/2017   Rosacea    Trouble in sleeping    Varicose vein of leg    Vitamin D  deficiency     Past Surgical History:  Procedure Laterality Date   stab phlebectomy Right 08/22/2022   stab phlebectomy > 20 incisions right leg by Medford Blade MD   TUBAL LIGATION     UTERINE FIBROID SURGERY     VARICOSE VEIN SURGERY      Family History  Problem Relation Age of Onset   Cancer Mother        thyroid    Thyroid  cancer Mother 61   Thyroid  nodules Sister        thyroidectomy   Melanoma Sister        stage 4  Cancer Sister    Cancer Maternal Grandmother    Colon polyps Neg Hx    Colon cancer Neg Hx    Esophageal cancer Neg Hx    Stomach cancer Neg Hx    Rectal cancer Neg Hx     Social History   Socioeconomic History   Marital status: Widowed    Spouse name: Louis   Number of children: 2   Years of education: Not on file   Highest education level: Associate degree: academic program  Occupational History   Occupation: Sports coach: [employer]  Tobacco Use   Smoking status: Former    Current packs/day: 0.00    Average packs/day: 0.3 packs/day for 14.0 years (4.2 ttl pk-yrs)    Types: Cigarettes    Start date: 109    Quit date: 1998     Years since quitting: 27.7   Smokeless tobacco: Never  Vaping Use   Vaping status: Never Used  Substance and Sexual Activity   Alcohol use: Yes    Comment: social drinker   Drug use: No   Sexual activity: Not Currently    Partners: Male  Other Topics Concern   Not on file  Social History Narrative   2 daughters- adult (older one in [place] and younger is in [place], [place])   Development worker, 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   Married (second marriage)   Grew up in [place]- moved to [place] age 89    Enjoys reading, movies, house projects/gardening, spending time with family   Dog   Husband has stage 4 bladder cancer diagnosed 2019- has urost   Social Drivers of Corporate investment banker Strain: Low Risk  (07/07/2024)   Overall Financial Resource Strain (CARDIA)    Difficulty of Paying Living Expenses: Not hard at all  Food Insecurity: No Food Insecurity (07/07/2024)   Hunger Vital Sign    Worried About Running Out of Food in the Last Year: Never true    Ran Out of Food in the Last Year: Never true  Transportation Needs: No Transportation Needs (07/07/2024)   PRAPARE - Administrator, Civil Service (Medical): No    [place] of Transportation (Non-Medical): No  Physical Activity: Insufficiently Active (07/07/2024)   Exercise Vital Sign    Days of Exercise per Week: 3 days    Minutes of Exercise per Session: 10 min  Stress: No Stress Concern Present (07/07/2024)   [employer] of Occupational Health - Occupational Stress Questionnaire    Feeling of Stress: Only a little  Social Connections: Moderately Integrated (07/07/2024)   Social Connection and Isolation Panel    Frequency of Communication with Friends and Family: More than three times a week    Frequency of Social Gatherings with Friends and Family: Twice a week    Attends Religious Services: More than 4 times per year    Active Member of [employer] or Organizations: Yes    Attends Banker Meetings: 1 to 4 times per year     Marital Status: Widowed  Intimate Partner Violence: Unknown (01/02/2022)   Received from [facility]   HITS    Physically Hurt: Not on file    Insult or Talk Down To: Not on file    Threaten Physical Harm: Not on file    Scream or Curse: Not on file    Outpatient Medications Prior to Visit  Medication Sig Dispense Refill   AMBULATORY NON FORMULARY MEDICATION Knee-high, medium compression, graduated  compression stockings. Apply to lower extremities. [email or web address], Zippered Compression Stockings, large circ, long length 1 each 0   Ascorbic Acid (VITAMIN C) 1000 MG tablet Take 1,000 mg by mouth daily.     Bioflavonoid Products (BIOFLEX) TABS Take 1 tablet by mouth daily.     Black Cohosh 160 MG CAPS Take by mouth.     Calcium Citrate-Vitamin D  (CALCIUM + D PO) Take 1 tablet by mouth daily.     diclofenac  sodium (VOLTAREN ) 1 % GEL Apply 4 g topically 4 (four) times daily. To affected joint. (Patient taking differently: Apply 4 g topically 4 (four) times daily as needed. To affected joint.) 100 g 11   Omega-3 Fatty Acids (FISH OIL PO) Take 1 capsule by mouth daily.     Vitamin D , Ergocalciferol , (DRISDOL ) 1.25 MG (50000 UNIT) CAPS capsule Take 1 capsule (50,000 Units total) by mouth every 7 (seven) days. (Patient not taking: Reported on 07/07/2024) 12 capsule 0   simvastatin  (ZOCOR ) 10 MG tablet Take 1 tablet (10 mg total) by mouth at bedtime. (Patient not taking: Reported on 04/20/2024) 30 tablet 3   No facility-administered medications prior to visit.    No Known Allergies  Review of Systems  Constitutional:  Negative for weight loss.  HENT:  Negative for congestion and hearing loss.   Eyes:  Negative for blurred vision.  Respiratory:  Negative for cough.   Cardiovascular:  Negative for leg swelling.  Gastrointestinal:  Negative for constipation and diarrhea.  Genitourinary:  Negative for dysuria and frequency.  Musculoskeletal:  Negative for joint pain and myalgias.   Skin:  Negative for rash.  Neurological:  Negative for headaches.  Psychiatric/Behavioral:  Positive for depression (see HPI). The patient is not nervous/anxious.        Objective:    Physical Exam   BP (!) 114/58 (BP Location: Right Arm, Patient Position: Sitting, Cuff Size: Large)   Pulse 70   Temp 98.4 F (36.9 C) (Oral)   Resp 16   Ht 5' 7 (1.702 m)   Wt 230 lb (104.3 kg)   LMP 11/20/2014   SpO2 98%   BMI 36.02 kg/m  Wt Readings from Last 3 Encounters:  07/07/24 230 lb (104.3 kg)  04/20/24 226 lb (102.5 kg)  04/08/24 226 lb (102.5 kg)   Physical Exam  Constitutional: She is oriented to person, place, and time. She appears well-developed and well-nourished. No distress.  HENT:  Head: Normocephalic and atraumatic.  Right Ear: Tympanic membrane and ear canal normal.  Left Ear: Tympanic membrane and ear canal normal.  Mouth/Throat: Oropharynx is clear and moist.  Eyes: Pupils are equal, round, and reactive to light. No scleral icterus.  Neck: Normal range of motion. No thyromegaly present.  Cardiovascular: Normal rate and regular rhythm.   No murmur heard. Pulmonary/Chest: Effort normal and breath sounds normal. No respiratory distress. He has no wheezes. She has no rales. She exhibits no tenderness.  Abdominal: Soft. Bowel sounds are normal. She exhibits no distension and no mass. There is no tenderness. There is no rebound and no guarding.  Musculoskeletal: She exhibits no edema.  Lymphadenopathy:    She has no cervical adenopathy.  Neurological: She is alert and oriented to person, place, and time. She has normal patellar reflexes. She exhibits normal muscle tone. Coordination normal.  Skin: Skin is warm and dry.  Psychiatric: She has a normal mood and affect. Her behavior is normal. Judgment and thought content normal.  Breast/Pelvic: deferred  Assessment & Plan:       Assessment & Plan:   Problem List Items Addressed This Visit        Unprioritized   Vitamin D  deficiency   Not currently on supplement. Will update level.       Relevant Orders   Vitamin D  (25 hydroxy)   Preventative health care - Primary    Routine wellness visit with satisfactory screening results. Declined HIV and hepatitis C screening. Family history of melanoma noted. - Schedule Prevnar 20 vaccination when available or at pharmacy. - Flu shot at work - Discuss COVID booster with pharmacy if desired. - Ensure annual dermatology skin check. - Continue work on healthy diet/exercise/weight loss.        Mixed hyperlipidemia   Not currently on statin, update lipids, start atorvastatin.  Pt would also like to do a cardiac CT calcium score.        Relevant Medications   atorvastatin (LIPITOR) 20 MG tablet   Other Relevant Orders   Lipid panel   CT CARDIAC SCORING (SELF PAY ONLY)   Mild depressive disorder    Increased irritability and decreased patience. - Prescribe Lexapro 5 mg daily. - Follow up in 6 weeks to assess response to Lexapro.      Relevant Medications   escitalopram (LEXAPRO) 5 MG tablet   Hyperglycemia   Relevant Orders   HgB A1c   Comp Met (CMET)   Family history of melanoma   Advised pt to schedule her annual derm follow up visit.        I have discontinued Shatonia H. Mceachern's simvastatin . I am also having her start on atorvastatin and escitalopram. Additionally, I am having her maintain her Omega-3 Fatty Acids (FISH OIL PO), Calcium Citrate-Vitamin D  (CALCIUM + D PO), AMBULATORY NON FORMULARY MEDICATION, Black Cohosh, diclofenac  sodium, Bioflex, vitamin C, and Vitamin D  (Ergocalciferol ).  Meds ordered this encounter  Medications   atorvastatin (LIPITOR) 20 MG tablet    Sig: Take 1 tablet (20 mg total) by mouth daily.    Dispense:  90 tablet    Refill:  1    Supervising Provider:   DOMENICA BLACKBIRD A [[id number]]   escitalopram (LEXAPRO) 5 MG tablet    Sig: Take 1 tablet (5 mg total) by mouth daily.    Dispense:  30 tablet     Refill:  1    Supervising Provider:   DOMENICA BLACKBIRD A [[id number]]

## 2024-07-07 NOTE — Assessment & Plan Note (Signed)
 Advised pt to schedule her annual derm follow up visit.

## 2024-07-07 NOTE — Assessment & Plan Note (Deleted)
 Not currently on statin, update lipids, start atorvastatin.  Pt would also like to do a cardiac CT calcium score.

## 2024-07-08 ENCOUNTER — Other Ambulatory Visit (HOSPITAL_COMMUNITY): Payer: Self-pay

## 2024-07-08 ENCOUNTER — Ambulatory Visit: Payer: Self-pay | Admitting: Family

## 2024-07-08 DIAGNOSIS — R931 Abnormal findings on diagnostic imaging of heart and coronary circulation: Secondary | ICD-10-CM

## 2024-07-08 DIAGNOSIS — I7121 Aneurysm of the ascending aorta, without rupture: Secondary | ICD-10-CM

## 2024-07-08 DIAGNOSIS — E782 Mixed hyperlipidemia: Secondary | ICD-10-CM

## 2024-07-08 DIAGNOSIS — E559 Vitamin D deficiency, unspecified: Secondary | ICD-10-CM

## 2024-07-08 LAB — COMPREHENSIVE METABOLIC PANEL WITH GFR
ALT: 17 U/L (ref 0–35)
AST: 12 U/L (ref 0–37)
Albumin: 4.5 g/dL (ref 3.5–5.2)
Alkaline Phosphatase: 59 U/L (ref 39–117)
BUN: 16 mg/dL (ref 6–23)
CO2: 28 meq/L (ref 19–32)
Calcium: 9.3 mg/dL (ref 8.4–10.5)
Chloride: 102 meq/L (ref 96–112)
Creatinine, Ser: 0.68 mg/dL (ref 0.40–1.20)
GFR: 95.6 mL/min (ref >60.00)
Glucose, Bld: 99 mg/dL (ref 70–99)
Potassium: 3.9 meq/L (ref 3.5–5.1)
Sodium: 138 meq/L (ref 135–145)
Total Bilirubin: 0.5 mg/dL (ref 0.2–1.2)
Total Protein: 6.6 g/dL (ref 6.0–8.3)

## 2024-07-08 LAB — LIPID PANEL
Cholesterol: 271 mg/dL — ABNORMAL HIGH (ref 0–200)
HDL: 53.9 mg/dL (ref >39.00)
LDL Cholesterol: 165 mg/dL — ABNORMAL HIGH (ref 0–99)
NonHDL: 216.91
Total CHOL/HDL Ratio: 5
Triglycerides: 258 mg/dL — ABNORMAL HIGH (ref 0.0–149.0)
VLDL: 51.6 mg/dL — ABNORMAL HIGH (ref 0.0–40.0)

## 2024-07-08 LAB — VITAMIN D 25 HYDROXY (VIT D DEFICIENCY, FRACTURES): VITD: 25.08 ng/mL — ABNORMAL LOW (ref 30.00–100.00)

## 2024-07-08 LAB — HEMOGLOBIN A1C: Hgb A1c MFr Bld: 6.1 % (ref 4.6–6.5)

## 2024-07-08 MED ORDER — VITAMIN D (ERGOCALCIFEROL) 1.25 MG (50000 UNIT) PO CAPS
50000.0000 [IU] | ORAL_CAPSULE | ORAL | 0 refills | Status: AC
  Filled 2024-07-08: qty 12, 84d supply, fill #0

## 2024-07-09 ENCOUNTER — Ambulatory Visit (HOSPITAL_COMMUNITY)
Admission: RE | Admit: 2024-07-09 | Discharge: 2024-07-09 | Disposition: A | Discharge status: Home / Self Care | Payer: Self-pay | Source: Ambulatory Visit | Attending: Family | Admitting: Family

## 2024-07-09 DIAGNOSIS — I2510 Atherosclerotic heart disease of native coronary artery without angina pectoris: Secondary | ICD-10-CM | POA: Diagnosis not present

## 2024-07-09 DIAGNOSIS — E782 Mixed hyperlipidemia: Secondary | ICD-10-CM | POA: Insufficient documentation

## 2024-07-10 DIAGNOSIS — R931 Abnormal findings on diagnostic imaging of heart and coronary circulation: Secondary | ICD-10-CM | POA: Insufficient documentation

## 2024-07-10 DIAGNOSIS — I7121 Aneurysm of the ascending aorta, without rupture: Secondary | ICD-10-CM | POA: Insufficient documentation

## 2024-07-10 NOTE — Telephone Encounter (Signed)
 Please advise pt that her cardiac CT is showing a high calcium score.  As we discussed at her visit, I would like her to start atorvastatin.  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  We should plan to repeat CT in 1 year.    I will also refer her to cardiology to see if they want to do any further testing such as stress test.

## 2024-07-10 NOTE — Telephone Encounter (Signed)
 Pt called back.  I advised her of cardiology refill and that we will repeat CT in one year.  She has already started the Atorvastatin.

## 2024-07-10 NOTE — Telephone Encounter (Signed)
 Called patient but no answer, left voice mail for patient to call back.

## 2024-07-10 NOTE — Addendum Note (Signed)
 Addended by: DARYL SETTER on: 07/10/2024 09:33 AM   Modules accepted: Orders

## 2024-08-19 ENCOUNTER — Other Ambulatory Visit (HOSPITAL_COMMUNITY): Payer: Self-pay

## 2024-08-19 ENCOUNTER — Ambulatory Visit: Admitting: Family

## 2024-08-19 VITALS — BP 120/70 | HR 70 | Temp 97.8°F | Resp 16 | Ht 67.0 in | Wt 228.6 lb

## 2024-08-19 DIAGNOSIS — F32A Depression, unspecified: Secondary | ICD-10-CM | POA: Diagnosis not present

## 2024-08-19 DIAGNOSIS — Z23 Encounter for immunization: Secondary | ICD-10-CM

## 2024-08-19 DIAGNOSIS — R635 Abnormal weight gain: Secondary | ICD-10-CM | POA: Diagnosis not present

## 2024-08-19 DIAGNOSIS — E782 Mixed hyperlipidemia: Secondary | ICD-10-CM

## 2024-08-19 MED ORDER — ESCITALOPRAM OXALATE 5 MG PO TABS
5.0000 mg | ORAL_TABLET | Freq: Every day | ORAL | 1 refills | Status: AC
  Filled 2024-08-19 – 2024-09-01 (×2): qty 90, 90d supply, fill #0
  Filled 2024-10-05: qty 90, 90d supply, fill #1

## 2024-08-19 NOTE — Assessment & Plan Note (Signed)
 Calcium score of 155 on cardiac CT. Current treatment with Lipitor. Goal for total cholesterol is under 200 and LDL under 100. - Continue Lipitor. - Recheck cholesterol levels in one year. - Consider increasing Lipitor dose if LDL is around 120.

## 2024-08-19 NOTE — Patient Instructions (Signed)
  VISIT SUMMARY: Today, we discussed your increased calcium score, irritability, and weight management. Your calcium score is 155, and you have a slightly dilated aorta. Your irritability has improved with Lexapro, and you are working on weight management through diet and exercise. We also reviewed your general health maintenance, including vaccination updates.  YOUR PLAN: -MIXED HYPERLIPIDEMIA: Mixed hyperlipidemia means you have high levels of different types of fats in your blood. You should continue taking Lipitor to manage your cholesterol levels. We aim to keep your total cholesterol under 200 and LDL under 100. We will recheck your cholesterol levels in one year and may consider increasing your Lipitor dose if your LDL is around 120.  -DEPRESSION: You have mild depression, which is being managed with Lexapro. You have reported improvement in your irritability and mood. Continue taking Lexapro at your current dose. A 90-day supply has been sent to your pharmacy.  -ABNORMAL WEIGHT GAIN: Your weight gain may be related to menopause and hormonal changes. You are currently focusing on dietary changes and have recently started exercising. Your Lexapro dose is low and unlikely to contribute significantly to weight gain. Your thyroid  function was normal in the past, but we have rechecked it to be sure. Continue focusing on your evening routine and maintain your exercise regimen.  -GENERAL HEALTH MAINTENANCE: For your general health, we recommend routine vaccinations and lab checks. You received the Prevnar 20 vaccine today, which is recommended for those aged 41 and above. You will need a repeat vaccine in five years.  INSTRUCTIONS: Please continue taking Lipitor and Lexapro as prescribed. Maintain your current diet and exercise routine. We will recheck your cholesterol levels in one year. If you have any concerns or notice any changes in your health, please schedule a follow-up appointment. You will  need a repeat pneumonia vaccine in five years.

## 2024-08-19 NOTE — Assessment & Plan Note (Signed)
  Weight gain possibly related to menopause and hormonal changes. Current weight loss efforts include dietary changes and recent initiation of exercise. Lexapro  dose is low, unlikely to contribute significantly. Thyroid  function was normal in the past, but recheck offered. - Focus on evening routine and maintain exercise regimen. - Rechecked thyroid  function.

## 2024-08-19 NOTE — Progress Notes (Signed)
 Subjective:     Patient ID: Alicia Payne, female    DOB: May 07, 1966, 58 y.o.   MRN: [id number]  Chief Complaint  Patient presents with   Depression   Follow-up    Depression         Discussed the use of AI scribe software for clinical note transcription with the patient, who gave verbal consent to proceed.  History of Present Illness Alicia Payne is a 58 year old female who presents for follow-up of increased calcium score and irritability.  She has a calcium score of 155 from a previous cardiac CT scan, which also noted a slightly dilated aorta.  She has been experiencing irritability, which has improved with the use of a low dose of Lexapro. She no longer feels the 'ache' she used to have.  Despite following a diet plan, she is concerned about weight management and has recently started exercising with a friend to improve her activity level. She notes difficulty in losing weight, with fluctuations of one to two pounds, and attributes some challenges to menopause, although she had her last period ten years ago. Her diet plan is not calorie-based but focuses on low fat, very little carbs, and lean protein. She struggles with cravings in the evening.  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 She has started exercising recently, motivated by a friend, and aims to maintain mobility as she approaches retirement.  No recent thyroid  issues noted; previous checks were normal.      Health Maintenance Due  Topic Date Due   COVID-19 Vaccine (4 - 2025-26 season) 06/01/2024    Past Medical History:  Diagnosis Date   Arthritis    Cyst of thyroid  determined by ultrasound 03/10/2018   Hot flashes    Hyperlipidemia    Joint pain    Knee pain    left knee   Obesity    Phlebitis and thrombophlebitis of superficial vessels of right lower extremity 03/20/2017   Rosacea    Trouble in sleeping    Varicose vein of leg    Vitamin D   deficiency     Past Surgical History:  Procedure Laterality Date   stab phlebectomy Right 08/22/2022   stab phlebectomy > 20 incisions right leg by Medford Blade MD   TUBAL LIGATION     UTERINE FIBROID SURGERY     VARICOSE VEIN SURGERY      Family History  Problem Relation Age of Onset   Cancer Mother        thyroid    Thyroid  cancer Mother 37   Thyroid  nodules Sister        thyroidectomy   Melanoma Sister        stage 4   Cancer Sister    Cancer Maternal Grandmother    Colon polyps Neg Hx    Colon cancer Neg Hx    Esophageal cancer Neg Hx    Stomach cancer Neg Hx    Rectal cancer Neg Hx     Social History   Socioeconomic History   Marital status: Widowed    Spouse name: Louis   Number of children: 2   Years of education: Not on file   Highest education level: Associate degree: academic program  Occupational History   Occupation: Sports Coach:   Tobacco Use   Smoking status: Former    Current packs/day: 0.00    Average packs/day: 0.3 packs/day for 14.0 years (  4.2 ttl pk-yrs)    Types: Cigarettes    Start date: 70    Quit date: 58    Years since quitting: 27.9   Smokeless tobacco: Never  Vaping Use   Vaping status: Never Used  Substance and Sexual Activity   Alcohol use: Yes    Comment: social drinker   Drug use: No   Sexual activity: Not Currently    Partners: Male  Other Topics Concern   Not on file  Social History Narrative   2 daughters- adult (older one in [place] and younger is in [place], [place])   Development worker, 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   Married (second marriage)   Grew up in [place]- moved to [place]  at age age 6    Enjoys reading, movies, house projects/gardening, spending time with family   Dog   Husband has stage 4 bladder cancer diagnosed 2019- has urost   Social Drivers of Corporate Investment Banker Strain: Low Risk  (07/07/2024)   Overall Financial Resource Strain (CARDIA)    Difficulty of Paying  Living Expenses: Not hard at all  Food Insecurity: No Food Insecurity (07/07/2024)   Hunger Vital Sign    Worried About Running Out of Food in the Last Year: Never true    Ran Out of Food in the Last Year: Never true  Transportation Needs: No Transportation Needs (07/07/2024)   PRAPARE - Administrator, Civil Service (Medical): No    [place] of Transportation (Non-Medical): No  Physical Activity: Insufficiently Active (07/07/2024)   Exercise Vital Sign    Days of Exercise per Week: 3 days    Minutes of Exercise per Session: 10 min  Stress: No Stress Concern Present (07/07/2024)   [employer] of Occupational Health - Occupational Stress Questionnaire    Feeling of Stress: Only a little  Social Connections: Moderately Integrated (07/07/2024)   Social Connection and Isolation Panel    Frequency of Communication with Friends and Family: More than three times a week    Frequency of Social Gatherings with Friends and Family: Twice a week    Attends Religious Services: More than 4 times per year    Active Member of [employer] or Organizations: Yes    Attends Banker Meetings: 1 to 4 times per year    Marital Status: Widowed  Intimate Partner Violence: Unknown (01/02/2022)   Received from [facility]   HITS    Physically Hurt: Not on file    Insult or Talk Down To: Not on file    Threaten Physical Harm: Not on file    Scream or Curse: Not on file    Outpatient Medications Prior to Visit  Medication Sig Dispense Refill   AMBULATORY NON FORMULARY MEDICATION Knee-high, medium compression, graduated compression stockings. Apply to lower extremities. [email or web address], Zippered Compression Stockings, large circ, long length 1 each 0   Ascorbic Acid (VITAMIN C) 1000 MG tablet Take 1,000 mg by mouth daily.     atorvastatin  (LIPITOR) 20 MG tablet Take 1 tablet (20 mg total) by mouth daily. 90 tablet 1   Bioflavonoid Products (BIOFLEX) TABS Take 1 tablet by mouth daily.      Black Cohosh 160 MG CAPS Take by mouth.     Calcium  Citrate-Vitamin D  (CALCIUM  + D PO) Take 1 tablet by mouth daily.     diclofenac  sodium (VOLTAREN ) 1 % GEL Apply 4 g topically 4 (four) times daily. To affected joint. (Patient taking differently: Apply 4 g topically 4 (four) times daily  as needed. To affected joint.) 100 g 11   Omega-3 Fatty Acids (FISH OIL PO) Take 1 capsule by mouth daily.     Vitamin D , Ergocalciferol , (DRISDOL ) 1.25 MG (50000 UNIT) CAPS capsule Take 1 capsule (50,000 Units total) by mouth every 7 (seven) days. 12 capsule 0   escitalopram (LEXAPRO) 5 MG tablet Take 1 tablet (5 mg total) by mouth daily. 30 tablet 1   No facility-administered medications prior to visit.    No Known Allergies  Review of Systems  Psychiatric/Behavioral:  Positive for depression.       See HPI Objective:    Physical Exam Constitutional:      General: She is not in acute distress.    Appearance: Normal appearance. She is well-developed.  HENT:     Head: Normocephalic and atraumatic.     Right Ear: External ear normal.     Left Ear: External ear normal.  Eyes:     General: No scleral icterus. Neck:     Thyroid : No thyromegaly.  Cardiovascular:     Rate and Rhythm: Normal rate and regular rhythm.     Heart sounds: Normal heart sounds. No murmur heard. Pulmonary:     Effort: Pulmonary effort is normal. No respiratory distress.     Breath sounds: Normal breath sounds. No wheezing.  Musculoskeletal:     Cervical back: Neck supple.  Skin:    General: Skin is warm and dry.  Neurological:     Mental Status: She is alert and oriented to person, place, and time.  Psychiatric:        Mood and Affect: Mood normal.        Behavior: Behavior normal.        Thought Content: Thought content normal.        Judgment: Judgment normal.      BP 120/70 (BP Location: Left Arm, Patient Position: Sitting, Cuff Size: Large)   Pulse 70   Temp 97.8 F (36.6 C) (Oral)   Resp 16   Ht 5'  7 (1.702 m)   Wt 228 lb 9.6 oz (103.7 kg)   LMP 11/20/2014   SpO2 96%   BMI 35.80 kg/m  Wt Readings from Last 3 Encounters:  08/19/24 228 lb 9.6 oz (103.7 kg)  07/07/24 230 lb (104.3 kg)  04/20/24 226 lb (102.5 kg)       Assessment & Plan:   Problem List Items Addressed This Visit       Unprioritized   Weight gain - Primary    Weight gain possibly related to menopause and hormonal changes. Current weight loss efforts include dietary changes and recent initiation of exercise. Lexapro dose is low, unlikely to contribute significantly. Thyroid  function was normal in the past, but recheck offered. - Focus on evening routine and maintain exercise regimen. - Rechecked thyroid  function.      Relevant Orders   TSH   Mixed hyperlipidemia   Calcium score of 155 on cardiac CT. Current treatment with Lipitor. Goal for total cholesterol is under 200 and LDL under 100. - Continue Lipitor. - Recheck cholesterol levels in one year. - Consider increasing Lipitor dose if LDL is around 120.      Relevant Orders   Lipid panel   Mild depressive disorder    Mild depressive disorder managed with Lexapro. Reports improvement in irritability and mood. Current dose is effective. - Continue Lexapro at current dose. - Sent 90-day supply of Lexapro to pharmacy.       Relevant  Medications   escitalopram  (LEXAPRO ) 5 MG tablet   Other Visit Diagnoses       Need for pneumococcal 20-valent conjugate vaccination       Relevant Orders   Pneumococcal conjugate vaccine 20-valent (Prevnar 20) (Completed)      Assessment & Plan       I am having Alicia Payne maintain her Omega-3 Fatty Acids (FISH OIL PO), Calcium  Citrate-Vitamin D  (CALCIUM  + D PO), AMBULATORY NON FORMULARY MEDICATION, Black Cohosh, diclofenac  sodium, Bioflex, vitamin C, atorvastatin , Vitamin D  (Ergocalciferol ), and escitalopram .  Meds ordered this encounter  Medications   escitalopram  (LEXAPRO ) 5 MG tablet    Sig:  Take 1 tablet (5 mg total) by mouth daily.    Dispense:  90 tablet    Refill:  1    Supervising Provider:   DOMENICA BLACKBIRD A [[id number]]

## 2024-08-19 NOTE — Assessment & Plan Note (Signed)
  Mild depressive disorder managed with Lexapro . Reports improvement in irritability and mood. Current dose is effective. - Continue Lexapro  at current dose. - Sent 90-day supply of Lexapro  to pharmacy.

## 2024-08-20 ENCOUNTER — Ambulatory Visit: Payer: Self-pay | Admitting: Family

## 2024-08-20 LAB — LIPID PANEL
Cholesterol: 177 mg/dL (ref 0–200)
HDL: 51.9 mg/dL (ref >39.00)
LDL Cholesterol: 87 mg/dL (ref 0–99)
NonHDL: 125.24
Total CHOL/HDL Ratio: 3
Triglycerides: 192 mg/dL — ABNORMAL HIGH (ref 0.0–149.0)
VLDL: 38.4 mg/dL (ref 0.0–40.0)

## 2024-08-20 LAB — TSH: TSH: 1.11 u[IU]/mL (ref 0.35–5.50)

## 2024-09-01 ENCOUNTER — Other Ambulatory Visit (HOSPITAL_COMMUNITY): Payer: Self-pay

## 2024-09-09 DIAGNOSIS — H524 Presbyopia: Secondary | ICD-10-CM | POA: Diagnosis not present

## 2024-10-05 ENCOUNTER — Other Ambulatory Visit: Payer: Self-pay

## 2024-10-05 ENCOUNTER — Other Ambulatory Visit (HOSPITAL_COMMUNITY): Payer: Self-pay

## 2024-10-08 ENCOUNTER — Other Ambulatory Visit (HOSPITAL_COMMUNITY): Payer: Self-pay
